# Patient Record
Sex: Male | Born: 1955 | Race: White | Hispanic: No | Smoking: Never smoker
Health system: Southern US, Community
[De-identification: ages and names within clinical notes are randomized; demographics above are authoritative.]

## PROBLEM LIST (undated history)

## (undated) DIAGNOSIS — Z87442 Personal history of urinary calculi: Secondary | ICD-10-CM

## (undated) DIAGNOSIS — E785 Hyperlipidemia, unspecified: Secondary | ICD-10-CM

## (undated) DIAGNOSIS — J449 Chronic obstructive pulmonary disease, unspecified: Secondary | ICD-10-CM

## (undated) DIAGNOSIS — I639 Cerebral infarction, unspecified: Secondary | ICD-10-CM

## (undated) DIAGNOSIS — R51 Headache: Secondary | ICD-10-CM

## (undated) DIAGNOSIS — R569 Unspecified convulsions: Secondary | ICD-10-CM

## (undated) DIAGNOSIS — I219 Acute myocardial infarction, unspecified: Secondary | ICD-10-CM

## (undated) DIAGNOSIS — I509 Heart failure, unspecified: Secondary | ICD-10-CM

## (undated) DIAGNOSIS — M1990 Unspecified osteoarthritis, unspecified site: Secondary | ICD-10-CM

## (undated) DIAGNOSIS — G4730 Sleep apnea, unspecified: Secondary | ICD-10-CM

## (undated) DIAGNOSIS — H9190 Unspecified hearing loss, unspecified ear: Secondary | ICD-10-CM

## (undated) DIAGNOSIS — K219 Gastro-esophageal reflux disease without esophagitis: Secondary | ICD-10-CM

## (undated) DIAGNOSIS — K279 Peptic ulcer, site unspecified, unspecified as acute or chronic, without hemorrhage or perforation: Secondary | ICD-10-CM

## (undated) DIAGNOSIS — D509 Iron deficiency anemia, unspecified: Secondary | ICD-10-CM

## (undated) DIAGNOSIS — I10 Essential (primary) hypertension: Secondary | ICD-10-CM

## (undated) DIAGNOSIS — R519 Headache, unspecified: Secondary | ICD-10-CM

## (undated) HISTORY — DX: Hyperlipidemia, unspecified: E78.5

## (undated) HISTORY — PX: KNEE SURGERY: SHX244

## (undated) HISTORY — PX: HERNIA REPAIR: SHX51

## (undated) HISTORY — PX: APPENDECTOMY: SHX54

## (undated) HISTORY — PX: FRACTURE SURGERY: SHX138

## (undated) HISTORY — DX: Chronic obstructive pulmonary disease, unspecified: J44.9

## (undated) HISTORY — DX: Peptic ulcer, site unspecified, unspecified as acute or chronic, without hemorrhage or perforation: K27.9

## (undated) HISTORY — PX: BRAIN SURGERY: SHX531

## (undated) HISTORY — DX: Iron deficiency anemia, unspecified: D50.9

## (undated) HISTORY — PX: ROTATOR CUFF REPAIR: SHX139

## (undated) HISTORY — PX: CEREBRAL ANEURYSM REPAIR: SHX164

---

## 2004-09-21 ENCOUNTER — Emergency Department (HOSPITAL_COMMUNITY): Admission: EM | Admit: 2004-09-21 | Discharge: 2004-09-21 | Payer: Self-pay | Admitting: Emergency Medicine

## 2004-09-26 ENCOUNTER — Ambulatory Visit (HOSPITAL_COMMUNITY): Admission: RE | Admit: 2004-09-26 | Discharge: 2004-09-26 | Payer: Self-pay | Admitting: *Deleted

## 2004-10-07 ENCOUNTER — Ambulatory Visit (HOSPITAL_COMMUNITY): Admission: RE | Admit: 2004-10-07 | Discharge: 2004-10-07 | Payer: Self-pay | Admitting: Orthopaedic Surgery

## 2004-10-14 ENCOUNTER — Ambulatory Visit (HOSPITAL_COMMUNITY): Admission: RE | Admit: 2004-10-14 | Discharge: 2004-10-14 | Payer: Self-pay | Admitting: Orthopaedic Surgery

## 2004-10-29 ENCOUNTER — Encounter (HOSPITAL_COMMUNITY): Admission: RE | Admit: 2004-10-29 | Discharge: 2004-11-28 | Payer: Self-pay | Admitting: *Deleted

## 2004-11-17 ENCOUNTER — Encounter (HOSPITAL_COMMUNITY): Admission: RE | Admit: 2004-11-17 | Discharge: 2004-12-17 | Payer: Self-pay | Admitting: *Deleted

## 2004-12-01 ENCOUNTER — Encounter (HOSPITAL_COMMUNITY): Admission: RE | Admit: 2004-12-01 | Discharge: 2005-01-01 | Payer: Self-pay | Admitting: Orthopaedic Surgery

## 2005-01-07 ENCOUNTER — Encounter (HOSPITAL_COMMUNITY): Admission: RE | Admit: 2005-01-07 | Discharge: 2005-02-06 | Payer: Self-pay | Admitting: Orthopaedic Surgery

## 2005-02-06 ENCOUNTER — Encounter (HOSPITAL_COMMUNITY): Admission: RE | Admit: 2005-02-06 | Discharge: 2005-03-08 | Payer: Self-pay | Admitting: Orthopaedic Surgery

## 2005-03-09 ENCOUNTER — Encounter (HOSPITAL_COMMUNITY): Admission: RE | Admit: 2005-03-09 | Discharge: 2005-04-08 | Payer: Self-pay | Admitting: Orthopaedic Surgery

## 2005-04-09 ENCOUNTER — Encounter (HOSPITAL_COMMUNITY): Admission: RE | Admit: 2005-04-09 | Discharge: 2005-05-09 | Payer: Self-pay | Admitting: Orthopaedic Surgery

## 2005-04-21 ENCOUNTER — Emergency Department (HOSPITAL_COMMUNITY): Admission: EM | Admit: 2005-04-21 | Discharge: 2005-04-21 | Payer: Self-pay | Admitting: Emergency Medicine

## 2005-05-29 ENCOUNTER — Ambulatory Visit (HOSPITAL_COMMUNITY): Admission: RE | Admit: 2005-05-29 | Discharge: 2005-05-29 | Payer: Self-pay | Admitting: Interventional Radiology

## 2005-06-10 ENCOUNTER — Ambulatory Visit (HOSPITAL_COMMUNITY): Admission: RE | Admit: 2005-06-10 | Discharge: 2005-06-10 | Payer: Self-pay | Admitting: Neurological Surgery

## 2005-07-26 ENCOUNTER — Emergency Department (HOSPITAL_COMMUNITY): Admission: EM | Admit: 2005-07-26 | Discharge: 2005-07-27 | Payer: Self-pay | Admitting: Emergency Medicine

## 2005-08-05 ENCOUNTER — Encounter (HOSPITAL_COMMUNITY): Admission: RE | Admit: 2005-08-05 | Discharge: 2005-09-04 | Payer: Self-pay | Admitting: Orthopaedic Surgery

## 2005-09-09 ENCOUNTER — Encounter (HOSPITAL_COMMUNITY): Admission: RE | Admit: 2005-09-09 | Discharge: 2005-10-03 | Payer: Self-pay | Admitting: Orthopaedic Surgery

## 2005-10-05 ENCOUNTER — Encounter (HOSPITAL_COMMUNITY): Admission: RE | Admit: 2005-10-05 | Discharge: 2005-11-04 | Payer: Self-pay | Admitting: Orthopaedic Surgery

## 2006-06-15 ENCOUNTER — Emergency Department (HOSPITAL_COMMUNITY): Admission: EM | Admit: 2006-06-15 | Discharge: 2006-06-15 | Payer: Self-pay | Admitting: Emergency Medicine

## 2006-06-21 ENCOUNTER — Encounter: Admission: RE | Admit: 2006-06-21 | Discharge: 2006-06-21 | Payer: Self-pay | Admitting: Neurological Surgery

## 2007-05-25 ENCOUNTER — Emergency Department (HOSPITAL_COMMUNITY): Admission: EM | Admit: 2007-05-25 | Discharge: 2007-05-26 | Payer: Self-pay | Admitting: Emergency Medicine

## 2009-07-07 ENCOUNTER — Emergency Department (HOSPITAL_COMMUNITY): Admission: EM | Admit: 2009-07-07 | Discharge: 2009-07-07 | Payer: Self-pay | Admitting: Emergency Medicine

## 2010-01-26 ENCOUNTER — Encounter: Payer: Self-pay | Admitting: Neurological Surgery

## 2010-05-23 NOTE — Op Note (Signed)
NAME:  Dustin Roberts, Dustin Roberts NO.:  [id number]   MEDICAL RECORD NO.:  [id number]          PATIENT TYPE:  AMB   LOCATION:  SDS                          FACILITY:  [facility]   PHYSICIAN:  Tennis Must Meyerdierks, M.D.DATE OF BIRTH:  1955/11/27   DATE OF PROCEDURE:  09/26/2004  DATE OF DISCHARGE:  09/26/2004                                 OPERATIVE REPORT   PREOP DIAGNOSIS:  Comminuted intra-articular fracture, left distal radius.   POSTOP DIAGNOSIS:  Comminuted intra-articular fracture, left distal radius.   PROCEDURE:  Open reduction internal fixation, left distal radius.   SURGEON:  Lowell Bouton, M.D.   ANESTHESIA:  General with axillary block.   OPERATIVE FINDINGS:  The patient had a comminuted fracture of the distal  radius that extended into the joint in multiple places.   PROCEDURE:  Under general anesthesia with a tourniquet on the left arm, the  left hand was prepped and draped in usual fashion.  After exsanguinating the  limb, the tourniquet was inflated to 250 mmHg. A longitudinal incision was  made over the volar radial aspect of the left wrist in line with the FCR  tendon. Sharp dissection was carried through the subcutaneous tissues and  bleeding points were coagulated. Sharp dissection was carried through the  FCR sheath and the tendon was retracted ulnarly. The floor of the sheath was  then incised longitudinally and blunt dissection was carried down to the FPL  tendon. This was retracted ulnarly with Weitlaner retractors. The pronator  quadratus was then incised longitudinally from its attachment on the radius.  It was elevated with a Glorious Peach off of the distal radius. The patient had been  placed in 10 pounds of traction at the end of the table and the fracture was  then reduced with the Therapist, nutritional. X-ray showed good position of the  fracture.  A DVR plate was then applied of the standard length but a wide  distal portion and the screws the  3.5 mm screw was placed in the slotted  hole. X-ray showed good length of the plate. The distal pegs were then  inserted using one threaded peg and the remainder none threaded. The  remainder of the proximal screws were then inserted in all the holes. The  traction had been released prior to the plate being applied and x-rays  showed excellent alignment of the fracture. The wound was then irrigated  copiously with saline and the pronator quadratus was reattached with 4-0  Vicryl.  A vessel loop drain was left in for drainage.  The  subcutaneous tissue was closed with 4-0 Vicryl and the skin with a 3-0  subcuticular Prolene. Steri-Strips were applied followed by sterile  dressings and a volar wrist splint. The patient tolerated the procedure well  and went to the recovery room awake and stable condition.      Lowell Bouton, M.D.  Electronically Signed     EMM/MEDQ  D:  09/26/2004  T:  09/27/2004  Job:  [id number]   cc:   Teola Bradley, M.D.  Fax: [phone]

## 2010-05-23 NOTE — Op Note (Signed)
NAMEMarland Kitchen  VIRLAN, KEMPKER NO.:  [id number]   MEDICAL RECORD NO.:  [id number]          PATIENT TYPE:  AMB   LOCATION:  SDS                          FACILITY:  [facility]   PHYSICIAN:  Dustin Roberts, M.D.DATE OF BIRTH:  04-13-1955   DATE OF PROCEDURE:  10/14/2004  DATE OF DISCHARGE:  10/14/2004                                 OPERATIVE REPORT   PREOPERATIVE DIAGNOSIS:  Left shoulder rotator cuff tear (supraspinatus and  subscapularis tendons).   POSTOPERATIVE DIAGNOSIS:  Left shoulder rotator cuff tear (supraspinatus and  subscapularis tendons).   PROCEDURE:  1.  Left shoulder open rotator cuff repair of supraspinatus and      subscapularis tendon tears.  2.  Left shoulder biceps tenotomy.   SURGEON:  Dustin Roberts, M.D.   ANESTHESIA:  1.  Intra scalene block, left shoulder,  2.  General anesthesia.   BLOOD LOSS:  Minimal.   COMPLICATIONS:  None.   ANTIBIOTICS:  1 gram IV Ancef.   INDICATIONS:  Dustin Roberts is a 55 year old gentleman who less than one month  ago fell off of a truck on the outstretched left wrist. He sustained a  comminuted distal radius fracture, that was fixed by one of my partners.  After the wrist was fixed, he noted that his shoulder was weak and was  exquisitely painful.  An MRI was obtained of the shoulder; it showed a full-  thickness supraspinatus tendon tear of left shoulder as well as a full-  thickness tear of the superior portion of the subscapularis tendon. There  was also noted medial subluxation of the long head of the biceps.  The MRI  then did not show any intraarticular pathology of the glenoid humeral joint  or the labrum.  Due to continued pain and weakness of the shoulder, I  recommended open rotator cuff repair, given the supraspinatus tendon then  the subscapularis tendon, as well.  We talked about the possibility of a  biceps tenodesis versus a tenotomy; and he, 54 years old, said that tenotomy  will  be fine with him. He was not interested he said in additional hardware  in his shoulder, and again I explained the risks, benefits of this to him.  These were well understood and agreed upon.   PROCEDURE DESCRIPTION:  After informed consent was obtained and appropriate  left arm and shoulder were marked, an interscalene block was obtained by  anesthesia and then Dustin Roberts brought to the operating room, placed supine  on the operating table. General anesthesia was obtained and then he was  maneuvered into a beach chair position; with appropriate straps and padding  to stabilize the head, as well as the nonoperative arm and the operative  arm.  I elected to not perform arthroscopy the shoulder, due to I do not  feel comfortable with pulling traction through his broken wrist; it was less  than a month old in terms of this fixation.  Given the fact there was no  glenohumeral pathology, I was going to proceed to the open portion of the  case.  The shoulder  was then prepped with DuraPrep and sterile drapes,  including sterile stockinette on the arm.  I then made an anterolateral  approach to the shoulder, starting at the level of the [facility] joint and carried  this just lateral.  The biceps muscle was exposed, and at the anterior  biceps between the anterior and lateral biceps, I did find a raphe and I was  able to take an interval down between these levels.  I made sure to not  extend the dissection past 5 cm on the lateral or the acromion to avoid the  axillary nerve.   Next, I assessed the undersurface of the acromion and the [facility] joint, and did  not find significant impingement of this area.  I did pass a small rasp up  underneath the acromion and the AC joint.  I flattened these areas out.  A  large bursal sac was noted as well, and  performed a bursectomy and then the  rotator cuff was assessed. There was a retracted large tear of the  supraspinatus, as well as the upper aspect of the  subscapularis tendon. The  could all be visualized through my dissection.  The biceps tendon was found  to be subluxated medially.  Due to gentle traction on the humerus, I was  able to open up the glenohumeral joint and could see the anchor of the long  head of biceps using a long blade.  I then took the biceps tendon off of the  sleeve, off of the superior aspect of the glenoid.  I then allowed this to  retract back down and then cut it additionally so it could retract without  any binding in the soft tissues.   Next, I cleaned an area on the lateral aspect of the shoulder, where the  supraspinatus tendon attachment was noted to be.  I cleaned this off with a  rasp; felt to get some good bleeding area.  I then prepared for a 5.5 mm  bioabsorbable screw placement with FiberWire suture.  This was first punched  with a hand drill, then tapped and the screw was placed without difficulty.  Using two separate strands of FiberWire, I went from back to front and then  did 2 separate horizontal mattress sutures in the supraspinatus tendon and  brought this over and to its footprint.  I then over sewed this with an  interrupted FiberWire suture, as well as a margin convergence suture between  the anterior and posterior portions of the cuff tear. There was then noted a  subscapularis tear; but it was not full length I was able to bring that back  over and repair that directly with a single drill hole and a FiberWire  suture as well. This was oversewn with FiberWire.  I put the shoulder  through a range of motion, and this was full the five then irrigated the  wound copiously and brought the biceps back over with a running loose 0  suture, followed by suture and subcutaneous tissue and staples on the skin.  Sterile dressing was applied; followed by a shoulder abduction sling. The  patient was awakened, extubated and taken to the recovery room in stable condition.  There were no complication's   I  will have him to continue to wear this sling, and we are going to process  of getting him in for physical therapy in the next a few days.           ______________________________  Dustin Panda.  Magnus Roberts, M.D.     CYB/MEDQ  D:  10/14/2004  T:  10/14/2004  Job:  [id number]

## 2010-10-23 LAB — CBC
HCT: 41.9
Hemoglobin: 14.7
MCHC: 35.1
MCV: 81.2
Platelets: 175
RBC: 5.16
RDW: 14.2 — ABNORMAL HIGH
WBC: 7.6

## 2010-10-23 LAB — BASIC METABOLIC PANEL
BUN: 20
CO2: 25
Calcium: 8.9
Chloride: 106
Creatinine, Ser: 0.85
GFR calc Af Amer: >60
GFR calc non Af Amer: >60
Glucose, Bld: 109 — ABNORMAL HIGH
Potassium: 3.7
Sodium: 136

## 2010-10-23 LAB — DIFFERENTIAL
Basophils Absolute: 0
Basophils Relative: 0
Eosinophils Absolute: 0.2
Eosinophils Relative: 3
Lymphocytes Relative: 10 — ABNORMAL LOW
Lymphs Abs: 0.7
Monocytes Absolute: 0.3
Monocytes Relative: 4
Neutro Abs: 6.3
Neutrophils Relative %: 83 — ABNORMAL HIGH

## 2010-10-23 LAB — URINALYSIS, ROUTINE W REFLEX MICROSCOPIC
Bilirubin Urine: NEGATIVE
Glucose, UA: NEGATIVE
Hgb urine dipstick: NEGATIVE
Ketones, ur: NEGATIVE
Nitrite: NEGATIVE
Protein, ur: NEGATIVE
Specific Gravity, Urine: >1.03 — ABNORMAL HIGH
Urobilinogen, UA: 0.2
pH: 5.5

## 2011-04-25 ENCOUNTER — Encounter (HOSPITAL_COMMUNITY): Payer: Self-pay | Admitting: *Deleted

## 2011-04-25 ENCOUNTER — Emergency Department (HOSPITAL_COMMUNITY): Payer: Medicaid Other

## 2011-04-25 ENCOUNTER — Emergency Department (HOSPITAL_COMMUNITY)
Admission: EM | Admit: 2011-04-25 | Discharge: 2011-04-25 | Disposition: A | Discharge status: Home / Self Care | Payer: Medicaid Other | Attending: Emergency Medicine | Admitting: Emergency Medicine

## 2011-04-25 DIAGNOSIS — E119 Type 2 diabetes mellitus without complications: Secondary | ICD-10-CM | POA: Insufficient documentation

## 2011-04-25 DIAGNOSIS — I509 Heart failure, unspecified: Secondary | ICD-10-CM | POA: Insufficient documentation

## 2011-04-25 DIAGNOSIS — R05 Cough: Secondary | ICD-10-CM | POA: Insufficient documentation

## 2011-04-25 DIAGNOSIS — T148XXA Other injury of unspecified body region, initial encounter: Secondary | ICD-10-CM | POA: Insufficient documentation

## 2011-04-25 DIAGNOSIS — M549 Dorsalgia, unspecified: Secondary | ICD-10-CM | POA: Insufficient documentation

## 2011-04-25 DIAGNOSIS — R059 Cough, unspecified: Secondary | ICD-10-CM | POA: Insufficient documentation

## 2011-04-25 DIAGNOSIS — R079 Chest pain, unspecified: Secondary | ICD-10-CM | POA: Insufficient documentation

## 2011-04-25 DIAGNOSIS — R509 Fever, unspecified: Secondary | ICD-10-CM | POA: Insufficient documentation

## 2011-04-25 DIAGNOSIS — J3489 Other specified disorders of nose and nasal sinuses: Secondary | ICD-10-CM | POA: Insufficient documentation

## 2011-04-25 DIAGNOSIS — Z8679 Personal history of other diseases of the circulatory system: Secondary | ICD-10-CM | POA: Insufficient documentation

## 2011-04-25 DIAGNOSIS — I10 Essential (primary) hypertension: Secondary | ICD-10-CM | POA: Insufficient documentation

## 2011-04-25 DIAGNOSIS — X58XXXA Exposure to other specified factors, initial encounter: Secondary | ICD-10-CM | POA: Insufficient documentation

## 2011-04-25 HISTORY — DX: Heart failure, unspecified: I50.9

## 2011-04-25 HISTORY — DX: Unspecified osteoarthritis, unspecified site: M19.90

## 2011-04-25 HISTORY — DX: Essential (primary) hypertension: I10

## 2011-04-25 HISTORY — DX: Cerebral infarction, unspecified: I63.9

## 2011-04-25 MED ORDER — HYDROCOD POLST-CHLORPHEN POLST 10-8 MG/5ML PO LQCR
5.0000 mL | Freq: Once | ORAL | Status: AC
  Administered 2011-04-25: 5 mL via ORAL
  Filled 2011-04-25: qty 5

## 2011-04-25 MED ORDER — IPRATROPIUM BROMIDE 0.02 % IN SOLN: 500.0000 ug | Freq: Four times a day (QID) | RESPIRATORY_TRACT | Status: DC

## 2011-04-25 MED ORDER — GUAIFENESIN-CODEINE 100-10 MG/5ML PO SYRP: ORAL_SOLUTION | ORAL | Status: DC

## 2011-04-25 MED ORDER — ALBUTEROL SULFATE (5 MG/ML) 0.5% IN NEBU
2.5000 mg | INHALATION_SOLUTION | Freq: Once | RESPIRATORY_TRACT | Status: AC
  Administered 2011-04-25: 2.5 mg via RESPIRATORY_TRACT
  Filled 2011-04-25: qty 0.5

## 2011-04-25 MED ORDER — IBUPROFEN 800 MG PO TABS
800.0000 mg | ORAL_TABLET | Freq: Once | ORAL | Status: AC
  Administered 2011-04-25: 800 mg via ORAL
  Filled 2011-04-25: qty 1

## 2011-04-25 MED ORDER — ALBUTEROL SULFATE (2.5 MG/3ML) 0.083% IN NEBU: 2.5000 mg | INHALATION_SOLUTION | Freq: Four times a day (QID) | RESPIRATORY_TRACT | Status: DC | PRN

## 2011-04-25 MED ORDER — IPRATROPIUM BROMIDE 0.02 % IN SOLN
0.5000 mg | Freq: Once | RESPIRATORY_TRACT | Status: AC
  Administered 2011-04-25: 0.5 mg via RESPIRATORY_TRACT
  Filled 2011-04-25: qty 2.5

## 2011-04-25 NOTE — ED Provider Notes (Signed)
History     CSN: [id number]  Arrival date & time 04/25/11  1141   First MD Initiated Contact with Patient 04/25/11 1207      Chief Complaint  Patient presents with  . Cough  . Nasal Congestion  . Back Pain    (Consider location/radiation/quality/duration/timing/severity/associated sxs/prior treatment) HPI Comments: States he's been coughing so much his chest and back hurt.  No leg swelling  Patient is a 56 y.o. male presenting with cough and back pain. The history is provided by the patient. No language interpreter was used.  Cough This is a new problem. The problem occurs every few minutes. The cough is non-productive. Maximum temperature: subjective fever and chills. Associated symptoms include chest pain and chills. Pertinent negatives include no sore throat, no shortness of breath and no wheezing. He is not a smoker.  Back Pain  Associated symptoms include chest pain and a fever.    Past Medical History  Diagnosis Date  . Arthritis   . CHF (congestive heart failure)   . Diabetes mellitus   . Hypertension   . Stroke     Past Surgical History  Procedure Date  . Appendectomy   . Fracture surgery     No family history on file.  History  Substance Use Topics  . Smoking status: Never Smoker   . Smokeless tobacco: Not on file  . Alcohol Use: No      Review of Systems  Constitutional: Positive for fever and chills.  HENT: Positive for congestion. Negative for sore throat.   Respiratory: Positive for cough. Negative for shortness of breath and wheezing.   Cardiovascular: Positive for chest pain. Negative for leg swelling.  Musculoskeletal: Positive for back pain.  All other systems reviewed and are negative.    Allergies  Review of patient's allergies indicates no known allergies.  Home Medications   Current Outpatient Rx  Name Route Sig Dispense Refill  . GUAIFENESIN-CODEINE 100-10 MG/5ML PO SYRP  10 mls q 4-6 hrs prn cough 240 mL 0    BP 131/68   Pulse 60  Temp(Src) 98 F (36.7 C) (Oral)  Resp 22  Ht 5\' 11"  (1.803 m)  Wt 220 lb (99.791 kg)  BMI 30.68 kg/m2  SpO2 97%  Physical Exam  Nursing note and vitals reviewed. Constitutional: He is oriented to person, place, and time. He appears well-developed and well-nourished.  HENT:  Head: Normocephalic and atraumatic.  Right Ear: External ear normal.  Left Ear: External ear normal.  Nose: Nose normal.  Eyes: EOM are normal.  Neck: Normal range of motion.  Cardiovascular: Normal rate, regular rhythm, S1 normal, S2 normal, normal heart sounds and intact distal pulses.  PMI is not displaced.  Exam reveals no distant heart sounds and no friction rub.   No murmur heard. Pulmonary/Chest: Effort normal and breath sounds normal. No accessory muscle usage. Not tachypneic. No respiratory distress. He has no decreased breath sounds. He has no wheezes. He has no rhonchi. He has no rales. He exhibits tenderness.    Abdominal: Soft. He exhibits no distension. There is no tenderness.  Musculoskeletal: Normal range of motion.       Arms: Neurological: He is alert and oriented to person, place, and time.  Skin: Skin is warm and dry.  Psychiatric: He has a normal mood and affect. Judgment normal.    ED Course  Procedures (including critical care time)  Labs Reviewed - No data to display Dg Chest 2 View  04/25/2011  *RADIOLOGY REPORT*  Clinical Data: 56 year old male with cough, congestion, fever, chest pain.  CHEST - 2 VIEW  Comparison: 05/25/2007.  Findings: Lung volumes are within normal limits.  Cardiac size and mediastinal contours are within normal limits.  Visualized tracheal air column is within normal limits.  No pneumothorax, pulmonary edema, pleural effusion or confluent pulmonary opacity. No acute osseous abnormality identified.  IMPRESSION: No acute cardiopulmonary abnormality.  Original Report Authenticated By: Harley Hallmark, M.D.     1. Cough   2. Muscle strain       MDM    X-rays are normal.  Doubt chf or other cardiac problems.  Doubt PE rx-robitussin AC OTC ibuprofen F/u with PCP in 2-3 days.        Worthy Rancher, PA 04/25/11 1359

## 2011-04-25 NOTE — ED Notes (Signed)
Pt DC to home with steady gait 

## 2011-04-25 NOTE — Discharge Instructions (Signed)
Cough, Adult  A cough is a reflex. It helps you clear your throat and airways. A cough can help heal your body. A cough can last 2 or 3 weeks (acute) or may last more than 8 weeks (chronic). Some common causes of a cough can include an infection, allergy, or a cold. HOME CARE  Only take medicine as told by your doctor.   If given, take your medicines (antibiotics) as told. Finish them even if you start to feel better.   Use a cold steam vaporizer or humidier in your home. This can help loosen thick spit (secretions).   Sleep so you are almost sitting up (semi-upright). Use pillows to do this. This helps reduce coughing.   Rest as needed.   Stop smoking if you smoke.  GET HELP RIGHT AWAY IF:  You have yellowish-white fluid (pus) in your thick spit.   Your cough gets worse.   Your medicine does not reduce coughing, and you are losing sleep.   You cough up blood.   You have trouble breathing.   Your pain gets worse and medicine does not help.   You have a fever.  MAKE SURE YOU:   Understand these instructions.   Will watch your condition.   Will get help right away if you are not doing well or get worse.  Document Released: 09/04/2010 Document Revised: 12/11/2010 Document Reviewed: 09/04/2010 [facility] Patient Information 2012 [place], [place].   Take the cough medicine as directed.  Take ibuprofen 800 mg every 8 hrs with food.  Follow up with your MD in 2-3 days.

## 2011-04-25 NOTE — ED Notes (Signed)
Pt taken to Xray.

## 2011-04-25 NOTE — ED Notes (Signed)
Pt presents with cough, chest wall pain with said cough, and low grade fever x 2 days. Pt denies SOB,

## 2011-04-27 NOTE — ED Provider Notes (Signed)
Medical screening examination/treatment/procedure(s) were performed by non-physician practitioner and as supervising physician I was immediately available for consultation/collaboration.  Deontae Robson, MD 04/27/11 0530 

## 2011-11-26 ENCOUNTER — Encounter (HOSPITAL_COMMUNITY): Payer: Self-pay | Admitting: Emergency Medicine

## 2011-11-26 ENCOUNTER — Emergency Department (HOSPITAL_COMMUNITY)
Admission: EM | Admit: 2011-11-26 | Discharge: 2011-11-26 | Disposition: A | Discharge status: Home / Self Care | Payer: Medicaid Other | Attending: Emergency Medicine | Admitting: Emergency Medicine

## 2011-11-26 DIAGNOSIS — M129 Arthropathy, unspecified: Secondary | ICD-10-CM | POA: Insufficient documentation

## 2011-11-26 DIAGNOSIS — E119 Type 2 diabetes mellitus without complications: Secondary | ICD-10-CM | POA: Insufficient documentation

## 2011-11-26 DIAGNOSIS — Z8673 Personal history of transient ischemic attack (TIA), and cerebral infarction without residual deficits: Secondary | ICD-10-CM | POA: Insufficient documentation

## 2011-11-26 DIAGNOSIS — Y929 Unspecified place or not applicable: Secondary | ICD-10-CM | POA: Insufficient documentation

## 2011-11-26 DIAGNOSIS — I10 Essential (primary) hypertension: Secondary | ICD-10-CM | POA: Insufficient documentation

## 2011-11-26 DIAGNOSIS — Y939 Activity, unspecified: Secondary | ICD-10-CM | POA: Insufficient documentation

## 2011-11-26 DIAGNOSIS — T1500XA Foreign body in cornea, unspecified eye, initial encounter: Secondary | ICD-10-CM | POA: Insufficient documentation

## 2011-11-26 DIAGNOSIS — H5789 Other specified disorders of eye and adnexa: Secondary | ICD-10-CM | POA: Insufficient documentation

## 2011-11-26 DIAGNOSIS — Z79899 Other long term (current) drug therapy: Secondary | ICD-10-CM | POA: Insufficient documentation

## 2011-11-26 DIAGNOSIS — I509 Heart failure, unspecified: Secondary | ICD-10-CM | POA: Insufficient documentation

## 2011-11-26 DIAGNOSIS — T1590XA Foreign body on external eye, part unspecified, unspecified eye, initial encounter: Secondary | ICD-10-CM | POA: Insufficient documentation

## 2011-11-26 DIAGNOSIS — T1501XA Foreign body in cornea, right eye, initial encounter: Secondary | ICD-10-CM

## 2011-11-26 DIAGNOSIS — H53149 Visual discomfort, unspecified: Secondary | ICD-10-CM | POA: Insufficient documentation

## 2011-11-26 MED ORDER — CIPROFLOXACIN HCL 0.3 % OP SOLN
1.0000 [drp] | OPHTHALMIC | Status: DC
  Administered 2011-11-26: 1 [drp] via OPHTHALMIC
  Filled 2011-11-26: qty 2.5

## 2011-11-26 MED ORDER — CIPROFLOXACIN HCL 0.3 % OP SOLN
OPHTHALMIC | Status: AC
  Filled 2011-11-26: qty 2.5

## 2011-11-26 MED ORDER — OXYCODONE-ACETAMINOPHEN 5-325 MG PO TABS: 1.0000 | ORAL_TABLET | ORAL | Status: DC | PRN

## 2011-11-26 MED ORDER — FLUORESCEIN SODIUM 1 MG OP STRP
ORAL_STRIP | OPHTHALMIC | Status: AC
  Administered 2011-11-26: 04:00:00
  Filled 2011-11-26: qty 1

## 2011-11-26 MED ORDER — TETRACAINE HCL 0.5 % OP SOLN
OPHTHALMIC | Status: AC
  Administered 2011-11-26: 04:00:00
  Filled 2011-11-26: qty 2

## 2011-11-26 NOTE — ED Provider Notes (Signed)
History     CSN: [id number]  Arrival date & time 11/26/11  0234   First MD Initiated Contact with Patient 11/26/11 [phone]      Chief Complaint  Patient presents with  . Eye Problem    (Consider location/radiation/quality/duration/timing/severity/associated sxs/prior treatment) HPI Comments: 56 year old male who states that he started having right eye pain last night. Over the course of the day he has had increased burning and sensitivity to light. This is persistent, gradually worsening, not associated with headache, blurred vision, fevers. States that he has been rubbing his eye but denies any foreign bodies, denies contact lenses  Patient is a 56 y.o. male presenting with eye problem. The history is provided by the patient.  Eye Problem  Associated symptoms include photophobia and eye redness.    Past Medical History  Diagnosis Date  . Arthritis   . CHF (congestive heart failure)   . Diabetes mellitus   . Hypertension   . Stroke     Past Surgical History  Procedure Date  . Appendectomy   . Fracture surgery     No family history on file.  History  Substance Use Topics  . Smoking status: Never Smoker   . Smokeless tobacco: Not on file  . Alcohol Use: No      Review of Systems  Constitutional: Negative for fever.  Eyes: Positive for photophobia, pain and redness.    Allergies  Review of patient's allergies indicates no known allergies.  Home Medications   Current Outpatient Rx  Name  Route  Sig  Dispense  Refill  . ALBUTEROL SULFATE (2.5 MG/3ML) 0.083% IN NEBU   Nebulization   Take 3 mLs (2.5 mg total) by nebulization every 6 (six) hours as needed for wheezing.   75 mL   1   . GUAIFENESIN-CODEINE 100-10 MG/5ML PO SYRP      10 mls q 4-6 hrs prn cough   240 mL   0   . IPRATROPIUM BROMIDE 0.02 % IN SOLN   Nebulization   Take 2.5 mLs (500 mcg total) by nebulization 4 (four) times daily.   75 mL   1   . OXYCODONE-ACETAMINOPHEN 5-325 MG PO TABS  Oral   Take 1 tablet by mouth every 4 (four) hours as needed for pain.   20 tablet   0     BP 139/97  Pulse 63  Temp 97.4 F (36.3 C) (Oral)  Resp 18  Ht 5' 11.5" (1.816 m)  Wt 217 lb (98.431 kg)  BMI 29.84 kg/m2  SpO2 97%  Physical Exam  Nursing note and vitals reviewed. Constitutional: He appears well-developed and well-nourished. No distress.  HENT:  Head: Normocephalic and atraumatic.  Eyes: EOM are normal. Pupils are equal, round, and reactive to light. Right eye exhibits no discharge. Left eye exhibits no discharge. No scleral icterus.       On fluorescein and tetracaine exam the patient has a foreign body in the right eye on the cornea nasal to the pupil. There is normal extraocular movements, no discharge, mild erythema of the conjunctiva. The intraocular pressure of the right eye is 14  Cardiovascular: Normal rate, regular rhythm and normal heart sounds.   Pulmonary/Chest: Effort normal and breath sounds normal.  Neurological: He is alert.  Skin: Skin is warm and dry.    ED Course  Procedures (including critical care time)  Labs Reviewed - No data to display No results found.   1. Foreign body of right cornea  MDM  Overall the patient is well-appearing, he has normal vital signs, he does have a foreign body in the right eye. After tetracaine this foreign body was unable to be removed with Q-tip or 25-gauge needle. He has been referred to ophthalmology as an outpatient to be seen tomorrow. Ciloxan drops given prior to discharge. She has expressed his understanding for the indications for return and the need for close followup.        Vida Roller, MD 11/26/11 [phone]

## 2011-11-26 NOTE — ED Notes (Signed)
States he started having right eye pain last night.  States he started having burning and sensitivity to light today.  States he is unable to keep his right eye open.

## 2013-08-09 ENCOUNTER — Other Ambulatory Visit (HOSPITAL_COMMUNITY): Payer: Self-pay

## 2013-08-09 DIAGNOSIS — R0683 Snoring: Secondary | ICD-10-CM

## 2013-08-09 DIAGNOSIS — G4730 Sleep apnea, unspecified: Secondary | ICD-10-CM

## 2013-08-20 ENCOUNTER — Ambulatory Visit: Payer: Medicaid Other | Attending: Family | Admitting: Sleep Medicine

## 2013-08-20 VITALS — Ht 71.0 in | Wt 216.0 lb

## 2013-08-20 DIAGNOSIS — G4730 Sleep apnea, unspecified: Secondary | ICD-10-CM

## 2013-08-20 DIAGNOSIS — G4733 Obstructive sleep apnea (adult) (pediatric): Secondary | ICD-10-CM | POA: Diagnosis not present

## 2013-08-20 DIAGNOSIS — R0683 Snoring: Secondary | ICD-10-CM

## 2013-08-20 DIAGNOSIS — G4710 Hypersomnia, unspecified: Secondary | ICD-10-CM | POA: Diagnosis present

## 2013-08-21 DIAGNOSIS — Q2730 Arteriovenous malformation, site unspecified: Secondary | ICD-10-CM | POA: Insufficient documentation

## 2013-08-28 NOTE — Sleep Study (Signed)
  [facility] Gilmore List A. Gerilyn Pilgrim, MD     [email or web address]        NOCTURNAL POLYSOMNOGRAM    LOCATION: SLEEP LAB FACILITY: [place]   PHYSICIAN: Sharonlee Nine A. Gerilyn Pilgrim, M.D.   DATE OF STUDY: 08/20/2013.   REFERRING PHYSICIAN: Herbert Deaner, FNP.  INDICATIONS: This is 58 year old who presents with marked hypersomnia, fatigue and snoring. There is also restless sleep.  MEDICATIONS:  Prior to Admission medications   Medication Sig Start Date End Date Taking? Authorizing Provider  albuterol (PROVENTIL) (2.5 MG/3ML) 0.083% nebulizer solution Take 3 mLs (2.5 mg total) by nebulization every 6 (six) hours as needed for wheezing. 04/25/11 04/24/12  Evalina Field, PA-C  guaiFENesin-codeine [facility]) 100-10 MG/5ML syrup 10 mls q 4-6 hrs prn cough 04/25/11   Richard Hyacinth Meeker, PA-C  ipratropium (ATROVENT) 0.02 % nebulizer solution Take 2.5 mLs (500 mcg total) by nebulization 4 (four) times daily. 04/25/11 04/24/12  Evalina Field, PA-C  oxyCODONE-acetaminophen (PERCOCET) 5-325 MG per tablet Take 1 tablet by mouth every 4 (four) hours as needed for pain. 11/26/11   Vida Roller, MD      EPWORTH SLEEPINESS SCALE: 16.   BMI: 30.   ARCHITECTURAL SUMMARY: The study had to be discontinued about 80% of the typical completion time because the patient developed significant nausea and wanted the study discontinued. Total recording time was 251 minutes. Sleep efficiency 85 %. Sleep latency 13 minutes. REM latency 57 minutes. Stage NI 9 %, N2 47 % and N3 32 % and REM sleep 12 %.    RESPIRATORY DATA:  Baseline oxygen saturation is 94 %. The lowest saturation is 86 %. The diagnostic AHI is 8. The RDI is 12. The REM AHI is 9.  LIMB MOVEMENT SUMMARY: PLM index 11.   ELECTROCARDIOGRAM SUMMARY: Average heart rate is 66 with no significant dysrhythmias observed.   IMPRESSION:  1. This study is somewhat limited as only about 80% of the typical recording time was obtained. However, what is  obtained demonstrates mild obstructive sleep apnea syndrome that required positive pressure treatment. 2. 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  Thanks for this referral.  Marylon Verno A. Gerilyn Pilgrim, M.D. Diplomat, Biomedical engineer of Sleep Medicine.

## 2014-06-27 ENCOUNTER — Encounter: Payer: Self-pay | Admitting: Internal Medicine

## 2014-07-10 ENCOUNTER — Encounter: Payer: Self-pay | Admitting: Gastroenterology

## 2014-07-20 ENCOUNTER — Ambulatory Visit: Payer: Medicaid Other | Admitting: Gastroenterology

## 2014-08-01 ENCOUNTER — Encounter: Payer: Self-pay | Admitting: Gastroenterology

## 2014-08-01 ENCOUNTER — Telehealth: Payer: Self-pay | Admitting: Gastroenterology

## 2014-08-01 ENCOUNTER — Ambulatory Visit: Payer: Medicaid Other | Admitting: Gastroenterology

## 2014-08-01 NOTE — Telephone Encounter (Signed)
PATIENT WAS A NO SHOW AND LETTER SENT  °

## 2014-12-19 ENCOUNTER — Encounter (INDEPENDENT_AMBULATORY_CARE_PROVIDER_SITE_OTHER): Payer: Self-pay | Admitting: *Deleted

## 2015-01-11 ENCOUNTER — Emergency Department (HOSPITAL_COMMUNITY)
Admission: EM | Admit: 2015-01-11 | Discharge: 2015-01-11 | Disposition: A | Discharge status: Home / Self Care | Payer: Medicaid Other | Attending: Emergency Medicine | Admitting: Emergency Medicine

## 2015-01-11 ENCOUNTER — Encounter (HOSPITAL_COMMUNITY): Payer: Self-pay | Admitting: *Deleted

## 2015-01-11 DIAGNOSIS — Z8673 Personal history of transient ischemic attack (TIA), and cerebral infarction without residual deficits: Secondary | ICD-10-CM | POA: Insufficient documentation

## 2015-01-11 DIAGNOSIS — Z79899 Other long term (current) drug therapy: Secondary | ICD-10-CM | POA: Insufficient documentation

## 2015-01-11 DIAGNOSIS — Z8739 Personal history of other diseases of the musculoskeletal system and connective tissue: Secondary | ICD-10-CM | POA: Diagnosis not present

## 2015-01-11 DIAGNOSIS — H9211 Otorrhea, right ear: Secondary | ICD-10-CM | POA: Diagnosis present

## 2015-01-11 DIAGNOSIS — H7291 Unspecified perforation of tympanic membrane, right ear: Secondary | ICD-10-CM

## 2015-01-11 DIAGNOSIS — I509 Heart failure, unspecified: Secondary | ICD-10-CM | POA: Diagnosis not present

## 2015-01-11 DIAGNOSIS — Z7984 Long term (current) use of oral hypoglycemic drugs: Secondary | ICD-10-CM | POA: Diagnosis not present

## 2015-01-11 DIAGNOSIS — Z794 Long term (current) use of insulin: Secondary | ICD-10-CM | POA: Insufficient documentation

## 2015-01-11 DIAGNOSIS — I10 Essential (primary) hypertension: Secondary | ICD-10-CM | POA: Insufficient documentation

## 2015-01-11 DIAGNOSIS — E119 Type 2 diabetes mellitus without complications: Secondary | ICD-10-CM | POA: Insufficient documentation

## 2015-01-11 MED ORDER — AMOXICILLIN 500 MG PO CAPS: 500.0000 mg | ORAL_CAPSULE | Freq: Two times a day (BID) | ORAL | Status: DC

## 2015-01-11 MED ORDER — OXYCODONE-ACETAMINOPHEN 5-325 MG PO TABS
1.0000 | ORAL_TABLET | Freq: Once | ORAL | Status: AC
  Administered 2015-01-11: 1 via ORAL

## 2015-01-11 MED ORDER — OXYCODONE-ACETAMINOPHEN 5-325 MG PO TABS
ORAL_TABLET | ORAL | Status: AC
  Filled 2015-01-11: qty 1

## 2015-01-11 MED ORDER — AMOXICILLIN 250 MG PO CAPS
500.0000 mg | ORAL_CAPSULE | Freq: Once | ORAL | Status: AC
  Administered 2015-01-11: 500 mg via ORAL
  Filled 2015-01-11: qty 2

## 2015-01-11 MED ORDER — IBUPROFEN 400 MG PO TABS
600.0000 mg | ORAL_TABLET | Freq: Once | ORAL | Status: AC
  Administered 2015-01-11: 600 mg via ORAL
  Filled 2015-01-11: qty 2

## 2015-01-11 NOTE — ED Notes (Addendum)
Pt c/o cough, congestion, and sneezing x 2 weeks. Pt also c/o right ear draining since yesterday that has been accompanied with chills and low grade fever. Pt states he can't hear anything out of his right ear.

## 2015-01-11 NOTE — ED Provider Notes (Signed)
CSN: [id number]     Arrival date & time 01/11/15  2000 History   First MD Initiated Contact with Patient 01/11/15 2022     Chief Complaint  Patient presents with  . Ear Drainage      HPI Patient has had upper rest or symptoms of the past 2 weeks and he noted that yesterday began having some drainage from his right ear.  He's had some chills without fever.  He reports decreased hearing in his right ear.  He denies injury or trauma to his right ear.  He denies having ear pain prior to the drainage beginning.  He denies pain -0.  No headaches.   Past Medical History  Diagnosis Date  . Arthritis   . CHF (congestive heart failure) (HCC)   . Diabetes mellitus   . Hypertension   . Stroke [facility])    Past Surgical History  Procedure Laterality Date  . Appendectomy    . Fracture surgery      left wrist  . Brain surgery     No family history on file. Social History  Substance Use Topics  . Smoking status: Never Smoker   . Smokeless tobacco: None  . Alcohol Use: No    Review of Systems  All other systems reviewed and are negative.     Allergies  Review of patient's allergies indicates no known allergies.  Home Medications   Prior to Admission medications   Medication Sig Start Date End Date Taking? Authorizing Provider  ipratropium (ATROVENT) 0.02 % nebulizer solution Take 2.5 mLs (500 mcg total) by nebulization 4 (four) times daily. 04/25/11 01/11/15 Yes Richard Paul Half, PA-C  ADVAIR DISKUS 250-50 MCG/DOSE AEPB Inhale 1 puff into the lungs 2 (two) times daily. 12/17/14   Historical Provider, MD  albuterol (PROVENTIL) (2.5 MG/3ML) 0.083% nebulizer solution Take 3 mLs (2.5 mg total) by nebulization every 6 (six) hours as needed for wheezing. 04/25/11 04/24/12  Worthy Rancher, PA-C  amoxicillin (AMOXIL) 500 MG capsule Take 1 capsule (500 mg total) by mouth 2 (two) times daily. 01/11/15   Azalia Bilis, MD  LANTUS 100 UNIT/ML injection Inject 50 Units into the skin daily. 12/17/14    Historical Provider, MD  lisinopril (PRINIVIL,ZESTRIL) 40 MG tablet Take 40 mg by mouth daily. 12/17/14   Historical Provider, MD  metFORMIN (GLUCOPHAGE) 1000 MG tablet Take 1,000 mg by mouth 2 (two) times daily. 01/01/15   Historical Provider, MD  NOVOLOG 100 UNIT/ML injection INJECT 9 UNITS WITH MEALS SQ TID 12/17/14   Historical Provider, MD  pantoprazole (PROTONIX) 40 MG tablet Take 40 mg by mouth daily. 01/01/15   Historical Provider, MD  simvastatin (ZOCOR) 40 MG tablet Take 40 mg by mouth daily. 01/01/15   Historical Provider, MD   BP 165/82 mmHg  Pulse 94  Temp(Src) 99.4 F (37.4 C) (Oral)  Resp 20  Ht 5\' 11"  (1.803 m)  Wt 227 lb (102.967 kg)  BMI 31.67 kg/m2  SpO2 97% Physical Exam  Constitutional: He is oriented to person, place, and time. He appears well-developed and well-nourished.  HENT:  Head: Normocephalic.  Left TM is normal.  Rupture of his right TM.  Right external auditory canal is without swelling or redness.  No mastoid tenderness.  There is crusted drainage on the outside of his right ear which may have represented purulent drainage.  Eyes: EOM are normal.  Neck: Normal range of motion.  Pulmonary/Chest: Effort normal.  Abdominal: He exhibits no distension.  Musculoskeletal: Normal range of  motion.  Neurological: He is alert and oriented to person, place, and time.  Psychiatric: He has a normal mood and affect.  Nursing note and vitals reviewed.   ED Course  Procedures (including critical care time) Labs Review Labs Reviewed - No data to display  Imaging Review No results found. I have personally reviewed and evaluated these images and lab results as part of my medical decision-making.   EKG Interpretation None      MDM   Final diagnoses:  Ruptured tympanic membrane, right    Patient be started on amoxicillin.  No signs of mastoiditis at this time.  Nontoxic-appearing.  I suspect he had a developing right ear infection which now has ruptured.   He's been given ruptured TM precautions.  ENT follow-up.  He understands to not submerge his right ear and to wear an earplug when showering.    Azalia BilisKevin Riane Rung, MD 01/11/15 2042

## 2015-01-11 NOTE — Discharge Instructions (Signed)
Eardrum Perforation  An eardrum perforation is a puncture or tear in the eardrum. This is also called a ruptured eardrum. The eardrum is a thin, round tissue inside of your ear that separates your ear canal from your middle ear. This is the tissue that detects sound and enables you to hear. An eardrum perforation can cause discomfort and hearing loss. In most cases, the eardrum will heal without treatment and with little or no permanent hearing loss.  CAUSES  An eardrum perforation can result from different causes, including:  · Sudden pressure changes that happen in situations such as scuba diving or flying in an airplane.  · Foreign objects in the ear.  · Inserting a cotton-tipped swab or any blunt object into the ear.  · Loud noise.  · Trauma to the ear.  · Attempting to remove an object from the ear.  SIGNS AND SYMPTOMS  · Hearing loss.  · Ear pain.  · Ringing in the ear.  · Discharge or bleeding from the ear.  · Dizziness.  · Vomiting.  · Facial paralysis.  DIAGNOSIS   Your health care provider will examine your ear using a tool called an otoscope. This tool allows the health care provider to see into your ear to examine your eardrum. Various types of hearing tests may also be done.  TREATMENT   Typically, the eardrum will heal on its own within a few weeks. If your eardrum does not heal, your health care provider may recommend one of the following treatments:  · A procedure to place a patch over your eardrum.  · Surgery to repair your eardrum.  HOME CARE INSTRUCTIONS   · Keep your ear dry. This will improve healing. Do not submerge your head under water until healing is complete. Do not swim or dive until your health care provider approves. While bathing or showering, protect your ear using one of these methods:    Using a waterproof earplug.    Covering a piece of cotton with petroleum jelly and placing it in your outer ear canal.  · Take medicines only as directed by your health care provider.  · Avoid  blowing your nose if possible. If you blow your nose, do it gently. Forceful blowing increases the pressure in your middle ear. This may cause further injury or may delay your healing.  · Resume your normal activities after the perforation has healed. Your health care provider can tell you when this has occurred.  · Talk to your health care provider before you fly on an airplane. Air travel is generally allowed with a perforated eardrum.  · Keep all follow-up visits as directed by your health care provider. This is important.  SEEK MEDICAL CARE IF:  · You have a fever.  SEEK IMMEDIATE MEDICAL CARE IF:  · You have blood or pus coming from your ear.  · You have dizziness or problems with balance.  · You have nausea or vomiting.  · You have increased pain.     This information is not intended to replace advice given to you by your health care provider. Make sure you discuss any questions you have with your health care provider.     Document Released: 12/20/1999 Document Revised: 01/12/2014 Document Reviewed: 07/31/2013  Elsevier Interactive Patient Education ©2016 [employer]

## 2015-02-11 ENCOUNTER — Emergency Department (HOSPITAL_COMMUNITY)
Admission: EM | Admit: 2015-02-11 | Discharge: 2015-02-12 | Disposition: A | Discharge status: Home / Self Care | Payer: Medicaid Other | Attending: Emergency Medicine | Admitting: Emergency Medicine

## 2015-02-11 ENCOUNTER — Emergency Department (HOSPITAL_COMMUNITY): Payer: Medicaid Other

## 2015-02-11 ENCOUNTER — Encounter (HOSPITAL_COMMUNITY): Payer: Self-pay | Admitting: Emergency Medicine

## 2015-02-11 DIAGNOSIS — Z7984 Long term (current) use of oral hypoglycemic drugs: Secondary | ICD-10-CM | POA: Insufficient documentation

## 2015-02-11 DIAGNOSIS — Z8673 Personal history of transient ischemic attack (TIA), and cerebral infarction without residual deficits: Secondary | ICD-10-CM | POA: Diagnosis not present

## 2015-02-11 DIAGNOSIS — E785 Hyperlipidemia, unspecified: Secondary | ICD-10-CM | POA: Diagnosis not present

## 2015-02-11 DIAGNOSIS — J159 Unspecified bacterial pneumonia: Secondary | ICD-10-CM | POA: Diagnosis not present

## 2015-02-11 DIAGNOSIS — M1990 Unspecified osteoarthritis, unspecified site: Secondary | ICD-10-CM | POA: Insufficient documentation

## 2015-02-11 DIAGNOSIS — Z79899 Other long term (current) drug therapy: Secondary | ICD-10-CM | POA: Diagnosis not present

## 2015-02-11 DIAGNOSIS — H9190 Unspecified hearing loss, unspecified ear: Secondary | ICD-10-CM | POA: Diagnosis not present

## 2015-02-11 DIAGNOSIS — I509 Heart failure, unspecified: Secondary | ICD-10-CM | POA: Insufficient documentation

## 2015-02-11 DIAGNOSIS — H9201 Otalgia, right ear: Secondary | ICD-10-CM | POA: Insufficient documentation

## 2015-02-11 DIAGNOSIS — R509 Fever, unspecified: Secondary | ICD-10-CM | POA: Diagnosis present

## 2015-02-11 DIAGNOSIS — R197 Diarrhea, unspecified: Secondary | ICD-10-CM | POA: Diagnosis not present

## 2015-02-11 DIAGNOSIS — E119 Type 2 diabetes mellitus without complications: Secondary | ICD-10-CM | POA: Insufficient documentation

## 2015-02-11 DIAGNOSIS — I10 Essential (primary) hypertension: Secondary | ICD-10-CM | POA: Diagnosis not present

## 2015-02-11 DIAGNOSIS — Z794 Long term (current) use of insulin: Secondary | ICD-10-CM | POA: Diagnosis not present

## 2015-02-11 DIAGNOSIS — J189 Pneumonia, unspecified organism: Secondary | ICD-10-CM

## 2015-02-11 LAB — COMPREHENSIVE METABOLIC PANEL
ALBUMIN: 3.9 g/dL (ref 3.5–5.0)
ALT: 41 U/L (ref 17–63)
AST: 36 U/L (ref 15–41)
Alkaline Phosphatase: 77 U/L (ref 38–126)
Anion gap: 11 (ref 5–15)
BUN: 20 mg/dL (ref 6–20)
CHLORIDE: 103 mmol/L (ref 101–111)
CO2: 22 mmol/L (ref 22–32)
CREATININE: 0.99 mg/dL (ref 0.61–1.24)
Calcium: 9.4 mg/dL (ref 8.9–10.3)
GFR calc Af Amer: >60 mL/min (ref >60)
GLUCOSE: 171 mg/dL — AB (ref 65–99)
POTASSIUM: 4.1 mmol/L (ref 3.5–5.1)
Sodium: 136 mmol/L (ref 135–145)
Total Bilirubin: 0.7 mg/dL (ref 0.3–1.2)
Total Protein: 7.7 g/dL (ref 6.5–8.1)

## 2015-02-11 LAB — CBC WITH DIFFERENTIAL/PLATELET
Basophils Absolute: 0 10*3/uL (ref 0.0–0.1)
Basophils Relative: 1 %
EOS ABS: 0 10*3/uL (ref 0.0–0.7)
EOS PCT: 0 %
HCT: 38.2 % — ABNORMAL LOW (ref 39.0–52.0)
Hemoglobin: 12.5 g/dL — ABNORMAL LOW (ref 13.0–17.0)
LYMPHS ABS: 1.6 10*3/uL (ref 0.7–4.0)
LYMPHS PCT: 20 %
MCH: 25.7 pg — AB (ref 26.0–34.0)
MCHC: 32.7 g/dL (ref 30.0–36.0)
MCV: 78.6 fL (ref 78.0–100.0)
MONO ABS: 0.8 10*3/uL (ref 0.1–1.0)
MONOS PCT: 11 %
Neutro Abs: 5.4 10*3/uL (ref 1.7–7.7)
Neutrophils Relative %: 68 %
PLATELETS: 170 10*3/uL (ref 150–400)
RBC: 4.86 MIL/uL (ref 4.22–5.81)
RDW: 13.4 % (ref 11.5–15.5)
WBC: 7.8 10*3/uL (ref 4.0–10.5)

## 2015-02-11 LAB — URINALYSIS, ROUTINE W REFLEX MICROSCOPIC
GLUCOSE, UA: NEGATIVE mg/dL
HGB URINE DIPSTICK: NEGATIVE
Ketones, ur: NEGATIVE mg/dL
LEUKOCYTES UA: NEGATIVE
Nitrite: NEGATIVE
PH: 5.5 (ref 5.0–8.0)
Protein, ur: 30 mg/dL — AB

## 2015-02-11 LAB — LACTIC ACID, PLASMA: LACTIC ACID, VENOUS: 1.3 mmol/L (ref 0.5–2.0)

## 2015-02-11 LAB — URINE MICROSCOPIC-ADD ON

## 2015-02-11 MED ORDER — ACETAMINOPHEN 500 MG PO TABS
1000.0000 mg | ORAL_TABLET | Freq: Once | ORAL | Status: AC
  Administered 2015-02-11: 1000 mg via ORAL
  Filled 2015-02-11: qty 2

## 2015-02-11 NOTE — ED Provider Notes (Signed)
CSN: [id number]     Arrival date & time 02/11/15  2029 History  By signing my name below, I, [facility], attest that this documentation has been prepared under the direction and in the presence of Devoria Albe, MD at 2307. Electronically Signed: Randell Patient, ED Scribe. 02/11/2015. 2:34 AM.   Chief Complaint  Patient presents with  . Fever   The history is provided by the patient. No language interpreter was used.   HPI Comments: Dustin Roberts is a 60 y.o. male with an PMHx of stroke, DM, HTN, HLN, and CHF and a PSHx of brain surgery who presents to the Emergency Department complaining of a constant, moderate, unchanging fever with a TMAX 102.3 onset 2 yesterday. Patient endorses associated fevers, chills, mild dry cough for 1 day, diaphoresis, diarrhea (once PTA), body aches, right ear pain, and HA to left occipitus. He reports similar HAs in the past but states current episode is greater in severity. He notes recent sick contact with granddaughter who was diagnosed with URI and had a similar cough. Per pt, he was seen 6 weeks ago where he was diagnosed with a ruptured eardrum and had umbilical and left inguinal hernia repair 1 month ago. Denies smoking and EtOH use. Denies rhinorrhea, sore throat, CP, abdominal pain, nausea, vomiting, frequency, and dysuria patient states he did have a flu shot this season.  PCP: [facility]  Past Medical History  Diagnosis Date  . Arthritis   . CHF (congestive heart failure) (HCC)   . Diabetes mellitus   . Hypertension   . Stroke [facility])    Past Surgical History  Procedure Laterality Date  . Appendectomy    . Fracture surgery      left wrist  . Brain surgery     History reviewed. No pertinent family history. Social History  Substance Use Topics  . Smoking status: Never Smoker   . Smokeless tobacco: None  . 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  HENT: Positive for ear pain. Negative for rhinorrhea and sore throat.   Cardiovascular: Negative for chest pain.  Gastrointestinal: Positive for diarrhea. Negative for nausea, vomiting and abdominal pain.  Genitourinary: Negative for dysuria and frequency.  Neurological: Positive for headaches.  All other systems reviewed and are negative.     Allergies  Review of patient's allergies indicates no known allergies.  Home Medications   Prior to Admission medications   Medication Sig Start Date End Date Taking? Authorizing Provider  acetaminophen (TYLENOL) 500 MG tablet Take 500 mg by mouth every 6 (six) hours as needed for mild pain or moderate pain.   Yes Historical Provider, MD  ADVAIR DISKUS 250-50 MCG/DOSE AEPB Inhale 1 puff into the lungs 2 (two) times daily. 12/17/14  Yes Historical Provider, MD  insulin aspart (NOVOLOG FLEXPEN) 100 UNIT/ML FlexPen Inject 10 Units into the skin 3 (three) times daily before meals.   Yes Historical Provider, MD  Insulin Glargine (LANTUS SOLOSTAR) 100 UNIT/ML Solostar Pen Inject 50 Units into the skin every morning.   Yes Historical Provider, MD  lisinopril (PRINIVIL,ZESTRIL) 40 MG tablet Take 40 mg by mouth every evening.  12/17/14  Yes Historical Provider, MD  loratadine (ALLERGY RELIEF) 10 MG tablet Take 10 mg by mouth daily.  Yes Historical Provider, MD  metFORMIN (GLUCOPHAGE) 1000 MG tablet Take 1,000 mg by mouth 2 (two) times daily. 01/01/15  Yes Historical Provider, MD  pantoprazole (PROTONIX) 40 MG tablet Take 40 mg by mouth every evening.  01/01/15  Yes Historical Provider, MD  simvastatin (ZOCOR) 40 MG tablet Take 40 mg by mouth every evening.  01/01/15  Yes Historical Provider, MD  albuterol (PROVENTIL) (2.5 MG/3ML) 0.083% nebulizer solution Take 3 mLs (2.5 mg total) by nebulization every 6  (six) hours as needed for wheezing. Patient not taking: Reported on 02/11/2015 04/25/11 04/24/12  Worthy Rancher, PA-C  amoxicillin (AMOXIL) 500 MG capsule Take 1 capsule (500 mg total) by mouth 2 (two) times daily. Patient not taking: Reported on 02/11/2015 01/11/15   Azalia Bilis, MD  azithromycin (ZITHROMAX) 250 MG tablet Take 1 tablet (250 mg total) by mouth daily. 02/12/15   Devoria Albe, MD  ipratropium (ATROVENT) 0.02 % nebulizer solution Take 2.5 mLs (500 mcg total) by nebulization 4 (four) times daily. 04/25/11 01/11/15  Richard Paul Half, PA-C   BP 124/75 mmHg  Pulse 88  Temp(Src) 99.5 F (37.5 C) (Oral)  Resp 18  Ht 5' 11.5" (1.816 m)  Wt 170 lb (77.111 kg)  BMI 23.38 kg/m2  SpO2 95%  Vital signs normal   Physical Exam  Constitutional: He is oriented to person, place, and time. He appears well-developed and well-nourished.  Non-toxic appearance. He does not appear ill. No distress.  Hard of hearing  HENT:  Head: Normocephalic and atraumatic.  Right Ear: External ear normal.  Left Ear: External ear normal.  Nose: Nose normal. No mucosal edema or rhinorrhea.  Mouth/Throat: Oropharynx is clear and moist and mucous membranes are normal. No dental abscesses or uvula swelling.  Right ear TM normal. Do not see evidence of right TM rupture that pt states he had last month. Ear canal is normal.  Eyes: Conjunctivae and EOM are normal. Pupils are equal, round, and reactive to light.  Neck: Normal range of motion and full passive range of motion without pain. Neck supple.  Cardiovascular: Normal rate, regular rhythm and normal heart sounds.  Exam reveals no gallop and no friction rub.   No murmur heard. Pulmonary/Chest: Effort normal and breath sounds normal. No respiratory distress. He has no wheezes. He has no rhonchi. He has no rales. He exhibits no tenderness and no crepitus.  Abdominal: Soft. Normal appearance and bowel sounds are normal. He exhibits no distension. There is no tenderness.  There is no rebound and no guarding.  Musculoskeletal: Normal range of motion. He exhibits no edema or tenderness.  Moves all extremities well.   Neurological: He is alert and oriented to person, place, and time. He has normal strength. No cranial nerve deficit.  Skin: Skin is warm, dry and intact. No rash noted. No erythema. No pallor.  Feels hot.  Psychiatric: He has a normal mood and affect. His speech is normal and behavior is normal. His mood appears not anxious.  Nursing note and vitals reviewed.   ED Course  Procedures   Medications  azithromycin (ZITHROMAX) 500 mg in dextrose 5 % 250 mL IVPB (0 mg Intravenous Stopped 02/12/15 0135)  acetaminophen (TYLENOL) tablet 1,000 mg (1,000 mg Oral Given 02/11/15 2321)  cefTRIAXone (ROCEPHIN) 1 g in dextrose 5 % 50 mL IVPB (0 g Intravenous Stopped 02/12/15 0224)     DIAGNOSTIC STUDIES: Oxygen Saturation is 94% on RA, adequate by my interpretation.    COORDINATION OF CARE: 11:01 PM Will  order chest x-ray, labs, and medications. Discussed treatment plan with pt at bedside and pt agreed to plan.  Patient developed a low-grade fever which was checked after my exam at 100.3. He was treated with Tylenol.  After reviewing patient's chest x-ray he was given IV Rocephin and Zithromax for his community-acquired pneumonia.  Patient was ambulated by nursing staff and his pulse ox remained 95-99%.  Recheck at time of discharge after patient received his IV antibiotics, patient states he's feeling better, he feels able to go home and take oral antibiotics.  Labs Review Results for orders placed or performed during the hospital encounter of 02/11/15  Comprehensive metabolic panel  Result Value Ref Range   Sodium 136 135 - 145 mmol/L   Potassium 4.1 3.5 - 5.1 mmol/L   Chloride 103 101 - 111 mmol/L   CO2 22 22 - 32 mmol/L   Glucose, Bld 171 (H) 65 - 99 mg/dL   BUN 20 6 - 20 mg/dL   Creatinine, Ser 1.61 0.61 - 1.24 mg/dL   Calcium 9.4 8.9 - 09.6  mg/dL   Total Protein 7.7 6.5 - 8.1 g/dL   Albumin 3.9 3.5 - 5.0 g/dL   AST 36 15 - 41 U/L   ALT 41 17 - 63 U/L   Alkaline Phosphatase 77 38 - 126 U/L   Total Bilirubin 0.7 0.3 - 1.2 mg/dL   GFR calc non Af Amer >60 >60 mL/min   GFR calc Af Amer >60 >60 mL/min   Anion gap 11 5 - 15  CBC with Differential  Result Value Ref Range   WBC 7.8 4.0 - 10.5 K/uL   RBC 4.86 4.22 - 5.81 MIL/uL   Hemoglobin 12.5 (L) 13.0 - 17.0 g/dL   HCT 04.5 (L) 40.9 - 81.1 %   MCV 78.6 78.0 - 100.0 fL   MCH 25.7 (L) 26.0 - 34.0 pg   MCHC 32.7 30.0 - 36.0 g/dL   RDW 91.4 78.2 - 95.6 %   Platelets 170 150 - 400 K/uL   Neutrophils Relative % 68 %   Neutro Abs 5.4 1.7 - 7.7 K/uL   Lymphocytes Relative 20 %   Lymphs Abs 1.6 0.7 - 4.0 K/uL   Monocytes Relative 11 %   Monocytes Absolute 0.8 0.1 - 1.0 K/uL   Eosinophils Relative 0 %   Eosinophils Absolute 0.0 0.0 - 0.7 K/uL   Basophils Relative 1 %   Basophils Absolute 0.0 0.0 - 0.1 K/uL  Lactic acid, plasma  Result Value Ref Range   Lactic Acid, Venous 1.3 0.5 - 2.0 mmol/L  Urinalysis, Routine w reflex microscopic  Result Value Ref Range   Color, Urine AMBER (A) YELLOW   APPearance CLEAR CLEAR   Specific Gravity, Urine >1.030 (H) 1.005 - 1.030   pH 5.5 5.0 - 8.0   Glucose, UA NEGATIVE NEGATIVE mg/dL   Hgb urine dipstick NEGATIVE NEGATIVE   Bilirubin Urine SMALL (A) NEGATIVE   Ketones, ur NEGATIVE NEGATIVE mg/dL   Protein, ur 30 (A) NEGATIVE mg/dL   Nitrite NEGATIVE NEGATIVE   Leukocytes, UA NEGATIVE NEGATIVE  Urine microscopic-add on  Result Value Ref Range   Squamous Epithelial / LPF 0-5 (A) NONE SEEN   WBC, UA 0-5 0 - 5 WBC/hpf   RBC / HPF 0-5 0 - 5 RBC/hpf   Bacteria, UA FEW (A) NONE SEEN   Casts HYALINE CASTS (A) NEGATIVE   Urine-Other MUCOUS PRESENT    Laboratory interpretation all normal except concentrated urine  Imaging Review Dg Chest 2 View  02/11/2015  CLINICAL DATA:  Subacute onset of fever. Chills and mild cough. Diaphoresis  and diarrhea. Initial encounter. EXAM: CHEST  2 VIEW COMPARISON:  Chest radiograph performed 04/25/2011 FINDINGS: The lungs are well-aerated. Mild left basilar opacity is concerning for pneumonia. There is no evidence of pleural effusion or pneumothorax. The heart is normal in size; the mediastinal contour is within normal limits. No acute osseous abnormalities are seen. IMPRESSION: Mild left basilar pneumonia noted. Followup PA and lateral chest X-ray is recommended in 3-4 weeks following trial of antibiotic therapy to ensure resolution and exclude underlying malignancy. Electronically Signed   By: Roanna Raider M.D.   On: 02/11/2015 23:54   I have personally reviewed and evaluated these images and lab results as part of my medical decision-making.   EKG Interpretation   Date/Time:  Monday February 11 2015 20:38:26 EST Ventricular Rate:  107 PR Interval:  134 QRS Duration: 86 QT Interval:  314 QTC Calculation: 419 R Axis:   -27 Text Interpretation:  Sinus tachycardia Possible Left atrial enlargement  Since last tracing rate faster (26 Sep 2004) Confirmed by [facility]  MD-I, Noga Fogg  ([id number]) on 02/11/2015 11:03:47 PM      EKG Interpretation  Date/Time:  Monday February 11 2015 21:49:24 EST Ventricular Rate:  100 PR Interval:  141 QRS Duration: 92 QT Interval:  317 QTC Calculation: 409 R Axis:   -27 Text Interpretation:  Sinus tachycardia Left atrial enlargement Borderline left axis deviation No significant change since last tracing EARLIER SAME DATE Confirmed by Clydene Burack  MD-I, Salinda Snedeker ([id number]) on 02/11/2015 11:51:37 PM        MDM   Final diagnoses:  CAP (community acquired pneumonia)   New Prescriptions   AZITHROMYCIN (ZITHROMAX) 250 MG TABLET    Take 1 tablet (250 mg total) by mouth daily.    Plan discharge  Devoria Albe, MD, FACEP   I personally performed the services described in this documentation, which was scribed in my presence. The recorded information has been reviewed and  considered.  Devoria Albe, MD, Concha Pyo, MD 02/12/15 [phone]

## 2015-02-11 NOTE — ED Notes (Signed)
Pt c/o fever, weakness, rt ear pain and excessive sweating.

## 2015-02-12 MED ORDER — CEFTRIAXONE SODIUM 1 G IJ SOLR
1.0000 g | Freq: Once | INTRAMUSCULAR | Status: AC
  Administered 2015-02-12: 1 g via INTRAVENOUS
  Filled 2015-02-12: qty 10

## 2015-02-12 MED ORDER — DEXTROSE 5 % IV SOLN
500.0000 mg | INTRAVENOUS | Status: DC
  Administered 2015-02-12: 500 mg via INTRAVENOUS
  Filled 2015-02-12: qty 500

## 2015-02-12 MED ORDER — AZITHROMYCIN 250 MG PO TABS: 250.0000 mg | ORAL_TABLET | Freq: Every day | ORAL | Status: DC

## 2015-02-12 NOTE — ED Notes (Signed)
Pt states understanding of care given and follow up instructions.  Ambulated from ED  

## 2015-02-12 NOTE — Discharge Instructions (Signed)
Drink plenty of fluids. Take the zithromax until gone. You can take acetaminophen 1000 mg and/or motrin 600 mg every 6 hrs for fever and body aches. Recheck if you have uncontrolled vomiting, you struggle to breathe or seem worse.    Community-Acquired Pneumonia, Adult Pneumonia is an infection of the lungs. There are different types of pneumonia. One type can develop while a person is in a hospital. A different type, called community-acquired pneumonia, develops in people who are not, or have not recently been, in the hospital or other health care facility.  CAUSES Pneumonia may be caused by bacteria, viruses, or funguses. Community-acquired pneumonia is often caused by Streptococcus pneumonia bacteria. These bacteria are often passed from one person to another by breathing in droplets from the cough or sneeze of an infected person. RISK FACTORS The condition is more likely to develop in:  People who havechronic diseases, such as chronic obstructive pulmonary disease (COPD), asthma, congestive heart failure, cystic fibrosis, diabetes, or kidney disease.  People who haveearly-stage or late-stage HIV.  People who havesickle cell disease.  People who havehad their spleen removed (splenectomy).  People who havepoor Administrator.  People who havemedical conditions that increase the risk of breathing in (aspirating) secretions their own mouth and nose.   People who havea weakened immune system (immunocompromised).  People who smoke.  People whotravel to areas where pneumonia-causing germs commonly exist.  People whoare around animal habitats or animals that have pneumonia-causing germs, including birds, bats, rabbits, cats, and farm animals. SYMPTOMS Symptoms of this condition include:  Adry cough.  A wet (productive) cough.  Fever.  Sweating.  Chest pain, especially when breathing deeply or coughing.  Rapid breathing or difficulty breathing.  Shortness of  breath.  Shaking chills.  Fatigue.  Muscle aches. DIAGNOSIS Your health care provider will take a medical history and perform a physical exam. You may also have other tests, including:  Imaging studies of your chest, including X-rays.  Tests to check your blood oxygen level and other blood gases.  Other tests on blood, mucus (sputum), fluid around your lungs (pleural fluid), and urine. If your pneumonia is severe, other tests may be done to identify the specific cause of your illness. TREATMENT The type of treatment that you receive depends on many factors, such as the cause of your pneumonia, the medicines you take, and other medical conditions that you have. For most adults, treatment and recovery from pneumonia may occur at home. In some cases, treatment must happen in a hospital. Treatment may include:  Antibiotic medicines, if the pneumonia was caused by bacteria.  Antiviral medicines, if the pneumonia was caused by a virus.  Medicines that are given by mouth or through an IV tube.  Oxygen.  Respiratory therapy. Although rare, treating severe pneumonia may include:  Mechanical ventilation. This is done if you are not breathing well on your own and you cannot maintain a safe blood oxygen level.  Thoracentesis. This procedureremoves fluid around one lung or both lungs to help you breathe better. HOME CARE INSTRUCTIONS  Take over-the-counter and prescription medicines only as told by your health care provider.  Only takecough medicine if you are losing sleep. Understand that cough medicine can prevent your body's natural ability to remove mucus from your lungs.  If you were prescribed an antibiotic medicine, take it as told by your health care provider. Do not stop taking the antibiotic even if you start to feel better.  Sleep in a semi-upright position at night. Try  sleeping in a reclining chair, or place a few pillows under your head.  Do not use tobacco products,  including cigarettes, chewing tobacco, and e-cigarettes. If you need help quitting, ask your health care provider.  Drink enough water to keep your urine clear or pale yellow. This will help to thin out mucus secretions in your lungs. PREVENTION There are ways that you can decrease your risk of developing community-acquired pneumonia. Consider getting a pneumococcal vaccine if:  You are older than 62 years of age.  You are older than 62 years of age and are undergoing cancer treatment, have chronic lung disease, or have other medical conditions that affect your immune system. Ask your health care provider if this applies to you. There are different types and schedules of pneumococcal vaccines. Ask your health care provider which vaccination option is best for you. You may also prevent community-acquired pneumonia if you take these actions:  Get an influenza vaccine every year. Ask your health care provider which type of influenza vaccine is best for you.  Go to the dentist on a regular basis.  Wash your hands often. Use hand sanitizer if soap and water are not available. SEEK MEDICAL CARE IF:  You have a fever.  You are losing sleep because you cannot control your cough with cough medicine. SEEK IMMEDIATE MEDICAL CARE IF:  You have worsening shortness of breath.  You have increased chest pain.  Your sickness becomes worse, especially if you are an older adult or have a weakened immune system.  You cough up blood.   This information is not intended to replace advice given to you by your health care provider. Make sure you discuss any questions you have with your health care provider.   Document Released: 12/22/2004 Document Revised: 09/12/2014 Document Reviewed: 04/18/2014 Elsevier Interactive Patient Education [employer].

## 2016-01-07 ENCOUNTER — Encounter (INDEPENDENT_AMBULATORY_CARE_PROVIDER_SITE_OTHER): Payer: Self-pay

## 2016-01-07 ENCOUNTER — Encounter (INDEPENDENT_AMBULATORY_CARE_PROVIDER_SITE_OTHER): Payer: Self-pay | Admitting: Internal Medicine

## 2016-01-29 ENCOUNTER — Encounter (INDEPENDENT_AMBULATORY_CARE_PROVIDER_SITE_OTHER): Payer: Self-pay | Admitting: Internal Medicine

## 2016-01-29 ENCOUNTER — Ambulatory Visit (INDEPENDENT_AMBULATORY_CARE_PROVIDER_SITE_OTHER): Payer: Medicaid Other | Admitting: Internal Medicine

## 2016-04-30 ENCOUNTER — Encounter: Payer: Self-pay | Admitting: Internal Medicine

## 2016-04-30 ENCOUNTER — Encounter (INDEPENDENT_AMBULATORY_CARE_PROVIDER_SITE_OTHER): Payer: Self-pay | Admitting: Internal Medicine

## 2016-04-30 ENCOUNTER — Encounter (INDEPENDENT_AMBULATORY_CARE_PROVIDER_SITE_OTHER): Payer: Self-pay

## 2016-05-18 ENCOUNTER — Encounter (INDEPENDENT_AMBULATORY_CARE_PROVIDER_SITE_OTHER): Payer: Self-pay | Admitting: Internal Medicine

## 2016-05-18 ENCOUNTER — Ambulatory Visit (INDEPENDENT_AMBULATORY_CARE_PROVIDER_SITE_OTHER): Payer: Medicaid Other | Admitting: Internal Medicine

## 2016-05-25 ENCOUNTER — Other Ambulatory Visit: Payer: Self-pay

## 2016-05-25 ENCOUNTER — Ambulatory Visit (INDEPENDENT_AMBULATORY_CARE_PROVIDER_SITE_OTHER): Payer: Medicaid Other | Admitting: Gastroenterology

## 2016-05-25 ENCOUNTER — Encounter: Payer: Self-pay | Admitting: Gastroenterology

## 2016-05-25 VITALS — BP 128/73 | HR 65 | Temp 97.8°F | Ht 71.5 in | Wt 223.2 lb

## 2016-05-25 DIAGNOSIS — D509 Iron deficiency anemia, unspecified: Secondary | ICD-10-CM

## 2016-05-25 DIAGNOSIS — R197 Diarrhea, unspecified: Secondary | ICD-10-CM | POA: Insufficient documentation

## 2016-05-25 DIAGNOSIS — K219 Gastro-esophageal reflux disease without esophagitis: Secondary | ICD-10-CM | POA: Diagnosis not present

## 2016-05-25 DIAGNOSIS — R109 Unspecified abdominal pain: Secondary | ICD-10-CM | POA: Diagnosis not present

## 2016-05-25 MED ORDER — PEG 3350-KCL-NA BICARB-NACL 420 G PO SOLR: 4000.0000 mL | ORAL | 0 refills | Status: DC

## 2016-05-25 NOTE — Assessment & Plan Note (Addendum)
[age] year old gentleman with recent profound iron deficiency anemia. Has received 2 iron infusions thus far. Patient denies overt GI bleeding. He recalls have not been done. Hematology presumes that he has chronic occult GI blood loss based on their workup. Patient had a colonoscopy over 10 years ago. He reports he had colon polyps. Discussed with patient and wife at length. Would offer colonoscopy with  upper endoscopy for iron deficiency anemia, left-sided abdominal pain. Patient complains a several month history of loose stools. Possibly side effect of metformin or other medication, consider celiac disease given iron deficiency anemia. Planning on labs CBC and ferritin at time of preop. Consider small biopsy to exclude celiac disease.  I have discussed the risks, alternatives, benefits with regards to but not limited to the risk of reaction to medication, bleeding, infection, perforation and the patient is agreeable to proceed. Written consent to be obtained.  Deep sedation in the or as patient is requesting to make sure he is completely sedated.

## 2016-05-25 NOTE — Progress Notes (Signed)
Primary Care Physician:  Pat PatrickAkinleye, Akintunde Rasaq, MD  Primary Gastroenterologist:  Roetta SessionsMichael Rourk, MD   Chief Complaint  Patient presents with  . Anemia    referred for tcs/egd. Had tcs approx 10 yrs ago (Dr.Patel)  . Emesis  . Abdominal Pain  . Diarrhea    HPI:  Dustin Roberts is a 61 y.o. male here At the request of Dr.Akinleye for further evaluation of iron deficiency anemia. Recently established care with hematology for chronic anemia. [place] Patient states it was a routine exam and he reports that couple polyps. We do not have those records.. Recent labs indicated significant deficiency anemia with ferritin of 5. Hemoglobin 8.1, hematocrit 30, MCV 70.9. B12 243, folate 12.3. Patient was offered blood transfusions but he declined. He has received 2 IV iron infusions however. Clinically he states he feels less fatigued. His hemoglobin was 9.9 on April 17.  Patient states he was told a long time. Ulcers. He denies prior upper endoscopy. He denies melena or rectal bleeding. Typically has anywhere from 0 bowel movements to 2-3 runny stools daily. Denies nocturnal stools. States he's been on metformin for several years. Denies recent medication changes. Stools have been running for several months. States he has a lot of heartburn Protonix. He has had several month history of left-sided abdominal discomfort, and not made better or worse with any particular thing. Doesn't take any medication for it. Describes as "it just hurts". No weight loss. He states he was told not to take iron now that he has received iron infusions.  Current Outpatient Prescriptions  Medication Sig Dispense Refill  . acetaminophen (TYLENOL) 500 MG tablet Take 500 mg by mouth every 6 (six) hours as needed for mild pain or moderate pain.    Marland Kitchen. insulin aspart (NOVOLOG FLEXPEN) 100 UNIT/ML FlexPen Inject 10 Units into the skin 3 (three) times daily before meals.    . Insulin Glargine  (LANTUS SOLOSTAR) 100 UNIT/ML Solostar Pen Inject 50 Units into the skin every morning.    Marland Kitchen. lisinopril (PRINIVIL,ZESTRIL) 40 MG tablet Take 40 mg by mouth every evening.   3  . loratadine (ALLERGY RELIEF) 10 MG tablet Take 10 mg by mouth daily.    . metFORMIN (GLUCOPHAGE) 1000 MG tablet Take 1,000 mg by mouth 2 (two) times daily.  3  . pantoprazole (PROTONIX) 40 MG tablet Take 40 mg by mouth every evening.   3  . simvastatin (ZOCOR) 40 MG tablet Take 40 mg by mouth every evening.   3  . albuterol (PROVENTIL) (2.5 MG/3ML) 0.083% nebulizer solution Take 3 mLs (2.5 mg total) by nebulization every 6 (six) hours as needed for wheezing. (Patient not taking: Reported on 02/11/2015) 75 mL 1  . ipratropium (ATROVENT) 0.02 % nebulizer solution Take 2.5 mLs (500 mcg total) by nebulization 4 (four) times daily. 75 mL 1   No current facility-administered medications for this visit.     Allergies as of 05/25/2016  . (No Known Allergies)    Past Medical History:  Diagnosis Date  . Arthritis   . CHF (congestive heart failure) (HCC)   . Diabetes mellitus   . Hypertension   . IDA (iron deficiency anemia)   . Stroke [facility](HCC)    Due to brain aneurysm    Past Surgical History:  Procedure Laterality Date  . APPENDECTOMY    . BRAIN SURGERY     aneurysm, age 61  . CEREBRAL ANEURYSM REPAIR     Second  procedure was gamma knife repair  . FRACTURE SURGERY     left wrist  . HERNIA REPAIR     left inguinal and umbilical  . KNEE SURGERY Left    twice  . ROTATOR CUFF REPAIR Left     Family History  Problem Relation Age of Onset  . Colon cancer Neg Hx     Social History   Social History  . Marital status: Married    Spouse name: N/A  . Number of children: N/A  . Years of education: N/A   Occupational History  . Not on file.   Social History Main Topics  . Smoking status: Never Smoker  . Smokeless tobacco: Never Used  . Alcohol use No  . Drug use: No  . Sexual activity: Not on file    Other Topics Concern  . Not on file   Social History Narrative  . No narrative on file      ROS: General: Negative for anorexia, weight loss, fever, chills, fatigue, weakness. Eyes: Negative for vision changes.  ENT: Negative for hoarseness, difficulty swallowing , nasal congestion. CV: Negative for chest pain, angina, palpitations, dyspnea on exertion, peripheral edema.  Respiratory: Negative for dyspnea at rest, dyspnea on exertion, cough, sputum, wheezing.  GI: See history of present illness. GU:  Negative for dysuria, hematuria, urinary incontinence, urinary frequency, nocturnal urination.  MS: Negative for joint pain, low back pain.  Derm: Negative for rash or itching.  Neuro: Negative for weakness, abnormal sensation, seizure, frequent headaches, memory loss, confusion. Occasional dizziness Psych: Negative for anxiety, depression, suicidal ideation, hallucinations.  Endo: Negative for unusual weight change.  Heme: Negative for bruising or bleeding. Allergy: Negative for rash or hives.    Physical Examination:  BP 128/73   Pulse 65   Temp 97.8 F (36.6 C) (Oral)   Ht 5' 11.5" (1.816 m)   Wt 223 lb 3.2 oz (101.2 kg)   BMI 30.70 kg/m    General: Well-nourished, well-developed in no acute distress. Accompanied by wife and 2 granddaughters. Head: Normocephalic, atraumatic.   Eyes: Conjunctiva pink, no icterus. Mouth: Oropharyngeal mucosa moist and pink , no lesions erythema or exudate. Neck: Supple without thyromegaly, masses, or lymphadenopathy.  Lungs: Clear to auscultation bilaterally.  Heart: Regular rate and rhythm, no murmurs rubs or gallops.  Abdomen: Bowel sounds are normal, nontender, nondistended, no hepatosplenomegaly or masses, no abdominal bruits or    hernia , no rebound or guarding.   Rectal: Not performed Extremities: No lower extremity edema. No clubbing or deformities.  Neuro: Alert and oriented x 4 , grossly normal neurologically.  Skin: Warm and  dry, no rash or jaundice.   Psych: Alert and cooperative, normal mood and affect.  Labs: Labs from April 2018, ferritin 6.4, white blood cell count 5540, hemoglobin 9.9, hematocrit 74, platelets 204,000. According to records from [facility] medical oncology and hematology, patient also had previous April labs with folate 12.3, vitamin B12 243, total bilirubin 0.2, alkaline phosphatase 108, AST 24, ALT 37, albumin 3.9, creatinine 1.0, iron saturations 52%  Imaging Studies: No results found.

## 2016-05-25 NOTE — Progress Notes (Signed)
CC'D TO PCP °

## 2016-05-25 NOTE — Patient Instructions (Signed)
1. Colonoscopy and endoscopy as scheduled. Please see separate instructions.

## 2016-05-26 ENCOUNTER — Other Ambulatory Visit: Payer: Self-pay

## 2016-05-26 ENCOUNTER — Encounter (HOSPITAL_COMMUNITY)
Admission: RE | Admit: 2016-05-26 | Discharge: 2016-05-26 | Disposition: A | Discharge status: Home / Self Care | Payer: Medicaid Other | Source: Ambulatory Visit | Attending: Internal Medicine | Admitting: Internal Medicine

## 2016-05-26 ENCOUNTER — Encounter (HOSPITAL_COMMUNITY): Payer: Self-pay

## 2016-05-26 DIAGNOSIS — K5790 Diverticulosis of intestine, part unspecified, without perforation or abscess without bleeding: Secondary | ICD-10-CM | POA: Insufficient documentation

## 2016-05-26 DIAGNOSIS — R000 Tachycardia, unspecified: Secondary | ICD-10-CM | POA: Insufficient documentation

## 2016-05-26 DIAGNOSIS — K229 Disease of esophagus, unspecified: Secondary | ICD-10-CM | POA: Insufficient documentation

## 2016-05-26 DIAGNOSIS — K621 Rectal polyp: Secondary | ICD-10-CM | POA: Diagnosis not present

## 2016-05-26 DIAGNOSIS — Z01818 Encounter for other preprocedural examination: Secondary | ICD-10-CM | POA: Diagnosis present

## 2016-05-26 DIAGNOSIS — Z01810 Encounter for preprocedural cardiovascular examination: Secondary | ICD-10-CM | POA: Diagnosis not present

## 2016-05-26 DIAGNOSIS — K449 Diaphragmatic hernia without obstruction or gangrene: Secondary | ICD-10-CM | POA: Insufficient documentation

## 2016-05-26 DIAGNOSIS — K649 Unspecified hemorrhoids: Secondary | ICD-10-CM | POA: Diagnosis not present

## 2016-05-26 HISTORY — DX: Unspecified convulsions: R56.9

## 2016-05-26 HISTORY — DX: Sleep apnea, unspecified: G47.30

## 2016-05-26 HISTORY — DX: Gastro-esophageal reflux disease without esophagitis: K21.9

## 2016-05-26 HISTORY — DX: Unspecified hearing loss, unspecified ear: H91.90

## 2016-05-26 HISTORY — DX: Headache: R51

## 2016-05-26 HISTORY — DX: Personal history of urinary calculi: Z87.442

## 2016-05-26 HISTORY — DX: Headache, unspecified: R51.9

## 2016-05-26 HISTORY — DX: Acute myocardial infarction, unspecified: I21.9

## 2016-05-26 LAB — BASIC METABOLIC PANEL
Anion gap: 10 (ref 5–15)
BUN: 9 mg/dL (ref 6–20)
CHLORIDE: 100 mmol/L — AB (ref 101–111)
CO2: 24 mmol/L (ref 22–32)
CREATININE: 1 mg/dL (ref 0.61–1.24)
Calcium: 9.5 mg/dL (ref 8.9–10.3)
GFR calc non Af Amer: >60 mL/min (ref >60)
Glucose, Bld: 239 mg/dL — ABNORMAL HIGH (ref 65–99)
POTASSIUM: 3.7 mmol/L (ref 3.5–5.1)
Sodium: 134 mmol/L — ABNORMAL LOW (ref 135–145)

## 2016-05-26 LAB — CBC WITH DIFFERENTIAL/PLATELET
Basophils Absolute: 0 10*3/uL (ref 0.0–0.1)
Basophils Relative: 1 %
Eosinophils Absolute: 0 10*3/uL (ref 0.0–0.7)
Eosinophils Relative: 1 %
HEMATOCRIT: 40.7 % (ref 39.0–52.0)
Hemoglobin: 13 g/dL (ref 13.0–17.0)
LYMPHS ABS: 0.6 10*3/uL — AB (ref 0.7–4.0)
LYMPHS PCT: 12 %
MCH: 24.7 pg — ABNORMAL LOW (ref 26.0–34.0)
MCHC: 31.9 g/dL (ref 30.0–36.0)
MCV: 77.2 fL — ABNORMAL LOW (ref 78.0–100.0)
MONO ABS: 0.5 10*3/uL (ref 0.1–1.0)
MONOS PCT: 11 %
NEUTROS ABS: 3.7 10*3/uL (ref 1.7–7.7)
Neutrophils Relative %: 76 %
Platelets: 132 10*3/uL — ABNORMAL LOW (ref 150–400)
RBC: 5.27 MIL/uL (ref 4.22–5.81)
RDW: 21.2 % — AB (ref 11.5–15.5)
WBC: 4.9 10*3/uL (ref 4.0–10.5)

## 2016-05-26 LAB — FERRITIN: FERRITIN: 25 ng/mL (ref 24–336)

## 2016-05-26 NOTE — Patient Instructions (Signed)
Dustin CoriaJohn H Roberts  05/26/2016     @[email or web address] @   Your procedure is scheduled on  05/27/2016   Report [facility] at  1100  A.M.  Call this number if you have problems the morning of surgery:  [phone]   Remember:  Do not eat food or drink liquids after midnight.  Take these medicines the morning of surgery with A SIP OF WATER  Lisinopril, loratadine, protonix. Take 1/2 of your ususal insulin dosage the night before your procedure. DO NOT take any medication for diabetes the morning of your procedure.   Do not wear jewelry, make-up or nail polish.  Do not wear lotions, powders, or perfumes, or deoderant.  Do not shave 48 hours prior to surgery.  Men may shave face and neck.  Do not bring valuables to the hospital.  [facility] belongings or valuables.  Contacts, dentures or bridgework may not be worn into surgery.  Leave your suitcase in the car.  After surgery it may be brought to your room.  For patients admitted to the hospital, discharge time will be determined by your treatment team.  Patients discharged the day of surgery will not be allowed to drive home.   Name and phone number of your driver:   family Special instructions:  Follow the diet and prep instructions given to you by Dr Luvenia Starchourk's office.  Please read over the following fact sheets that you were given. Anesthesia Post-op Instructions and Care and Recovery After Surgery       Esophagogastroduodenoscopy Esophagogastroduodenoscopy (EGD) is a procedure to examine the lining of the esophagus, stomach, and first part of the small intestine (duodenum). This procedure is done to check for problems such as inflammation, bleeding, ulcers, or growths. During this procedure, a long, flexible, lighted tube with a camera attached (endoscope) is inserted down the throat. Tell a health care provider about:  Any allergies you have.  All medicines you are taking,  including vitamins, herbs, eye drops, creams, and over-the-counter medicines.  Any problems you or family members have had with anesthetic medicines.  Any blood disorders you have.  Any surgeries you have had.  Any medical conditions you have.  Whether you are pregnant or may be pregnant. What are the risks? Generally, this is a safe procedure. However, problems may occur, including:  Infection.  Bleeding.  A tear (perforation) in the esophagus, stomach, or duodenum.  Trouble breathing.  Excessive sweating.  Spasms of the larynx.  A slowed heartbeat.  Low blood pressure. What happens before the procedure?  Follow instructions from your health care provider about eating or drinking restrictions.  Ask your health care provider about:  Changing or stopping your regular medicines. This is especially important if you are taking diabetes medicines or blood thinners.  Taking medicines such as aspirin and ibuprofen. These medicines can thin your blood. Do not take these medicines before your procedure if your health care provider instructs you not to.  Plan to have someone take you home after the procedure.  If you wear dentures, be ready to remove them before the procedure. What happens during the procedure?  To reduce your risk of infection, your health care team will wash or sanitize their hands.  An IV tube will be put in a vein in your hand or arm. You will get medicines and fluids through this tube.  You will be given  one or more of the following:  A medicine to help you relax (sedative).  A medicine to numb the area (local anesthetic). This medicine may be sprayed into your throat. It will make you feel more comfortable and keep you from gagging or coughing during the procedure.  A medicine for pain.  A mouth guard may be placed in your mouth to protect your teeth and to keep you from biting on the endoscope.  You will be asked to lie on your left side.  The  endoscope will be lowered down your throat into your esophagus, stomach, and duodenum.  Air will be put into the endoscope. This will help your health care provider see better.  The lining of your esophagus, stomach, and duodenum will be examined.  Your health care provider may:  Take a tissue sample so it can be looked at in a lab (biopsy).  Remove growths.  Remove objects (foreign bodies) that are stuck.  Treat any bleeding with medicines or other devices that stop tissue from bleeding.  Widen (dilate) or stretch narrowed areas of your esophagus and stomach.  The endoscope will be taken out. The procedure may vary among health care providers and hospitals. What happens after the procedure?  Your blood pressure, heart rate, breathing rate, and blood oxygen level will be monitored often until the medicines you were given have worn off.  Do not eat or drink anything until the numbing medicine has worn off and your gag reflex has returned. This information is not intended to replace advice given to you by your health care provider. Make sure you discuss any questions you have with your health care provider. Document Released: 04/24/2004 Document Revised: 05/30/2015 Document Reviewed: 11/15/2014 Elsevier Interactive Patient Education  2017 [employer] Esophagogastroduodenoscopy, Care After Refer to this sheet in the next few weeks. These instructions provide you with information about caring for yourself after your procedure. Your health care provider may also give you more specific instructions. Your treatment has been planned according to current medical practices, but problems sometimes occur. Call your health care provider if you have any problems or questions after your procedure. What can I expect after the procedure? After the procedure, it is common to have:  A sore throat.  Nausea.  Bloating.  Dizziness.  Fatigue. Follow these instructions at home:  Do not eat or  drink anything until the numbing medicine (local anesthetic) has worn off and your gag reflex has returned. You will know that the local anesthetic has worn off when you can swallow comfortably.  Do not drive for 24 hours if you received a medicine to help you relax (sedative).  If your health care provider took a tissue sample for testing during the procedure, make sure to get your test results. This is your responsibility. Ask your health care provider or the department performing the test when your results will be ready.  Keep all follow-up visits as told by your health care provider. This is important. Contact a health care provider if:  You cannot stop coughing.  You are not urinating.  You are urinating less than usual. Get help right away if:  You have trouble swallowing.  You cannot eat or drink.  You have throat or chest pain that gets worse.  You are dizzy or light-headed.  You faint.  You have nausea or vomiting.  You have chills.  You have a fever.  You have severe abdominal pain.  You have black, tarry, or bloody stools.  This information is not intended to replace advice given to you by your health care provider. Make sure you discuss any questions you have with your health care provider. Document Released: 12/09/2011 Document Revised: 05/30/2015 Document Reviewed: 11/15/2014 Elsevier Interactive Patient Education  2017 [employer]  Colonoscopy, Adult A colonoscopy is an exam to look at the entire large intestine. During the exam, a lubricated, bendable tube is inserted into the anus and then passed into the rectum, colon, and other parts of the large intestine. A colonoscopy is often done as a part of normal colorectal screening or in response to certain symptoms, such as anemia, persistent diarrhea, abdominal pain, and blood in the stool. The exam can help screen for and diagnose medical problems, including:  Tumors.  Polyps.  Inflammation.  Areas of  bleeding. Tell a health care provider about:  Any allergies you have.  All medicines you are taking, including vitamins, herbs, eye drops, creams, and over-the-counter medicines.  Any problems you or family members have had with anesthetic medicines.  Any blood disorders you have.  Any surgeries you have had.  Any medical conditions you have.  Any problems you have had passing stool. What are the risks? Generally, this is a safe procedure. However, problems may occur, including:  Bleeding.  A tear in the intestine.  A reaction to medicines given during the exam.  Infection (rare). What happens before the procedure? Eating and drinking restrictions  Follow instructions from your health care provider about eating and drinking, which may include:  A few days before the procedure - follow a low-fiber diet. Avoid nuts, seeds, dried fruit, raw fruits, and vegetables.  1-3 days before the procedure - follow a clear liquid diet. Drink only clear liquids, such as clear broth or bouillon, black coffee or tea, clear juice, clear soft drinks or sports drinks, gelatin dessert, and popsicles. Avoid any liquids that contain red or purple dye.  On the day of the procedure - do not eat or drink anything during the 2 hours before the procedure, or within the time period that your health care provider recommends. Bowel prep  If you were prescribed an oral bowel prep to clean out your colon:  Take it as told by your health care provider. Starting the day before your procedure, you will need to drink a large amount of medicated liquid. The liquid will cause you to have multiple loose stools until your stool is almost clear or light green.  If your skin or anus gets irritated from diarrhea, you may use these to relieve the irritation:  Medicated wipes, such as adult wet wipes with aloe and vitamin E.  A skin soothing-product like petroleum jelly.  If you vomit while drinking the bowel prep,  take a break for up to 60 minutes and then begin the bowel prep again. If vomiting continues and you cannot take the bowel prep without vomiting, call your health care provider. General instructions   Ask your health care provider about changing or stopping your regular medicines. This is especially important if you are taking diabetes medicines or blood thinners.  Plan to have someone take you home from the hospital or clinic. What happens during the procedure?  An IV tube may be inserted into one of your veins.  You will be given medicine to help you relax (sedative).  To reduce your risk of infection:  Your health care team will wash or sanitize their hands.  Your anal area will be washed with  soap.  You will be asked to lie on your side with your knees bent.  Your health care provider will lubricate a long, thin, flexible tube. The tube will have a camera and a light on the end.  The tube will be inserted into your anus.  The tube will be gently eased through your rectum and colon.  Air will be delivered into your colon to keep it open. You may feel some pressure or cramping.  The camera will be used to take images during the procedure.  A small tissue sample may be removed from your body to be examined under a microscope (biopsy). If any potential problems are found, the tissue will be sent to a lab for testing.  If small polyps are found, your health care provider may remove them and have them checked for cancer cells.  The tube that was inserted into your anus will be slowly removed. The procedure may vary among health care providers and hospitals. What happens after the procedure?  Your blood pressure, heart rate, breathing rate, and blood oxygen level will be monitored until the medicines you were given have worn off.  Do not drive for 24 hours after the exam.  You may have a small amount of blood in your stool.  You may pass gas and have mild abdominal cramping  or bloating due to the air that was used to inflate your colon during the exam.  It is up to you to get the results of your procedure. Ask your health care provider, or the department performing the procedure, when your results will be ready. This information is not intended to replace advice given to you by your health care provider. Make sure you discuss any questions you have with your health care provider. Document Released: 12/20/1999 Document Revised: 10/23/2015 Document Reviewed: 03/05/2015 Elsevier Interactive Patient Education  2017 [employer]  Colonoscopy, Adult, Care After This sheet gives you information about how to care for yourself after your procedure. Your health care provider may also give you more specific instructions. If you have problems or questions, contact your health care provider. What can I expect after the procedure? After the procedure, it is common to have:  A small amount of blood in your stool for 24 hours after the procedure.  Some gas.  Mild abdominal cramping or bloating. Follow these instructions at home: General instructions    For the first 24 hours after the procedure:  Do not drive or use machinery.  Do not sign important documents.  Do not drink alcohol.  Do your regular daily activities at a slower pace than normal.  Eat soft, easy-to-digest foods.  Rest often.  Take over-the-counter or prescription medicines only as told by your health care provider.  It is up to you to get the results of your procedure. Ask your health care provider, or the department performing the procedure, when your results will be ready. Relieving cramping and bloating   Try walking around when you have cramps or feel bloated.  Apply heat to your abdomen as told by your health care provider. Use a heat source that your health care provider recommends, such as a moist heat pack or a heating pad.  Place a towel between your skin and the heat  source.  Leave the heat on for 20-30 minutes.  Remove the heat if your skin turns bright red. This is especially important if you are unable to feel pain, heat, or cold. You may have a greater  risk of getting burned. Eating and drinking   Drink enough fluid to keep your urine clear or pale yellow.  Resume your normal diet as instructed by your health care provider. Avoid heavy or fried foods that are hard to digest.  Avoid drinking alcohol for as long as instructed by your health care provider. Contact a health care provider if:  You have blood in your stool 2-3 days after the procedure. Get help right away if:  You have more than a small spotting of blood in your stool.  You pass large blood clots in your stool.  Your abdomen is swollen.  You have nausea or vomiting.  You have a fever.  You have increasing abdominal pain that is not relieved with medicine. This information is not intended to replace advice given to you by your health care provider. Make sure you discuss any questions you have with your health care provider. Document Released: 08/06/2003 Document Revised: 09/16/2015 Document Reviewed: 03/05/2015 Elsevier Interactive Patient Education  2017 [employer]  Monitored Anesthesia Care Anesthesia is a term that refers to techniques, procedures, and medicines that help a person stay safe and comfortable during a medical procedure. Monitored anesthesia care, or sedation, is one type of anesthesia. Your anesthesia specialist may recommend sedation if you will be having a procedure that does not require you to be unconscious, such as:  Cataract surgery.  A dental procedure.  A biopsy.  A colonoscopy. During the procedure, you may receive a medicine to help you relax (sedative). There are three levels of sedation:  Mild sedation. At this level, you may feel awake and relaxed. You will be able to follow directions.  Moderate sedation. At this level, you will be  sleepy. You may not remember the procedure.  Deep sedation. At this level, you will be asleep. You will not remember the procedure. The more medicine you are given, the deeper your level of sedation will be. Depending on how you respond to the procedure, the anesthesia specialist may change your level of sedation or the type of anesthesia to fit your needs. An anesthesia specialist will monitor you closely during the procedure. Let your health care provider know about:  Any allergies you have.  All medicines you are taking, including vitamins, herbs, eye drops, creams, and over-the-counter medicines.  Any use of steroids (by mouth or as a cream).  Any problems you or family members have had with sedatives and anesthetic medicines.  Any blood disorders you have.  Any surgeries you have had.  Any medical conditions you have, such as sleep apnea.  Whether you are pregnant or may be pregnant.  Any use of cigarettes, alcohol, or street drugs. What are the risks? Generally, this is a safe procedure. However, problems may occur, including:  Getting too much medicine (oversedation).  Nausea.  Allergic reaction to medicines.  Trouble breathing. If this happens, a breathing tube may be used to help with breathing. It will be removed when you are awake and breathing on your own.  Heart trouble.  Lung trouble. Before the procedure Staying hydrated  Follow instructions from your health care provider about hydration, which may include:  Up to 2 hours before the procedure - you may continue to drink clear liquids, such as water, clear fruit juice, black coffee, and plain tea. Eating and drinking restrictions  Follow instructions from your health care provider about eating and drinking, which may include:  8 hours before the procedure - stop eating heavy meals or  foods such as meat, fried foods, or fatty foods.  6 hours before the procedure - stop eating light meals or foods, such as  toast or cereal.  6 hours before the procedure - stop drinking milk or drinks that contain milk.  2 hours before the procedure - stop drinking clear liquids. Medicines  Ask your health care provider about:  Changing or stopping your regular medicines. This is especially important if you are taking diabetes medicines or blood thinners.  Taking medicines such as aspirin and ibuprofen. These medicines can thin your blood. Do not take these medicines before your procedure if your health care provider instructs you not to. Tests and exams  You will have a physical exam.  You may have blood tests done to show:  How well your kidneys and liver are working.  How well your blood can clot.  General instructions  Plan to have someone take you home from the hospital or clinic.  If you will be going home right after the procedure, plan to have someone with you for 24 hours. What happens during the procedure?  Your blood pressure, heart rate, breathing, level of pain and overall condition will be monitored.  An IV tube will be inserted into one of your veins.  Your anesthesia specialist will give you medicines as needed to keep you comfortable during the procedure. This may mean changing the level of sedation.  The procedure will be performed. After the procedure  Your blood pressure, heart rate, breathing rate, and blood oxygen level will be monitored until the medicines you were given have worn off.  Do not drive for 24 hours if you received a sedative.  You may:  Feel sleepy, clumsy, or nauseous.  Feel forgetful about what happened after the procedure.  Have a sore throat if you had a breathing tube during the procedure.  Vomit. This information is not intended to replace advice given to you by your health care provider. Make sure you discuss any questions you have with your health care provider. Document Released: 09/17/2004 Document Revised: 05/31/2015 Document Reviewed:  04/14/2015 Elsevier Interactive Patient Education  2017 [employer] Monitored Anesthesia Care, Care After These instructions provide you with information about caring for yourself after your procedure. Your health care provider may also give you more specific instructions. Your treatment has been planned according to current medical practices, but problems sometimes occur. Call your health care provider if you have any problems or questions after your procedure. What can I expect after the procedure? After your procedure, it is common to:  Feel sleepy for several hours.  Feel clumsy and have poor balance for several hours.  Feel forgetful about what happened after the procedure.  Have poor judgment for several hours.  Feel nauseous or vomit.  Have a sore throat if you had a breathing tube during the procedure. Follow these instructions at home: For at least 24 hours after the procedure:    Do not:  Participate in activities in which you could fall or become injured.  Drive.  Use heavy machinery.  Drink alcohol.  Take sleeping pills or medicines that cause drowsiness.  Make important decisions or sign legal documents.  Take care of children on your own.  Rest. Eating and drinking   Follow the diet that is recommended by your health care provider.  If you vomit, drink water, juice, or soup when you can drink without vomiting.  Make sure you have little or no nausea before eating solid  foods. General instructions   Have a responsible adult stay with you until you are awake and alert.  Take over-the-counter and prescription medicines only as told by your health care provider.  If you smoke, do not smoke without supervision.  Keep all follow-up visits as told by your health care provider. This is important. Contact a health care provider if:  You keep feeling nauseous or you keep vomiting.  You feel light-headed.  You develop a rash.  You have a  fever. Get help right away if:  You have trouble breathing. This information is not intended to replace advice given to you by your health care provider. Make sure you discuss any questions you have with your health care provider. Document Released: 04/14/2015 Document Revised: 08/14/2015 Document Reviewed: 04/14/2015 Elsevier Interactive Patient Education  2017 [employer].

## 2016-05-27 ENCOUNTER — Ambulatory Visit (HOSPITAL_COMMUNITY)
Admission: RE | Admit: 2016-05-27 | Discharge: 2016-05-27 | Disposition: A | Discharge status: Home / Self Care | Payer: Medicaid Other | Source: Ambulatory Visit | Attending: Internal Medicine | Admitting: Internal Medicine

## 2016-05-27 ENCOUNTER — Telehealth: Payer: Self-pay | Admitting: Internal Medicine

## 2016-05-27 ENCOUNTER — Ambulatory Visit (HOSPITAL_COMMUNITY): Payer: Medicaid Other | Admitting: Anesthesiology

## 2016-05-27 ENCOUNTER — Encounter (HOSPITAL_COMMUNITY): Payer: Self-pay | Admitting: *Deleted

## 2016-05-27 ENCOUNTER — Encounter (HOSPITAL_COMMUNITY)
Admission: RE | Disposition: A | Discharge status: Home / Self Care | Payer: Self-pay | Source: Ambulatory Visit | Attending: Internal Medicine

## 2016-05-27 DIAGNOSIS — K640 First degree hemorrhoids: Secondary | ICD-10-CM | POA: Insufficient documentation

## 2016-05-27 DIAGNOSIS — Z794 Long term (current) use of insulin: Secondary | ICD-10-CM | POA: Insufficient documentation

## 2016-05-27 DIAGNOSIS — K635 Polyp of colon: Secondary | ICD-10-CM | POA: Insufficient documentation

## 2016-05-27 DIAGNOSIS — I110 Hypertensive heart disease with heart failure: Secondary | ICD-10-CM | POA: Diagnosis not present

## 2016-05-27 DIAGNOSIS — K228 Other specified diseases of esophagus: Secondary | ICD-10-CM | POA: Insufficient documentation

## 2016-05-27 DIAGNOSIS — G4730 Sleep apnea, unspecified: Secondary | ICD-10-CM | POA: Diagnosis not present

## 2016-05-27 DIAGNOSIS — K219 Gastro-esophageal reflux disease without esophagitis: Secondary | ICD-10-CM | POA: Insufficient documentation

## 2016-05-27 DIAGNOSIS — I252 Old myocardial infarction: Secondary | ICD-10-CM | POA: Insufficient documentation

## 2016-05-27 DIAGNOSIS — I2510 Atherosclerotic heart disease of native coronary artery without angina pectoris: Secondary | ICD-10-CM | POA: Insufficient documentation

## 2016-05-27 DIAGNOSIS — Z8673 Personal history of transient ischemic attack (TIA), and cerebral infarction without residual deficits: Secondary | ICD-10-CM | POA: Diagnosis not present

## 2016-05-27 DIAGNOSIS — R109 Unspecified abdominal pain: Secondary | ICD-10-CM

## 2016-05-27 DIAGNOSIS — E119 Type 2 diabetes mellitus without complications: Secondary | ICD-10-CM | POA: Insufficient documentation

## 2016-05-27 DIAGNOSIS — K449 Diaphragmatic hernia without obstruction or gangrene: Secondary | ICD-10-CM | POA: Insufficient documentation

## 2016-05-27 DIAGNOSIS — K5730 Diverticulosis of large intestine without perforation or abscess without bleeding: Secondary | ICD-10-CM | POA: Insufficient documentation

## 2016-05-27 DIAGNOSIS — Z9889 Other specified postprocedural states: Secondary | ICD-10-CM | POA: Insufficient documentation

## 2016-05-27 DIAGNOSIS — Z9049 Acquired absence of other specified parts of digestive tract: Secondary | ICD-10-CM | POA: Diagnosis not present

## 2016-05-27 DIAGNOSIS — D509 Iron deficiency anemia, unspecified: Secondary | ICD-10-CM | POA: Diagnosis not present

## 2016-05-27 DIAGNOSIS — I509 Heart failure, unspecified: Secondary | ICD-10-CM | POA: Insufficient documentation

## 2016-05-27 DIAGNOSIS — Z79899 Other long term (current) drug therapy: Secondary | ICD-10-CM | POA: Insufficient documentation

## 2016-05-27 HISTORY — PX: BIOPSY: SHX5522

## 2016-05-27 HISTORY — PX: COLONOSCOPY WITH PROPOFOL: SHX5780

## 2016-05-27 HISTORY — PX: ESOPHAGOGASTRODUODENOSCOPY (EGD) WITH PROPOFOL: SHX5813

## 2016-05-27 HISTORY — PX: POLYPECTOMY: SHX5525

## 2016-05-27 LAB — GLUCOSE, CAPILLARY
GLUCOSE-CAPILLARY: 143 mg/dL — AB (ref 65–99)
Glucose-Capillary: 159 mg/dL — ABNORMAL HIGH (ref 65–99)

## 2016-05-27 SURGERY — COLONOSCOPY WITH PROPOFOL
Anesthesia: Monitor Anesthesia Care

## 2016-05-27 MED ORDER — LIDOCAINE VISCOUS 2 % MT SOLN
OROMUCOSAL | Status: AC
  Filled 2016-05-27: qty 15

## 2016-05-27 MED ORDER — PROPOFOL 500 MG/50ML IV EMUL
INTRAVENOUS | Status: DC | PRN
  Administered 2016-05-27 (×2): via INTRAVENOUS
  Administered 2016-05-27: 150 ug/kg/min via INTRAVENOUS

## 2016-05-27 MED ORDER — PROPOFOL 10 MG/ML IV BOLUS
INTRAVENOUS | Status: AC
  Filled 2016-05-27: qty 20

## 2016-05-27 MED ORDER — FENTANYL CITRATE (PF) 100 MCG/2ML IJ SOLN
INTRAMUSCULAR | Status: AC
  Filled 2016-05-27: qty 2

## 2016-05-27 MED ORDER — FENTANYL CITRATE (PF) 100 MCG/2ML IJ SOLN
INTRAMUSCULAR | Status: DC | PRN
  Administered 2016-05-27: 100 ug via INTRAVENOUS

## 2016-05-27 MED ORDER — CHLORHEXIDINE GLUCONATE CLOTH 2 % EX PADS: 6.0000 | MEDICATED_PAD | Freq: Once | CUTANEOUS | Status: DC

## 2016-05-27 MED ORDER — MIDAZOLAM HCL 5 MG/5ML IJ SOLN
INTRAMUSCULAR | Status: DC | PRN
  Administered 2016-05-27 (×2): 1 mg via INTRAVENOUS

## 2016-05-27 MED ORDER — FENTANYL CITRATE (PF) 100 MCG/2ML IJ SOLN
25.0000 ug | INTRAMUSCULAR | Status: AC | PRN
  Administered 2016-05-27 (×2): 25 ug via INTRAVENOUS

## 2016-05-27 MED ORDER — LACTATED RINGERS IV SOLN
INTRAVENOUS | Status: DC
  Administered 2016-05-27: 1000 mL via INTRAVENOUS

## 2016-05-27 MED ORDER — MIDAZOLAM HCL 2 MG/2ML IJ SOLN
INTRAMUSCULAR | Status: AC
  Filled 2016-05-27: qty 2

## 2016-05-27 MED ORDER — MIDAZOLAM HCL 2 MG/2ML IJ SOLN
1.0000 mg | INTRAMUSCULAR | Status: DC | PRN
Start: 2016-05-27 — End: 2016-05-27
  Administered 2016-05-27: 2 mg via INTRAVENOUS

## 2016-05-27 MED ORDER — LIDOCAINE VISCOUS 2 % MT SOLN
5.0000 mL | Freq: Two times a day (BID) | OROMUCOSAL | Status: DC
  Administered 2016-05-27 (×2): 5 mL via OROMUCOSAL

## 2016-05-27 NOTE — Op Note (Signed)
[facility] Patient Name: Dustin Roberts Procedure Date: 05/27/2016 12:39 PM MRN: [id number] Date of Birth: 1955/08/15 Attending MD: [name] , MD CSN: [id number] Age: 61 Admit Type: Outpatient Procedure:                Upper GI endoscopy Indications:              Iron deficiency anemia Providers:                [name], MD, Nena Polio, RN, Burke Keels, Technician Referring MD:             Altamease Oiler Medicines:                Propofol per Anesthesia Complications:            No immediate complications. Estimated Blood Loss:     Estimated blood loss was minimal. Procedure:                Pre-Anesthesia Assessment:                           - Prior to the procedure, a History and Physical                            was performed, and patient medications and                            allergies were reviewed. The patient's tolerance of                            previous anesthesia was also reviewed. The risks                            and benefits of the procedure and the sedation                            options and risks were discussed with the patient.                            All questions were answered, and informed consent                            was obtained. Prior Anticoagulants: The patient has                            taken no previous anticoagulant or antiplatelet                            agents. ASA Grade Assessment: II - A patient with                            mild systemic disease. After reviewing the risks  and benefits, the patient was deemed in                            satisfactory condition to undergo the procedure.                           After obtaining informed consent, the endoscope was                            passed under direct vision. Throughout the                            procedure, the patient's blood pressure, pulse, and     oxygen saturations were monitored continuously. The                            [id number] ([id number]) scope was introduced through the                            and advanced to the fourth part of duodenum. The                            upper GI endoscopy was accomplished without                            difficulty. The patient tolerated the procedure                            well. Scope In: 12:47:27 PM Scope Out: 12:54:49 PM Total Procedure Duration: 0 hours 7 minutes 22 seconds  Findings:      3 "tongues" of salmon-colored epithelium coming up 3 cm from the GE       junction. No nodularity. No esophagitis. Small hiatal hernia. Stomach       appeared normal otherwise. Patent pylorus. Normal appearing for second       third portion of the duodenum, This was biopsied with a cold forceps for       histology. Estimated blood loss was minimal. This was biopsied with a       cold forceps for histology. Estimated blood loss was minimal. duodenum       biopsied as well. Impression:               - Mucosal changes in the esophagus suspicious for                            Barrett's esophagus?"biopsied. Small hiatal hernia.                            Status post duodenal biopsy                           status post duodenal biopsy. Moderate Sedation:      Moderate (conscious) sedation was personally administered by an       anesthesia professional. The following parameters were monitored: oxygen       saturation, heart rate, blood pressure, respiratory rate, EKG,  adequacy       of pulmonary ventilation, and response to care. Total physician       intraservice time was 13 minutes. Recommendation:           - Patient has a contact number available for                            emergencies. The signs and symptoms of potential                            delayed complications were discussed with the                            patient. Return to normal activities tomorrow.                             Written discharge instructions were provided to the                            patient.                           - Resume previous diet.                           - Continue present medications.                           - Await pathology results.                           - No repeat upper endoscopy.                           - Return to GI office (date not yet determined).                            See colonoscopy report. Procedure Code(s):        --- Professional ---                           [phone], Esophagogastroduodenoscopy, flexible,                            transoral; with biopsy, single or multiple Diagnosis Code(s):        --- Professional ---                           K22.8, Other specified diseases of esophagus                           D50.9, Iron deficiency anemia, unspecified CPT copyright 2016 American Medical Association. All rights reserved. The codes documented in this report are preliminary and upon coder review may  be revised to meet current compliance requirements. [name] Faraaz Wolin, MD [name], MD 05/27/2016 1:33:17 PM This report has been signed electronically. Number of Addenda: 0

## 2016-05-27 NOTE — Transfer of Care (Signed)
Immediate Anesthesia Transfer of Care Note  Patient: Dustin Roberts  Procedure(s) Performed: Procedure(s) with comments: COLONOSCOPY WITH PROPOFOL (N/A) - 1230  ESOPHAGOGASTRODUODENOSCOPY (EGD) WITH PROPOFOL (N/A) BIOPSY - Duodenal, esophagus POLYPECTOMY - recto-sigmoid polyp  Patient Location: PACU  Anesthesia Type:MAC  Level of Consciousness: awake, alert , oriented and patient cooperative  Airway & Oxygen Therapy: Patient Spontanous Breathing and Patient connected to nasal cannula oxygen  Post-op Assessment: Report given to RN and Post -op Vital signs reviewed and stable  Post vital signs: Reviewed and stable  Last Vitals:  Vitals:   05/27/16 1230 05/27/16 1235  BP: (!) 145/89 (!) 145/78  Pulse:    Resp: 20 20  Temp:      Last Pain:  Vitals:   05/27/16 1138  TempSrc: Oral      Patients Stated Pain Goal: 9 (23/95/32 0233)  Complications: No apparent anesthesia complications

## 2016-05-27 NOTE — Anesthesia Preprocedure Evaluation (Signed)
Anesthesia Evaluation  Patient identified by MRN, date of birth, ID band Patient awake    Reviewed: Allergy & Precautions, NPO status , Patient's Chart, lab work & pertinent test results  Airway Mallampati: I  TM Distance: >3 FB Neck ROM: Full    Dental  (+) Edentulous Upper, Edentulous Lower   Pulmonary sleep apnea ,    breath sounds clear to auscultation       Cardiovascular hypertension, Pt. on medications + CAD, + Past MI and +CHF   Rhythm:Regular Rate:Normal     Neuro/Psych  Headaches, Seizures -,  CVA    GI/Hepatic GERD  ,  Endo/Other  diabetes, Type 2, Oral Hypoglycemic Agents  Renal/GU      Musculoskeletal   Abdominal   Peds  Hematology  (+) anemia ,   Anesthesia Other Findings   Reproductive/Obstetrics                             Anesthesia Physical Anesthesia Plan  ASA: III  Anesthesia Plan: MAC   Post-op Pain Management:    Induction: Intravenous  Airway Management Planned: Simple Face Mask  Additional Equipment:   Intra-op Plan:   Post-operative Plan:   Informed Consent: I have reviewed the patients History and Physical, chart, labs and discussed the procedure including the risks, benefits and alternatives for the proposed anesthesia with the patient or authorized representative who has indicated his/her understanding and acceptance.     Plan Discussed with:   Anesthesia Plan Comments:         Anesthesia Quick Evaluation

## 2016-05-27 NOTE — Anesthesia Postprocedure Evaluation (Signed)
Anesthesia Post Note  Patient: SLATE DEBROUX  Procedure(s) Performed: Procedure(s) (LRB): COLONOSCOPY WITH PROPOFOL (N/A) ESOPHAGOGASTRODUODENOSCOPY (EGD) WITH PROPOFOL (N/A) BIOPSY POLYPECTOMY  Patient location during evaluation: PACU Anesthesia Type: MAC Level of consciousness: awake and alert and oriented Pain management: pain level controlled Vital Signs Assessment: post-procedure vital signs reviewed and stable Respiratory status: spontaneous breathing and patient connected to nasal cannula oxygen Cardiovascular status: stable Postop Assessment: no signs of nausea or vomiting Anesthetic complications: no     Last Vitals:  Vitals:   05/27/16 1230 05/27/16 1235  BP: (!) 145/89 (!) 145/78  Pulse:    Resp: 20 20  Temp:      Last Pain:  Vitals:   05/27/16 1138  TempSrc: Oral                 Idonna Heeren A

## 2016-05-27 NOTE — Interval H&P Note (Signed)
History and Physical Interval Note:  05/27/2016 12:28 PM  Dustin Roberts  has presented today for surgery, with the diagnosis of IDA/GERD/L SIDED PAIN  The various methods of treatment have been discussed with the patient and family. After consideration of risks, benefits and other options for treatment, the patient has consented to  Procedure(s) with comments: COLONOSCOPY WITH PROPOFOL (N/A) - 1230  ESOPHAGOGASTRODUODENOSCOPY (EGD) WITH PROPOFOL (N/A) as a surgical intervention .  The patient's history has been reviewed, patient examined, no change in status, stable for surgery.  I have reviewed the patient's chart and labs.  Questions were answered to the patient's satisfaction.     Dustin Roberts  Patient seen and examined. No change. Denies dysphagia. Diagnostic EGD with small bowel biopsy and colonoscopy per plan.  The risks, benefits, limitations, imponderables and alternatives regarding both EGD and colonoscopy have been reviewed with the patient. Questions have been answered. All parties agreeable.

## 2016-05-27 NOTE — Telephone Encounter (Signed)
Wife called.Pt nauseated and diaphoretic.  No abd or chest pain;no bleeding or fever.  Does have a headache. Not interested in eating.Tolerating liquids.  No vomiting .  Procedure notes reviewed.  Doubt procedure related complication  Suspect effects of anesthesia + -  Vasovagal episode.  I advised cl iquids only for this evening. Take 2 tylenol.  Get some rest. Call in am if no better.  If he  Develops any of the above sx he denies, they need to let me.

## 2016-05-27 NOTE — Op Note (Signed)
[facility] Patient Name: Dustin Roberts Procedure Date: 05/27/2016 12:57 PM MRN: [id number] Date of Birth: 08/16/1955 Attending MD: [name] , MD CSN: [id number] Age: 61 Admit Type: Outpatient Procedure:                Ileo-colonoscopy with snare polypectomy Indications:              Unexplained iron deficiency anemia Providers:                [name], MD, Nena Polio, RN, Burke Keels, Technician Referring MD:             Altamease Oiler Medicines:                Propofol per Anesthesia Complications:            No immediate complications. Estimated Blood Loss:     Estimated blood loss was minimal. Procedure:                Pre-Anesthesia Assessment:                           - Prior to the procedure, a History and Physical                            was performed, and patient medications and                            allergies were reviewed. The patient's tolerance of                            previous anesthesia was also reviewed. The risks                            and benefits of the procedure and the sedation                            options and risks were discussed with the patient.                            All questions were answered, and informed consent                            was obtained. Prior Anticoagulants: The patient has                            taken no previous anticoagulant or antiplatelet                            agents. ASA Grade Assessment: II - A patient with                            mild systemic disease. After reviewing the risks  and benefits, the patient was deemed in                            satisfactory condition to undergo the procedure.                           After obtaining informed consent, the colonoscope                            was passed under direct vision. Throughout the                            procedure, the patient's blood pressure,  pulse, and                            oxygen saturations were monitored continuously. The                            [id number]) was introduced through the and                            advanced to the 5 cm into the ileum. The terminal                            ileum, ileocecal valve, appendiceal orifice, and                            rectum were photographed. The entire colon was well                            visualized. The colonoscopy was performed without                            difficulty. The patient tolerated the procedure                            well. The quality of the bowel preparation was                            adequate. Scope In: 1:01:43 PM Scope Out: 1:24:11 PM Scope Withdrawal Time: 0 hours 11 minutes 28 seconds  Total Procedure Duration: 0 hours 22 minutes 28 seconds  Findings:      The perianal and digital rectal examinations were normal.      Scattered small and large-mouthed diverticula were found in the sigmoid       colon and descending colon.      A 4 mm polyp was found in the recto-sigmoid colon. The polyp was       semi-pedunculated.      Internal hemorrhoids were found during retroflexion. The hemorrhoids       were mild, small and Grade I (internal hemorrhoids that do not       prolapse). The polyp was removed with a cold snare. Resection and       retrieval were complete. Estimated blood loss was minimal. Normal distal  5 cm of terminal ileal mucosa.      The exam was otherwise without abnormality on direct and retroflexion       views. Impression:               - Diverticulosis in the sigmoid colon and in the                            descending colon.                           - One 4 mm polyp at the recto-sigmoid colon,                            removed with a cold snare. Resected and retrieved.                           - Internal hemorrhoids. Moderate Sedation:      Moderate (conscious) sedation was personally administered  by an       anesthesia professional. The following parameters were monitored: oxygen       saturation, heart rate, blood pressure, respiratory rate, EKG, adequacy       of pulmonary ventilation, and response to care. Total physician       intraservice time was 43 minutes. Recommendation:           - Repeat colonoscopy date to be determined after                            pending pathology results are reviewed for                            surveillance.                           - Return to GI office (date not yet determined).                            See EGD report                           - Advance diet as tolerated. Procedure Code(s):        --- Professional ---                           [id number], Colonoscopy, flexible; with removal of                            tumor(s), polyp(s), or other lesion(s) by snare                            technique Diagnosis Code(s):        --- Professional ---                           K64.0, First degree hemorrhoids  D12.7, Benign neoplasm of rectosigmoid junction                           D50.9, Iron deficiency anemia, unspecified                           K57.30, Diverticulosis of large intestine without                            perforation or abscess without bleeding CPT copyright 2016 American Medical Association. All rights reserved. The codes documented in this report are preliminary and upon coder review may  be revised to meet current compliance requirements. [name] [name], MD [name], MD 05/27/2016 1:38:38 PM This report has been signed electronically. Number of Addenda: 0

## 2016-05-27 NOTE — H&P (View-Only) (Signed)
Primary Care Physician:  Akinleye, Akintunde Rasaq, MD  Primary Gastroenterologist:  Michael Rourk, MD   Chief Complaint  Patient presents with  . Anemia    referred for tcs/egd. Had tcs approx 10 yrs ago (Dr.Patel)  . Emesis  . Abdominal Pain  . Diarrhea    HPI:  Dustin Roberts is a 60 y.o. male here At the request of Dr.Akinleye for further evaluation of iron deficiency anemia. Recently established care with hematology for chronic anemia. Remote colonoscopy over 10 years ago in [place]. Patient states it was a routine exam and he reports that couple polyps. We do not have those records.. Recent labs indicated significant deficiency anemia with ferritin of 5. Hemoglobin 8.1, hematocrit 30, MCV 70.9. B12 243, folate 12.3. Patient was offered blood transfusions but he declined. He has received 2 IV iron infusions however. Clinically he states he feels less fatigued. His hemoglobin was 9.9 on April 17.  Patient states he was told a long time. Ulcers. He denies prior upper endoscopy. He denies melena or rectal bleeding. Typically has anywhere from 0 bowel movements to 2-3 runny stools daily. Denies nocturnal stools. States he's been on metformin for several years. Denies recent medication changes. Stools have been running for several months. States he has a lot of heartburn Protonix. He has had several month history of left-sided abdominal discomfort, and not made better or worse with any particular thing. Doesn't take any medication for it. Describes as "it just hurts". No weight loss. He states he was told not to take iron now that he has received iron infusions.  Current Outpatient Prescriptions  Medication Sig Dispense Refill  . acetaminophen (TYLENOL) 500 MG tablet Take 500 mg by mouth every 6 (six) hours as needed for mild pain or moderate pain.    . insulin aspart (NOVOLOG FLEXPEN) 100 UNIT/ML FlexPen Inject 10 Units into the skin 3 (three) times daily before meals.    . Insulin Glargine  (LANTUS SOLOSTAR) 100 UNIT/ML Solostar Pen Inject 50 Units into the skin every morning.    . lisinopril (PRINIVIL,ZESTRIL) 40 MG tablet Take 40 mg by mouth every evening.   3  . loratadine (ALLERGY RELIEF) 10 MG tablet Take 10 mg by mouth daily.    . metFORMIN (GLUCOPHAGE) 1000 MG tablet Take 1,000 mg by mouth 2 (two) times daily.  3  . pantoprazole (PROTONIX) 40 MG tablet Take 40 mg by mouth every evening.   3  . simvastatin (ZOCOR) 40 MG tablet Take 40 mg by mouth every evening.   3  . albuterol (PROVENTIL) (2.5 MG/3ML) 0.083% nebulizer solution Take 3 mLs (2.5 mg total) by nebulization every 6 (six) hours as needed for wheezing. (Patient not taking: Reported on 02/11/2015) 75 mL 1  . ipratropium (ATROVENT) 0.02 % nebulizer solution Take 2.5 mLs (500 mcg total) by nebulization 4 (four) times daily. 75 mL 1   No current facility-administered medications for this visit.     Allergies as of 05/25/2016  . (No Known Allergies)    Past Medical History:  Diagnosis Date  . Arthritis   . CHF (congestive heart failure) (HCC)   . Diabetes mellitus   . Hypertension   . IDA (iron deficiency anemia)   . Stroke (HCC)    Due to brain aneurysm    Past Surgical History:  Procedure Laterality Date  . APPENDECTOMY    . BRAIN SURGERY     aneurysm, age 29  . CEREBRAL ANEURYSM REPAIR     Second   procedure was gamma knife repair  . FRACTURE SURGERY     left wrist  . HERNIA REPAIR     left inguinal and umbilical  . KNEE SURGERY Left    twice  . ROTATOR CUFF REPAIR Left     Family History  Problem Relation Age of Onset  . Colon cancer Neg Hx     Social History   Social History  . Marital status: Married    Spouse name: N/A  . Number of children: N/A  . Years of education: N/A   Occupational History  . Not on file.   Social History Main Topics  . Smoking status: Never Smoker  . Smokeless tobacco: Never Used  . Alcohol use No  . Drug use: No  . Sexual activity: Not on file    Other Topics Concern  . Not on file   Social History Narrative  . No narrative on file      ROS: General: Negative for anorexia, weight loss, fever, chills, fatigue, weakness. Eyes: Negative for vision changes.  ENT: Negative for hoarseness, difficulty swallowing , nasal congestion. CV: Negative for chest pain, angina, palpitations, dyspnea on exertion, peripheral edema.  Respiratory: Negative for dyspnea at rest, dyspnea on exertion, cough, sputum, wheezing.  GI: See history of present illness. GU:  Negative for dysuria, hematuria, urinary incontinence, urinary frequency, nocturnal urination.  MS: Negative for joint pain, low back pain.  Derm: Negative for rash or itching.  Neuro: Negative for weakness, abnormal sensation, seizure, frequent headaches, memory loss, confusion. Occasional dizziness Psych: Negative for anxiety, depression, suicidal ideation, hallucinations.  Endo: Negative for unusual weight change.  Heme: Negative for bruising or bleeding. Allergy: Negative for rash or hives.    Physical Examination:  BP 128/73   Pulse 65   Temp 97.8 F (36.6 C) (Oral)   Ht 5' 11.5" (1.816 m)   Wt 223 lb 3.2 oz (101.2 kg)   BMI 30.70 kg/m    General: Well-nourished, well-developed in no acute distress. Accompanied by wife and 2 granddaughters. Head: Normocephalic, atraumatic.   Eyes: Conjunctiva pink, no icterus. Mouth: Oropharyngeal mucosa moist and pink , no lesions erythema or exudate. Neck: Supple without thyromegaly, masses, or lymphadenopathy.  Lungs: Clear to auscultation bilaterally.  Heart: Regular rate and rhythm, no murmurs rubs or gallops.  Abdomen: Bowel sounds are normal, nontender, nondistended, no hepatosplenomegaly or masses, no abdominal bruits or    hernia , no rebound or guarding.   Rectal: Not performed Extremities: No lower extremity edema. No clubbing or deformities.  Neuro: Alert and oriented x 4 , grossly normal neurologically.  Skin: Warm and  dry, no rash or jaundice.   Psych: Alert and cooperative, normal mood and affect.  Labs: Labs from April 2018, ferritin 6.4, white blood cell count 5540, hemoglobin 9.9, hematocrit 74, platelets 204,000. According to records from [facility] medical oncology and hematology, patient also had previous April labs with folate 12.3, vitamin B12 243, total bilirubin 0.2, alkaline phosphatase 108, AST 24, ALT 37, albumin 3.9, creatinine 1.0, iron saturations 52%  Imaging Studies: No results found.   

## 2016-05-27 NOTE — Anesthesia Procedure Notes (Signed)
Procedure Name: MAC Date/Time: 05/27/2016 12:38 PM Performed by: Andree Elk, AMY A Pre-anesthesia Checklist: Patient identified, Emergency Drugs available, Suction available, Patient being monitored and Timeout performed Oxygen Delivery Method: Simple face mask

## 2016-05-27 NOTE — Discharge Instructions (Signed)
°Colonoscopy °Discharge Instructions ° °Read the instructions outlined below and refer to this sheet in the next few weeks. These discharge instructions provide you with general information on caring for yourself after you leave the hospital. Your doctor may also give you specific instructions. While your treatment has been planned according to the most current medical practices available, unavoidable complications occasionally occur. If you have any problems or questions after discharge, call Dr. Skylee Baird at [phone]. °ACTIVITY °· You may resume your regular activity, but move at a slower pace for the next 24 hours.  °· Take frequent rest periods for the next 24 hours.  °· Walking will help get rid of the air and reduce the bloated feeling in your belly (abdomen).  °· No driving for 24 hours (because of the medicine (anesthesia) used during the test).   °· Do not sign any important legal documents or operate any machinery for 24 hours (because of the anesthesia used during the test).  °NUTRITION °· Drink plenty of fluids.  °· You may resume your normal diet as instructed by your doctor.  °· Begin with a light meal and progress to your normal diet. Heavy or fried foods are harder to digest and may make you feel sick to your stomach (nauseated).  °· Avoid alcoholic beverages for 24 hours or as instructed.  °MEDICATIONS °· You may resume your normal medications unless your doctor tells you otherwise.  °WHAT YOU CAN EXPECT TODAY °· Some feelings of bloating in the abdomen.  °· Passage of more gas than usual.  °· Spotting of blood in your stool or on the toilet paper.  °IF YOU HAD POLYPS REMOVED DURING THE COLONOSCOPY: °· No aspirin products for 7 days or as instructed.  °· No alcohol for 7 days or as instructed.  °· Eat a soft diet for the next 24 hours.  °FINDING OUT THE RESULTS OF YOUR TEST °Not all test results are available during your visit. If your test results are not back during the visit, make an appointment  with your caregiver to find out the results. Do not assume everything is normal if you have not heard from your caregiver or the medical facility. It is important for you to follow up on all of your test results.  °SEEK IMMEDIATE MEDICAL ATTENTION IF: °· You have more than a spotting of blood in your stool.  °· Your belly is swollen (abdominal distention).  °· You are nauseated or vomiting.  °· You have a temperature over 101.  °· You have abdominal pain or discomfort that is severe or gets worse throughout the day.  °EGD °Discharge instructions °Please read the instructions outlined below and refer to this sheet in the next few weeks. These discharge instructions provide you with general information on caring for yourself after you leave the hospital. Your doctor may also give you specific instructions. While your treatment has been planned according to the most current medical practices available, unavoidable complications occasionally occur. If you have any problems or questions after discharge, please call your doctor. °ACTIVITY °· You may resume your regular activity but move at a slower pace for the next 24 hours.  °· Take frequent rest periods for the next 24 hours.  °· Walking will help expel (get rid of) the air and reduce the bloated feeling in your abdomen.  °· No driving for 24 hours (because of the anesthesia (medicine) used during the test).  °· You may shower.  °· Do not sign any important   legal documents or operate any machinery for 24 hours (because of the anesthesia used during the test).  NUTRITION  Drink plenty of fluids.   You may resume your normal diet.   Begin with a light meal and progress to your normal diet.   Avoid alcoholic beverages for 24 hours or as instructed by your caregiver.  MEDICATIONS  You may resume your normal medications unless your caregiver tells you otherwise.  WHAT YOU CAN EXPECT TODAY  You may experience abdominal discomfort such as a feeling of fullness  or gas pains.  FOLLOW-UP  Your doctor will discuss the results of your test with you.  SEEK IMMEDIATE MEDICAL ATTENTION IF ANY OF THE FOLLOWING OCCUR:  Excessive nausea (feeling sick to your stomach) and/or vomiting.   Severe abdominal pain and distention (swelling).   Trouble swallowing.   Temperature over 101 F (37.8 C).   Rectal bleeding or vomiting of blood.    Colon polyp and diverticulosis information provided  Further recommendations to follow pending review of pathology report  You may need further evaluation of your small intestine as previously discussed.

## 2016-05-28 NOTE — Addendum Note (Signed)
Addendum  created 05/28/16 0845 by Earleen NewportAdams, Hiroko Tregre A, CRNA   Charge Capture section accepted

## 2016-06-01 ENCOUNTER — Encounter: Payer: Self-pay | Admitting: Internal Medicine

## 2016-06-02 ENCOUNTER — Telehealth: Payer: Self-pay

## 2016-06-02 ENCOUNTER — Other Ambulatory Visit: Payer: Self-pay

## 2016-06-02 DIAGNOSIS — D509 Iron deficiency anemia, unspecified: Secondary | ICD-10-CM

## 2016-06-02 NOTE — Telephone Encounter (Signed)
Pt's wife called office. Pt has been having black stools for past 3 days, this is new since he had his procedure last week. He went to ER yesterday, but left d/t wait time. He's feeling tired all the time. Informed his wife of recommendation to schedule Givens Capsule. She is ok with proceeding with the Givens Capsule. Givens Capsule scheduled for 06/08/16 at 7:30am, pt to arrive at 7:00am. Instructions for Givens given on phone to his wife and also mailed to [address]; [place], [place] [id number] (per wife's request). His wife stated he is not taking Iron supplement, Carafate, Aspirin or arthritis medication. Orders entered for Givens. Time slots blocked on AB schedule for 06/09/16 and 06/10/16 to read Givens.  Pt's wife was wondering if anything else needed to be done in the meantime prior to [place]. Routing message to RMR.

## 2016-06-02 NOTE — Telephone Encounter (Signed)
Letter mailed to the pt. 

## 2016-06-02 NOTE — Telephone Encounter (Signed)
Per RMR- Send letter to patient.  Send copy of letter with path to referring provider and PCP.   I think we need to proceed with a small bowel capsule study

## 2016-06-02 NOTE — Telephone Encounter (Signed)
Schedule Givens capsule per RMR. Dx: IDA

## 2016-06-02 NOTE — Telephone Encounter (Signed)
Attempted to submit PA for Givens Capsule via [employer] website, no PA needed.

## 2016-06-03 ENCOUNTER — Encounter (HOSPITAL_COMMUNITY): Payer: Self-pay | Admitting: Internal Medicine

## 2016-06-08 ENCOUNTER — Ambulatory Visit (HOSPITAL_COMMUNITY)
Admission: RE | Admit: 2016-06-08 | Discharge: 2016-06-08 | Disposition: A | Discharge status: Home / Self Care | Payer: Medicaid Other | Source: Ambulatory Visit | Attending: Internal Medicine | Admitting: Internal Medicine

## 2016-06-08 ENCOUNTER — Encounter (HOSPITAL_COMMUNITY)
Admission: RE | Disposition: A | Discharge status: Home / Self Care | Payer: Self-pay | Source: Ambulatory Visit | Attending: Internal Medicine

## 2016-06-08 DIAGNOSIS — D509 Iron deficiency anemia, unspecified: Secondary | ICD-10-CM | POA: Insufficient documentation

## 2016-06-08 DIAGNOSIS — D508 Other iron deficiency anemias: Secondary | ICD-10-CM | POA: Diagnosis not present

## 2016-06-08 HISTORY — PX: GIVENS CAPSULE STUDY: SHX5432

## 2016-06-08 SURGERY — IMAGING PROCEDURE, GI TRACT, INTRALUMINAL, VIA CAPSULE

## 2016-06-08 MED ORDER — SIMETHICONE 40 MG/0.6ML PO SUSP
ORAL | Status: AC
  Filled 2016-06-08: qty 0.6

## 2016-06-11 ENCOUNTER — Telehealth: Payer: Self-pay

## 2016-06-11 ENCOUNTER — Other Ambulatory Visit: Payer: Self-pay

## 2016-06-11 ENCOUNTER — Encounter (HOSPITAL_COMMUNITY): Payer: Self-pay | Admitting: Internal Medicine

## 2016-06-11 ENCOUNTER — Emergency Department (HOSPITAL_COMMUNITY)
Admission: EM | Admit: 2016-06-11 | Discharge: 2016-06-11 | Disposition: A | Discharge status: Home / Self Care | Payer: Medicaid Other

## 2016-06-11 ENCOUNTER — Ambulatory Visit (HOSPITAL_COMMUNITY)
Admission: RE | Admit: 2016-06-11 | Discharge: 2016-06-11 | Disposition: A | Discharge status: Home / Self Care | Payer: Medicaid Other | Source: Ambulatory Visit | Attending: Gastroenterology | Admitting: Gastroenterology

## 2016-06-11 DIAGNOSIS — K8020 Calculus of gallbladder without cholecystitis without obstruction: Secondary | ICD-10-CM | POA: Diagnosis not present

## 2016-06-11 DIAGNOSIS — N281 Cyst of kidney, acquired: Secondary | ICD-10-CM | POA: Insufficient documentation

## 2016-06-11 DIAGNOSIS — I700 Atherosclerosis of aorta: Secondary | ICD-10-CM | POA: Diagnosis not present

## 2016-06-11 DIAGNOSIS — R109 Unspecified abdominal pain: Secondary | ICD-10-CM

## 2016-06-11 MED ORDER — IOPAMIDOL (ISOVUE-300) INJECTION 61%
125.0000 mL | Freq: Once | INTRAVENOUS | Status: AC | PRN
  Administered 2016-06-11: 125 mL via INTRAVENOUS

## 2016-06-11 MED ORDER — IOPAMIDOL (ISOVUE-300) INJECTION 61%: 100.0000 mL | Freq: Once | INTRAVENOUS | Status: DC | PRN

## 2016-06-11 MED ORDER — IOPAMIDOL (ISOVUE-300) INJECTION 61%: 125.0000 mL | Freq: Once | INTRAVENOUS | Status: DC | PRN

## 2016-06-11 MED ORDER — ONDANSETRON HCL 4 MG PO TABS: 4.0000 mg | ORAL_TABLET | Freq: Three times a day (TID) | ORAL | 1 refills | Status: DC | PRN

## 2016-06-11 NOTE — Telephone Encounter (Signed)
Pt's wife is aware and they are on the way to radiology now. The PA# [id number]

## 2016-06-11 NOTE — Telephone Encounter (Signed)
Wife called office x 2 wanting to speak to AB about pt's CT results. Informed her that AB was gone for the day. Told her that CT didn't show anything to explain his symptoms. Advised her if pt couldn't wait until in the morning for AB to give final results or if he gets worse he should go to the ER.

## 2016-06-11 NOTE — Telephone Encounter (Signed)
Forward this communication to extender reading capsule study place.

## 2016-06-11 NOTE — Telephone Encounter (Signed)
Noted will do.  

## 2016-06-11 NOTE — Procedures (Signed)
Small Bowel Givens Capsule Study Procedure date:  06/08/16  Referring Provider:  Dr. Jena Gaussourk  PCP:  Dr. Regan LemmingAkinleye, Merilynn FinlandAkintunde Rasaq, MD  Indication for procedure:   61 year old male with history of significant IDA with ferritin of 5, Hgb 8.1, receiving 2 iron infusions elsewhere and undergoing colonoscopy/EGD recently without significant findings to explain IDA. Most recent Hgb 13 in may 2018, ferritin 25. No evidence of celiac sprue on duodenal biopsies. Recently stating he has now seen dark stool. Capsule study now indicated to assess for small bowel etiology.   Findings:   Capsule study is complete to the cecum. There are no obvious large AVMs or obvious areas of active bleeding. Scattered superficial erosions and "specks" of blood in more proximal small bowel but not appearing clinically significant. Starting at 02:00:58, areas of abnormal small bowel, edematous appearing and non-specific.    Summary & Recommendations: Abnormal small bowel of uncertain significance. No obvious overt GI bleeding, AVMs. No abdominal imaging on file. Check CBC today, proceed with CT enterography. Further recommendations to follow.  Gelene MinkAnna W. Marinus Eicher, PhD, ANP-BC [facility]   ADDENDUM: CTE reviewed. No small bowel abnormality. Patient is avoiding NSAIDs, aspirin powders. Wife states he is on a PPI, but I do not see this listed on med list. She told me he is on Protonix. We will have him return to office, check hemoccult at that time. Will need updated CBC.   Gelene MinkAnna W. Makinzee Durley, PhD, ANP-BC [facility]

## 2016-06-11 NOTE — Telephone Encounter (Signed)
Pt's wife called to see if the GIVENS results are back. SHe said he cam home yesterday  Sick and throwing up. He is hurting in his R side and R arm. When he tried to eat supper he throw it back up. She also said that his bowel movements are dark. She is just worried about him. Please advise

## 2016-06-11 NOTE — ED Triage Notes (Signed)
Patient states he had CT scan today at 1500 and was called to come to ER. States he was not given a reason.

## 2016-06-11 NOTE — Telephone Encounter (Signed)
Capsule study read yesterday afternoon.   There are no obvious AVMs or obvious areas of bleeding. I did see some superficial erosions and "specks" of blood in proximal bowel but did not appear clinically significant.  However, I did note in more distal small bowel several images of abnormal bowel mucosa, edematous appearing and  difficult to tell what is going on from capsule study alone. He has not had any abdominal imaging that I am aware. He had profound IDA prior to seeing us, with colonoscopy/EGD unrevealing. I reviewed notes, as I have never seen him personally in clinic. He has had abdominal pain which is unexplained by procedures thus far.   He needs abdominal imaging with dedicated view of small bowel. Would recommend CT enterography ASAP as next step so we can further evaluate small bowel and see if findings are clinically significant from capsule.   In the interim: stick with clear liquids for next 24 hours, advancing to soft foods. Needs to stay hydrated. If he has severe symptoms and unable to tolerate po, would need ED evaluation. Watch for black, tarry stool. Check CBC now. Avoid all NSAIDs, aspirin powders. Zofran sent to pharmacy for supportive measures.

## 2016-06-12 NOTE — Telephone Encounter (Signed)
Spoke with wife. CTE reviewed. No obvious abnormalities. He needs to come back in and be seen next week. May use urgent if needed for whatever is available. He has seen Verlon AuLeslie in clinic previously.

## 2016-06-15 NOTE — Telephone Encounter (Signed)
PATIENT COMING 06/16/16 9:30AM

## 2016-06-16 ENCOUNTER — Ambulatory Visit (INDEPENDENT_AMBULATORY_CARE_PROVIDER_SITE_OTHER): Payer: Medicaid Other | Admitting: Gastroenterology

## 2016-06-16 ENCOUNTER — Encounter: Payer: Self-pay | Admitting: Gastroenterology

## 2016-06-16 VITALS — BP 149/73 | HR 46 | Temp 97.0°F | Ht 68.0 in | Wt 216.8 lb

## 2016-06-16 DIAGNOSIS — R112 Nausea with vomiting, unspecified: Secondary | ICD-10-CM | POA: Insufficient documentation

## 2016-06-16 DIAGNOSIS — D500 Iron deficiency anemia secondary to blood loss (chronic): Secondary | ICD-10-CM | POA: Diagnosis not present

## 2016-06-16 DIAGNOSIS — R109 Unspecified abdominal pain: Secondary | ICD-10-CM | POA: Diagnosis not present

## 2016-06-16 LAB — HEPATIC FUNCTION PANEL
ALBUMIN: 3.9 g/dL (ref 3.6–5.1)
ALK PHOS: 71 U/L (ref 40–115)
ALT: 17 U/L (ref 9–46)
AST: 15 U/L (ref 10–35)
BILIRUBIN INDIRECT: 0.3 mg/dL (ref 0.2–1.2)
Bilirubin, Direct: 0.1 mg/dL (ref <=0.2)
TOTAL PROTEIN: 6.6 g/dL (ref 6.1–8.1)
Total Bilirubin: 0.4 mg/dL (ref 0.2–1.2)

## 2016-06-16 LAB — CBC WITH DIFFERENTIAL/PLATELET
BASOS PCT: 1 %
Basophils Absolute: 43 cells/uL (ref 0–200)
EOS PCT: 3 %
Eosinophils Absolute: 129 cells/uL (ref 15–500)
HCT: 35.8 % — ABNORMAL LOW (ref 38.5–50.0)
HEMOGLOBIN: 11.2 g/dL — AB (ref 13.2–17.1)
LYMPHS ABS: 1462 {cells}/uL (ref 850–3900)
Lymphocytes Relative: 34 %
MCH: 24.3 pg — AB (ref 27.0–33.0)
MCHC: 31.3 g/dL — AB (ref 32.0–36.0)
MCV: 77.7 fL — ABNORMAL LOW (ref 80.0–100.0)
MONOS PCT: 8 %
Monocytes Absolute: 344 cells/uL (ref 200–950)
NEUTROS ABS: 2322 {cells}/uL (ref 1500–7800)
NEUTROS PCT: 54 %
PLATELETS: 156 10*3/uL (ref 140–400)
RBC: 4.61 MIL/uL (ref 4.20–5.80)
RDW: 19.5 % — AB (ref 11.0–15.0)
WBC: 4.3 10*3/uL (ref 3.8–10.8)

## 2016-06-16 LAB — IRON AND TIBC
%SAT: 7 % — AB (ref 15–60)
IRON: 25 ug/dL — AB (ref 50–180)
TIBC: 378 ug/dL (ref 250–425)
UIBC: 353 ug/dL

## 2016-06-16 LAB — TSH: TSH: 1.82 mIU/L (ref 0.40–4.50)

## 2016-06-16 NOTE — Progress Notes (Signed)
Primary Care Physician: Altamease OilerHarris, Meredith L, FNP  Primary Gastroenterologist:  Roetta SessionsMichael Rourk, MD   Chief Complaint  Patient presents with  . Abdominal Pain    HPI: Dustin CoriaJohn H Roberts is a 61 y.o. male here for follow-up. He was seen May 21 for further evaluation of iron deficiency anemia. Ferritin 5, hemoglobin 8.1, MCV 70.9, B12 241, folate 12.3. He was offered blood transfusions by hematology but he declined. He received 2 IV iron infusions instead. Hemoglobin 9.9 in April. When I saw him in May he had a several month history of left-sided abdominal pain. No weight loss. Somewhat loose stools.  May 23 he had EGD and colonoscopy, mucosal changes in the esophagus suspicious for Barrett's, biopsy showed reflux changes only. He had a small hiatal hernia. Duodenal biopsy negative for celiac. Colonoscopy showed 4 mm rectosigmoid hyperplastic polyp, diverticulosis, internal hemorrhoids. Small bowel capsule study was done on June 4 showing scattered superficial erosions and "specks of blood" in the more proximal small bowel. The distal small bowel there was an edematous appearing but nonspecific small bowel. CTE performed June 7 showing tiny calcified gallstone in the gallbladder, small bowel looked fine.  Patient has been quite fatigued, sleeping a lot. Wife states he's been off balance and has fallen a couple of times. Patient states that he doesn't stay around the house, he is on the go all the time. He is a [employer] and does jobs as much as he can. He doesn't like sitting around. Several weeks ago he had black stools on several occasions. Denies Pepto-Bismol use. Rarely takes Aleve. He has had nausea with vomiting. Describes early satiety. No heartburn on PPI therapy. Denies dysphagia. Bowel movement every day to every other day, sometimes loose. Specifically very loose after contrast for CT recently.  Current Outpatient Prescriptions  Medication Sig Dispense Refill  . insulin aspart (NOVOLOG  FLEXPEN) 100 UNIT/ML FlexPen Inject 10 Units into the skin 3 (three) times daily before meals.    . insulin aspart (NOVOLOG) 100 UNIT/ML injection Inject 10 Units into the skin 4 (four) times daily -  before meals and at bedtime.    Marland Kitchen. LANTUS 100 UNIT/ML injection Inject 50 Units into the skin daily.  3  . lisinopril (PRINIVIL,ZESTRIL) 40 MG tablet Take 40 mg by mouth every evening.   3  . loratadine (ALLERGY RELIEF) 10 MG tablet Take 10 mg by mouth every evening.     . Melatonin 10 MG TABS Take 10 mg by mouth at bedtime.    . metFORMIN (GLUCOPHAGE) 1000 MG tablet Take 1,000 mg by mouth 2 (two) times daily.  3  . naproxen sodium (ANAPROX) 220 MG tablet Take 220-440 mg by mouth 2 (two) times daily as needed (for pain).    . ondansetron (ZOFRAN) 4 MG tablet Take 1 tablet (4 mg total) by mouth every 8 (eight) hours as needed for nausea or vomiting. 30 tablet 1  . pantoprazole (PROTONIX) 40 MG tablet Take 40 mg by mouth every evening.   3  . simvastatin (ZOCOR) 40 MG tablet Take 40 mg by mouth every evening.   3   No current facility-administered medications for this visit.     Allergies as of 06/16/2016 - Review Complete 06/16/2016  Allergen Reaction Noted  . Morphine and related  05/25/2016    ROS:  General: Negative for anorexia, weight loss, fever, chills, fatigue, weakness. ENT: Negative for hoarseness, difficulty swallowing , nasal congestion. CV: Negative for chest pain, angina, palpitations,  dyspnea on exertion, peripheral edema.  Respiratory: Negative for dyspnea at rest, dyspnea on exertion, cough, sputum, wheezing.  GI: See history of present illness. GU:  Negative for dysuria, hematuria, urinary incontinence, urinary frequency, nocturnal urination.  Endo: Negative for unusual weight change.    Physical Examination:   BP (!) 149/73   Pulse (!) 46   Temp 97 F (36.1 C) (Oral)   Ht 5\' 8"  (1.727 m)   Wt 216 lb 12.8 oz (98.3 kg)   BMI 32.96 kg/m   General: Well-nourished,  well-developed in no acute distress.  Eyes: No icterus. Mouth: Oropharyngeal mucosa moist and pink , no lesions erythema or exudate. Lungs: Clear to auscultation bilaterally.  Heart: Regular rate and rhythm, no murmurs rubs or gallops.  Abdomen: Bowel sounds are normal, nontender, nondistended, no hepatosplenomegaly or masses, no abdominal bruits or hernia , no rebound or guarding.   Extremities: No lower extremity edema. No clubbing or deformities. Neuro: Alert and oriented x 4   Skin: Warm and dry, no jaundice.   Psych: Alert and cooperative, normal mood and affect.  Labs:  Lab Results  Component Value Date   CREATININE 1.00 05/26/2016   BUN 9 05/26/2016   NA 134 (L) 05/26/2016   K 3.7 05/26/2016   CL 100 (L) 05/26/2016   CO2 24 05/26/2016      Imaging Studies: Ct Entero Abd/pelvis W Contast  Result Date: 06/11/2016 CLINICAL DATA:  61 year old male with generalized abdominal pain. EXAM: CT ABDOMEN AND PELVIS WITH CONTRAST (ENTEROGRAPHY) TECHNIQUE: Multidetector CT of the abdomen and pelvis during bolus administration of intravenous contrast. Negative oral contrast was given. CONTRAST:  ISOVUE-300 IOPAMIDOL (ISOVUE-300) INJECTION 61% COMPARISON:  No priors. FINDINGS: Lower chest:  Unremarkable. Hepatobiliary: No cystic or solid hepatic lesions. No intra or extrahepatic biliary ductal dilatation. Tiny calcified gallstone lying dependently in the gallbladder. No findings to suggest an acute cholecystitis at this time. Pancreas: No pancreatic mass. No pancreatic ductal dilatation. No pancreatic or peripancreatic fluid or inflammatory changes. Spleen: Unremarkable. Adrenals/Urinary Tract: 2.3 cm simple cyst in the interpolar region of the left kidney. Right kidney and bilateral adrenal glands are normal in appearance. No hydroureteronephrosis. Urinary bladder is nearly decompressed, but otherwise unremarkable in appearance. Stomach/Bowel: Normal appearance of the stomach. No pathologic  dilatation of small bowel or colon. Enterography images demonstrate no definite focal areas of mural thickening or surrounding inflammatory changes associated with either the small bowel or colon. Terminal ileum is grossly normal in appearance. The appendix is not confidently identified and may be surgically absent. Regardless, there are no inflammatory changes noted adjacent to the cecum to suggest the presence of an acute appendicitis at this time. Vascular/Lymphatic: Aortic atherosclerosis (mild), without evidence of aneurysm or dissection in the abdominal or pelvic vasculature. No lymphadenopathy noted in the abdomen or pelvis. Reproductive: Prostate gland and seminal vesicles are unremarkable in appearance. Other: No significant volume of ascites.  No pneumoperitoneum. Musculoskeletal: Well-defined 13 mm sclerotic lesion in the right side of the L5 vertebral body demonstrates a narrow zone of transition, likely to reflect a small bone island. No other aggressive appearing lytic or blastic lesions are noted elsewhere in the visualized portions of the skeleton. IMPRESSION: 1. No acute findings are noted in the abdomen or pelvis to account for the patient's symptoms. 2. Aortic atherosclerosis (mild). 3. Cholelithiasis without evidence of acute cholecystitis at this time. 4. Additional incidental findings, as above. Electronically Signed   By: Trudie Reed M.D.   On: 06/11/2016  15:27       

## 2016-06-16 NOTE — Patient Instructions (Addendum)
1. Please have your labs done.  2. Start taking pantoprazole once daily BEFORE breakfast. If you don't eat breakfast, at least eat a few crackers after you take your medication.  3. Further recommendations to follow once labs are done.

## 2016-06-17 LAB — FERRITIN: FERRITIN: 7 ng/mL — AB (ref 20–380)

## 2016-06-17 LAB — TISSUE TRANSGLUTAMINASE, IGA: Tissue Transglutaminase Ab, IgA: 1 U/mL (ref <4)

## 2016-06-17 LAB — LIPASE: Lipase: 18 U/L (ref 7–60)

## 2016-06-18 LAB — IGA: IgA: 257 mg/dL (ref 81–463)

## 2016-06-19 ENCOUNTER — Encounter: Payer: Self-pay | Admitting: Gastroenterology

## 2016-06-19 NOTE — Assessment & Plan Note (Signed)
61 year old gentleman with profound iron deficiency anemia, receiving iron infusions ordered by hematology, left-sided abdominal pain, intermittent vomiting, reported melena several weeks ago who presents for follow-up. Colonoscopy, EGD, capsule endoscopy without any significant findings to explain his iron deficiency anemia or symptoms outside of reflux esophagitis.  We will update labs. Advised him to take his pantoprazole 40 mg 30 minutes before breakfast each day to see if this would help with vomiting. For completeness we'll ask him to perform I FOBT as to date we have not documented GI bleeding.

## 2016-06-22 NOTE — Progress Notes (Signed)
CC'ED TO PCP 

## 2016-06-23 ENCOUNTER — Telehealth: Payer: Self-pay

## 2016-06-23 NOTE — Telephone Encounter (Signed)
Routing to LSL 

## 2016-06-23 NOTE — Telephone Encounter (Signed)
pts wife, Babette Relicammy called asking about labs the pt had done. Informed her of hemoglobin and iron. She said the pt has been vomiting since he had his procedure done in May. He is taking the zofran as prescribed but it is not helping. He has not seen any blood in his stools but stools are loose and dark. He is not taking pepto. She said she has tried to get him to go to the ED but he wont go. She said he is very stubborn.

## 2016-06-23 NOTE — Telephone Encounter (Signed)
Have LSL make contact with him tomorrow and get more information.

## 2016-06-23 NOTE — Telephone Encounter (Signed)
Routing to AB and RMR- can this wait until LSL comes in tomorrow?

## 2016-06-24 MED ORDER — PROMETHAZINE HCL 12.5 MG PO TABS: 12.5000 mg | ORAL_TABLET | Freq: Four times a day (QID) | ORAL | 0 refills | Status: DC | PRN

## 2016-06-24 NOTE — Telephone Encounter (Signed)
Patient was seen last week. Make sure that he is taking PPI daily, he was not taking it regularly when I saw him last. He needs to complete I FOBT sent that we can determine next step for his iron deficiency anemia.  Low-dose Phenergan sent in to use as needed for nausea/vomiting. Caution sedation.

## 2016-06-24 NOTE — Telephone Encounter (Signed)
Spoke with Dustin Roberts- she said pt IS taking PPI regularly. Informed her ifobt needs to be done, she asked that I mail it to him because they wont get back to [place] till next week. Informed of rx sent in and advised he should be cautious while taking it because it can make him sleepy. She stated she understood.

## 2016-06-24 NOTE — Telephone Encounter (Signed)
Darl PikesSusan, pt went to see his hematologist yesterday and they are requesting a copy of the pts last CT and capsule study faxed to them for their records. Pt goes to [facility] in [place].

## 2016-06-25 NOTE — Telephone Encounter (Signed)
done

## 2016-07-01 ENCOUNTER — Ambulatory Visit (INDEPENDENT_AMBULATORY_CARE_PROVIDER_SITE_OTHER): Payer: Medicaid Other | Admitting: Gastroenterology

## 2016-07-01 DIAGNOSIS — D509 Iron deficiency anemia, unspecified: Secondary | ICD-10-CM | POA: Diagnosis not present

## 2016-07-02 ENCOUNTER — Encounter: Payer: Self-pay | Admitting: Internal Medicine

## 2016-07-02 NOTE — Progress Notes (Signed)
Some decline in H/H, iron low. No celiac. LFTs normal.   Awaiting ifobt. Can we get hematology records, last OV and last labs. [facility] in [place], fax [phone]  Still no explanation from gi standpoint for anemia and abd pain/nausea. Needs ov with rmr only, will continue evaluation while awaiting appt. Let pt know I will discuss all findings with rmr when he returns next week.

## 2016-07-06 LAB — IFOBT (OCCULT BLOOD): IFOBT: POSITIVE

## 2016-07-06 NOTE — Progress Notes (Signed)
Patient scheduled with rmr 08/11/16

## 2016-07-07 NOTE — Progress Notes (Signed)
Please let patient know his ifobt was positive.  If hematology has not checked his cbc, ferritin since early 06/2016, we will need to check it.  I had received records after requesting earlier this week but labs were from 04/2016.   PER RMR, HE WANTS TO SEE HIM IN THE OFFICE BEFORE DECIDING THE NEXT STEP AND PT HAS OV ALREADY MADE.

## 2016-07-08 NOTE — Progress Notes (Signed)
Reviewed labs from hematology dated 06/24/16  Ferritin 7.6 WBC 7.14, H/H 11.9/37.4, MCV 78.7, Plat 191  Hgb stable to improved but remains with ida.   Keep ov with rmr 08/2016 as planned.

## 2016-07-09 NOTE — Progress Notes (Signed)
Patient is scheduled to see RMR on July 17 at 4:30 pm.

## 2016-07-09 NOTE — Progress Notes (Signed)
Patient's wife made aware of appt.  

## 2016-07-16 ENCOUNTER — Ambulatory Visit: Payer: Medicaid Other | Admitting: Gastroenterology

## 2016-07-17 NOTE — Progress Notes (Signed)
Received labs from hematologist dated 06/24/2016. Ferritin 7.6. Hemoglobin 11.9, hematocrit 37.4, MCV 78.7. Keep upcoming ov with rmr.

## 2016-07-20 NOTE — Progress Notes (Signed)
Tried to call pt, LMOVM for his wife and informed. He has OV with RMR tomorrow.

## 2016-07-21 ENCOUNTER — Ambulatory Visit (INDEPENDENT_AMBULATORY_CARE_PROVIDER_SITE_OTHER): Payer: Medicaid Other | Admitting: Internal Medicine

## 2016-07-21 ENCOUNTER — Encounter: Payer: Self-pay | Admitting: Internal Medicine

## 2016-07-21 VITALS — BP 123/71 | HR 75 | Temp 98.0°F | Ht 71.5 in | Wt 209.0 lb

## 2016-07-21 DIAGNOSIS — R197 Diarrhea, unspecified: Secondary | ICD-10-CM | POA: Diagnosis not present

## 2016-07-21 DIAGNOSIS — K5900 Constipation, unspecified: Secondary | ICD-10-CM

## 2016-07-21 DIAGNOSIS — R634 Abnormal weight loss: Secondary | ICD-10-CM | POA: Diagnosis not present

## 2016-07-21 DIAGNOSIS — F5000 Anorexia nervosa, unspecified: Secondary | ICD-10-CM

## 2016-07-21 DIAGNOSIS — D500 Iron deficiency anemia secondary to blood loss (chronic): Secondary | ICD-10-CM

## 2016-07-21 DIAGNOSIS — R195 Other fecal abnormalities: Secondary | ICD-10-CM | POA: Diagnosis not present

## 2016-07-21 NOTE — Progress Notes (Signed)
Primary Care Physician:  Altamease OilerHarris, Meredith L, FNP Primary Gastroenterologist:  Dr. Jena Gaussourk  Pre-Procedure History & Physical: HPI:  Dustin CoriaJohn H Roberts is a 61 y.o. male here for follow-up of iron deficiency anemia, anorexia and weight loss. He has lost another 7 pounds since he was last seen. He has known cholelithiasis but no evidence of cholecystitis. However, recent EGD, colonoscopy capsule study and CT failed to demonstrate any significant pathology to explain iron deficiency anemia. Previously took Advil/Aleve. He stopped that our request. However, he does continue take Excedrin for various aches and pains. His stools are intermittently dark from time to time no hematochezia. He does note anorexia. He does not have any postprandial abdominal pain (or other abdominal pain for that matter). 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  Has not had any similar symptoms in the past. Reflux symptoms well controlled on Protonix 40 mg daily.  Past Medical History:  Diagnosis Date  . Arthritis   . CHF (congestive heart failure) (HCC)   . Diabetes mellitus   . GERD (gastroesophageal reflux disease)   . 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 History of kidney stones   . HOH (hard of hearing)   . Hypertension   . IDA (iron deficiency anemia)   . Myocardial infarction (HCC)    2008  . Seizures (HCC)    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 knife surgery 2008  . Sleep apnea   . Stroke [facility](HCC)    Due to brain aneurysm    Past Surgical History:  Procedure Laterality Date  . APPENDECTOMY    . BIOPSY  05/27/2016   Procedure: BIOPSY;  Surgeon: Corbin Adeourk, Robert M, MD;  [facility];  Service: Endoscopy;;  Duodenal, esophagus  . BRAIN SURGERY     aneurysm, age 61  . CEREBRAL ANEURYSM REPAIR     Second procedure was gamma knife repair  . COLONOSCOPY WITH PROPOFOL N/A 05/27/2016   Procedure: COLONOSCOPY WITH PROPOFOL;  Surgeon: Corbin Adeourk, Robert  M, MD;  Location: [facility];  Service: Endoscopy;  Laterality: N/A;  1230   . ESOPHAGOGASTRODUODENOSCOPY (EGD) WITH PROPOFOL N/A 05/27/2016   Procedure: ESOPHAGOGASTRODUODENOSCOPY (EGD) WITH PROPOFOL;  Surgeon: Corbin Adeourk, Robert M, MD;  [facility];  Service: Endoscopy;  Laterality: N/A;  . FRACTURE SURGERY     left wrist  . GIVENS CAPSULE STUDY N/A 06/08/2016   Procedure: GIVENS CAPSULE STUDY;  Surgeon: Corbin Adeourk, Robert M, MD;  [facility];  Service: Endoscopy;  Laterality: N/A;  7:30am. Patient to arrive at 7:00am  . HERNIA REPAIR     left inguinal and umbilical  . KNEE SURGERY Left    twice  . POLYPECTOMY  05/27/2016   Procedure: POLYPECTOMY;  Surgeon: Corbin Adeourk, Robert M, MD;  [facility];  Service: Endoscopy;;  recto-sigmoid polyp  . ROTATOR CUFF REPAIR Left    x2    Prior to Admission medications   Medication Sig Start Date End Date Taking? Authorizing Provider  aspirin-acetaminophen-caffeine (EXCEDRIN MIGRAINE) [id number] MG tablet Take 1 tablet by mouth every 6 (six) hours as needed for headache.   Yes [provider]  insulin aspart (NOVOLOG) 100 UNIT/ML injection Inject 10 Units into the skin 4 (four) times daily -  before meals and at bedtime.   Yes [provider]  LANTUS 100 UNIT/ML injection Inject 50 Units into the skin daily. 03/20/16  Yes [provider]  lisinopril (PRINIVIL,ZESTRIL) 40 MG tablet Take 40 mg by mouth every evening.  12/17/14  Yes [provider]  Melatonin 10 MG TABS Take 10 mg by mouth at bedtime.   Yes [provider]  metFORMIN (GLUCOPHAGE) 1000 MG tablet Take 1,000 mg by mouth 2 (two) times daily. 01/01/15  Yes [provider]  pantoprazole (PROTONIX) 40 MG tablet Take 40 mg by mouth every evening.  01/01/15  Yes [provider]  promethazine (PHENERGAN) 12.5 MG tablet Take 1 tablet (12.5 mg total) by mouth every 6 (six) hours as needed for nausea or vomiting.  06/24/16  Yes Tiffany Kocher, PA-C  simvastatin (ZOCOR) 40 MG tablet Take 40 mg by mouth every evening.  01/01/15  Yes [provider]  insulin aspart (NOVOLOG FLEXPEN) 100 UNIT/ML FlexPen Inject 10 Units into the skin 3 (three) times daily before meals.    [provider]  loratadine (ALLERGY RELIEF) 10 MG tablet Take 10 mg by mouth every evening.     [provider]  naproxen sodium (ANAPROX) 220 MG tablet Take 220-440 mg by mouth 2 (two) times daily as needed (for pain).    [provider]  ondansetron (ZOFRAN) 4 MG tablet Take 1 tablet (4 mg total) by mouth every 8 (eight) hours as needed for nausea or vomiting. Patient not taking: Reported on 07/21/2016 06/11/16   Gelene Mink, NP    Allergies as of 07/21/2016 - Review Complete 07/21/2016  Allergen Reaction Noted  . Morphine and related  05/25/2016    Family History  Problem Relation Age of Onset  . Colon cancer Neg Hx     Social History   Social History  . Marital status: Legally Separated    Spouse name: N/A  . Number of children: N/A  . Years of education: N/A   Occupational History  . Not on file.   Social History Main Topics  . Smoking status: Never Smoker  . Smokeless tobacco: Never Used  . Alcohol use No  . Drug use: No  . Sexual activity: Yes    Birth control/ protection: None   Other Topics Concern  . Not on file   Social History Narrative  . No narrative on file    Review of Systems: See HPI, otherwise negative ROS  Physical Exam: BP 123/71   Pulse 75   Temp 98 F (36.7 C) (Oral)   Ht 5' 11.5" (1.816 m)   Wt 209 lb (94.8 kg)   BMI 28.74 kg/m  General:   Alert,   pleasant and cooperative in NAD. Accompanied by wife and granddaughters. Neck:  Supple; no masses or thyromegaly. No significant cervical adenopathy. Lungs:  Clear throughout to auscultation.   No wheezes, crackles, or rhonchi. No acute distress. Heart:  Regular rate and rhythm; no murmurs, clicks,  rubs,  or gallops. Abdomen: Non-distended, normal bowel sounds.  Soft and nontender without appreciable mass or hepatosplenomegaly.  Pulses:  Normal pulses noted. Extremities:  Without clubbing or edema.  Impression:  Pleasant 61 year old gentleman with refractory iron deficiency anemia and at least one occult blood positive stool documented. He also has anorexia, notable weight loss.  It is unclear of all his symptoms can be unified under one diagnosis. He has been requiring parenteral iron supplementation.  Currently appears he has occult GI bleeding of obscure etiology. Recent EGD colonoscopy, capsule and CTE study reviewed in detail.  Capsule study may have revealed subtle telltale signs of bleeding but certainly nothing at  all conclusive. Patient may still have NSAID insult to his GI tract because of ongoing NSAID (Excedrin) use.  His symptoms of anorexia and weight loss or beyond explanation by objective findings so thus far. 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 He has cholelithiasis but clinically, does not have any gallbladder symptoms.  Likewise, he has no typical symptoms to go with ischemia.   Recommendations: CBC, sed rate, Comprehensive metabolic profile  and urinalysis  Stop EXCEDRIN and all NSAIDS - like Advil, Aleve  Further recommendations to follow   I explained to the patient and his wife that if we cannot get to the bottom of this he may need tertiary referral.         Notice: This dictation was prepared with Dragon dictation along with smaller phrase technology. Any transcriptional errors that result from this process are unintentional and may not be corrected upon review.

## 2016-07-21 NOTE — Patient Instructions (Signed)
CBC, sed rate, Comprehensive metabolic profile  and urinalysis  Stop EXCEDRIN and all NSAIDS - like Advil, Aleve  Further recommendations to follow

## 2016-07-22 ENCOUNTER — Telehealth: Payer: Self-pay

## 2016-07-22 DIAGNOSIS — R739 Hyperglycemia, unspecified: Secondary | ICD-10-CM

## 2016-07-22 DIAGNOSIS — R112 Nausea with vomiting, unspecified: Secondary | ICD-10-CM

## 2016-07-22 LAB — COMPREHENSIVE METABOLIC PANEL
ALBUMIN: 4 g/dL (ref 3.6–5.1)
ALK PHOS: 74 U/L (ref 40–115)
ALT: 29 U/L (ref 9–46)
AST: 24 U/L (ref 10–35)
BILIRUBIN TOTAL: 0.4 mg/dL (ref 0.2–1.2)
BUN: 15 mg/dL (ref 7–25)
CO2: 23 mmol/L (ref 20–31)
Calcium: 9.2 mg/dL (ref 8.6–10.3)
Chloride: 104 mmol/L (ref 98–110)
Creat: 0.88 mg/dL (ref 0.70–1.25)
Glucose, Bld: 195 mg/dL — ABNORMAL HIGH (ref 65–99)
Potassium: 3.8 mmol/L (ref 3.5–5.3)
Sodium: 138 mmol/L (ref 135–146)
Total Protein: 6.5 g/dL (ref 6.1–8.1)

## 2016-07-22 LAB — CBC WITH DIFFERENTIAL/PLATELET
BASOS PCT: 1 %
Basophils Absolute: 61 cells/uL (ref 0–200)
EOS ABS: 122 {cells}/uL (ref 15–500)
Eosinophils Relative: 2 %
HEMATOCRIT: 37.7 % — AB (ref 38.5–50.0)
Hemoglobin: 12.1 g/dL — ABNORMAL LOW (ref 13.2–17.1)
LYMPHS ABS: 1891 {cells}/uL (ref 850–3900)
LYMPHS PCT: 31 %
MCH: 26.4 pg — ABNORMAL LOW (ref 27.0–33.0)
MCHC: 32.1 g/dL (ref 32.0–36.0)
MCV: 82.1 fL (ref 80.0–100.0)
MONO ABS: 305 {cells}/uL (ref 200–950)
MPV: 10.7 fL (ref 7.5–12.5)
Monocytes Relative: 5 %
NEUTROS ABS: 3721 {cells}/uL (ref 1500–7800)
Neutrophils Relative %: 61 %
Platelets: 166 10*3/uL (ref 140–400)
RBC: 4.59 MIL/uL (ref 4.20–5.80)
RDW: 18.4 % — AB (ref 11.0–15.0)
WBC: 6.1 10*3/uL (ref 3.8–10.8)

## 2016-07-22 LAB — URINALYSIS
Bilirubin Urine: NEGATIVE
GLUCOSE, UA: NEGATIVE
HGB URINE DIPSTICK: NEGATIVE
Leukocytes, UA: NEGATIVE
Nitrite: NEGATIVE
PH: 5.5 (ref 5.0–8.0)
Specific Gravity, Urine: 1.037 — ABNORMAL HIGH (ref 1.001–1.035)

## 2016-07-22 MED ORDER — HYDROCORTISONE 2.5 % RE CREA: 1.0000 "application " | TOPICAL_CREAM | Freq: Two times a day (BID) | RECTAL | 0 refills | Status: DC

## 2016-07-22 MED ORDER — ONDANSETRON HCL 4 MG PO TABS: 4.0000 mg | ORAL_TABLET | ORAL | 1 refills | Status: DC | PRN

## 2016-07-22 NOTE — Telephone Encounter (Signed)
Spoke to wife. Patient gone to get blood work per RMR request from yesterday's office visit.   Patient had "smear" of blood on underwear, bright red, "a line". Not much. Likely due to internal hemorrhoids. She also requested prescription for Zofran to be sent in because he is out.   She states that she gives him Zofran 30 minutes before each meal to prevent vomiting. If she doesn't he will vomit within minutes of eating. Occurs daily. Zofran seems to slow down the process but still having some daily vomiting. He is a diabetic. May consider gastric emptying study. To discuss further with Dr. Jena Gaussourk he saw patient yesterday in the office.   RX sent in for Zofran and hemorrhoid cream. Advised patient's wife if significant bleeding noted, to go to ER.

## 2016-07-22 NOTE — Telephone Encounter (Signed)
Patients wife called and said that he came home from work with fresh blood in his pant. She is wanting to know what they need to do. I told her to go to the ER but she wanted me to do a note first to see what was best. Please advise

## 2016-07-23 LAB — SEDIMENTATION RATE: Sed Rate: 4 mm/hr (ref 0–20)

## 2016-07-24 NOTE — Telephone Encounter (Signed)
He is a challenge. No recent hemoglobin A1c that I see in chart. Recent blood sugar almost 200.  With his ongoing symptoms of nausea/vomiting, I think it would be a good idea to give him get a solid phase GBS.

## 2016-07-24 NOTE — Addendum Note (Signed)
Addended by: Jennings BooksMCCOLLUM, Story Conti M on: 07/24/2016 10:33 AM   Modules accepted: Orders

## 2016-07-24 NOTE — Telephone Encounter (Signed)
Dr. Jena Gaussourk, would you advise GES?

## 2016-07-24 NOTE — Telephone Encounter (Signed)
Patient is scheduled for GES 7/24 at 8:45 am.  Patient's wife is aware of appt and NPO after MN and no stomach meds 8 hours prior to his test.

## 2016-07-24 NOTE — Addendum Note (Signed)
Addended by: Jennings BooksMCCOLLUM, Kiandria Clum M on: 07/24/2016 10:51 AM   Modules accepted: Orders

## 2016-07-28 ENCOUNTER — Ambulatory Visit (HOSPITAL_COMMUNITY)
Admission: RE | Admit: 2016-07-28 | Discharge: 2016-07-28 | Disposition: A | Discharge status: Home / Self Care | Payer: Medicaid Other | Source: Ambulatory Visit | Attending: Internal Medicine | Admitting: Internal Medicine

## 2016-07-28 ENCOUNTER — Encounter (HOSPITAL_COMMUNITY): Payer: Self-pay

## 2016-07-28 DIAGNOSIS — R112 Nausea with vomiting, unspecified: Secondary | ICD-10-CM | POA: Diagnosis not present

## 2016-07-28 MED ORDER — TECHNETIUM TC 99M SULFUR COLLOID
2.0000 | Freq: Once | INTRAVENOUS | Status: AC | PRN
  Administered 2016-07-28: 2 via ORAL

## 2016-08-04 ENCOUNTER — Other Ambulatory Visit: Payer: Self-pay

## 2016-08-04 DIAGNOSIS — R112 Nausea with vomiting, unspecified: Secondary | ICD-10-CM

## 2016-08-11 ENCOUNTER — Ambulatory Visit: Payer: Medicaid Other | Admitting: Internal Medicine

## 2016-11-06 DIAGNOSIS — Z8639 Personal history of other endocrine, nutritional and metabolic disease: Secondary | ICD-10-CM | POA: Insufficient documentation

## 2016-12-14 ENCOUNTER — Telehealth: Payer: Self-pay | Admitting: *Deleted

## 2016-12-14 NOTE — Telephone Encounter (Signed)
Left message letting him know that our office will be closed due to inclement weather.  Provided our number to call back to reschedule.  Since he is a new patient, he can be scheduled with the first available, appropriate provider.

## 2016-12-15 ENCOUNTER — Ambulatory Visit: Payer: Medicaid Other | Admitting: Neurology

## 2017-07-22 ENCOUNTER — Other Ambulatory Visit: Payer: Self-pay | Admitting: Gastroenterology

## 2018-09-22 IMAGING — CT CT ENTEROGRAPHY (ABD-PELV W/ CM)
2 of 6 series · 16 of 46 positions shown, 18 images · IV contrast (iopamidol)
Comparison: No priors.

CLINICAL DATA: 61-year-old male with generalized abdominal pain.

EXAM:
CT ABDOMEN AND PELVIS WITH CONTRAST (ENTEROGRAPHY)
TECHNIQUE: Multidetector CT of the abdomen and pelvis during bolus
administration of intravenous contrast. Negative oral contrast was
given.
CONTRAST:  125mL Z4HG0Z-D00 IOPAMIDOL (Z4HG0Z-D00) INJECTION 61%

[Series 3: entero thins · axial · 0.87mm/px · z∈[-468,-22]mm · 13 of 251 slices shown, 15 images]
[im 14/251  soft-tissue]
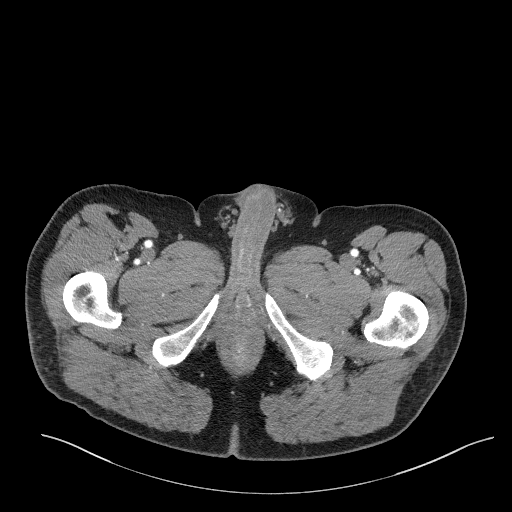
[im 14/251  bone]
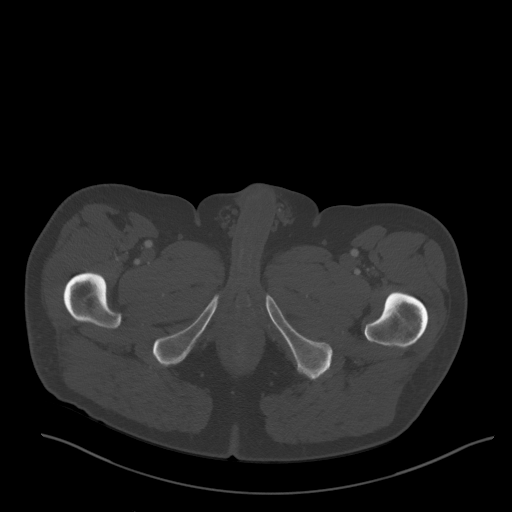
[im 28/251  soft-tissue]
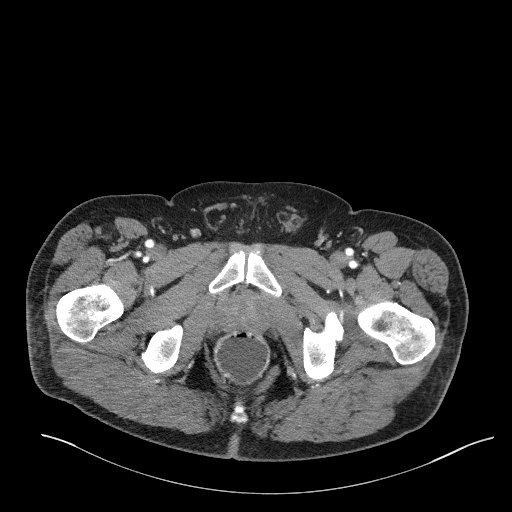
[im 56/251  soft-tissue]
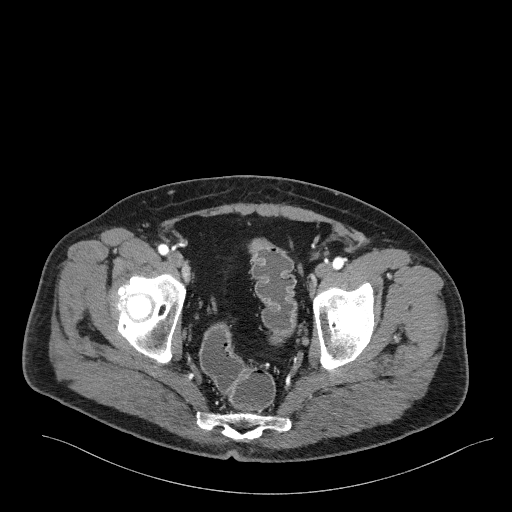
[im 70/251  soft-tissue]
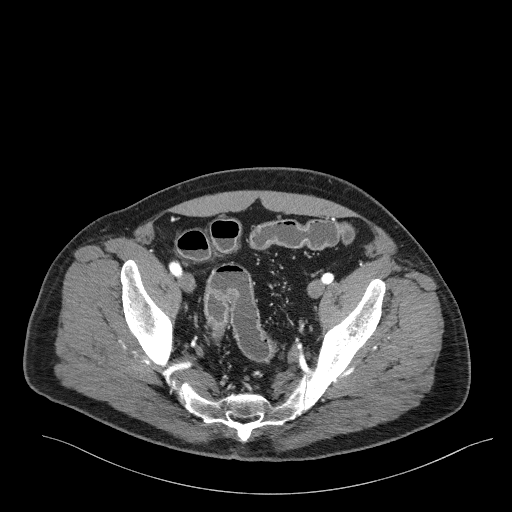
[im 84/251  soft-tissue]
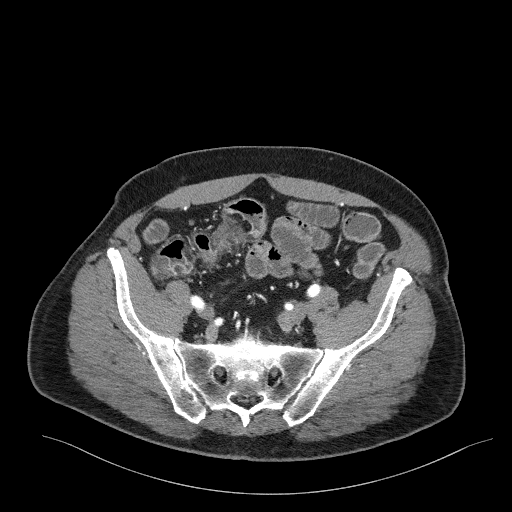
[im 112/251  soft-tissue]
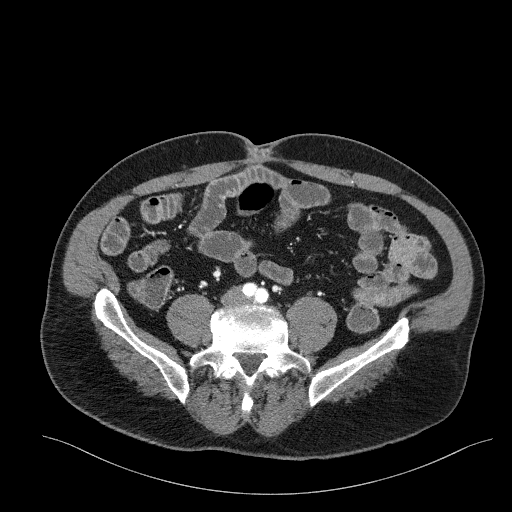
[im 126/251  soft-tissue]
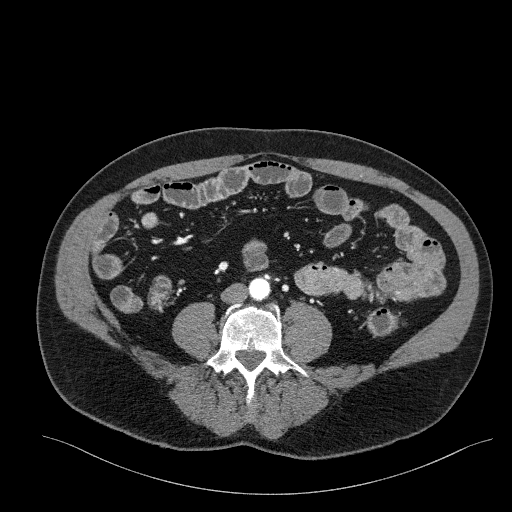
[im 139/251  soft-tissue]
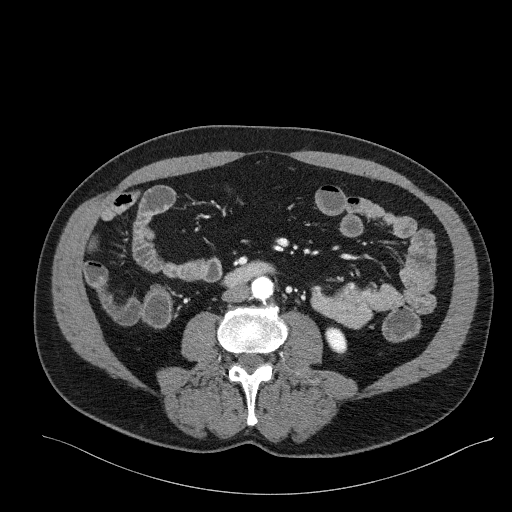
[im 167/251  soft-tissue]
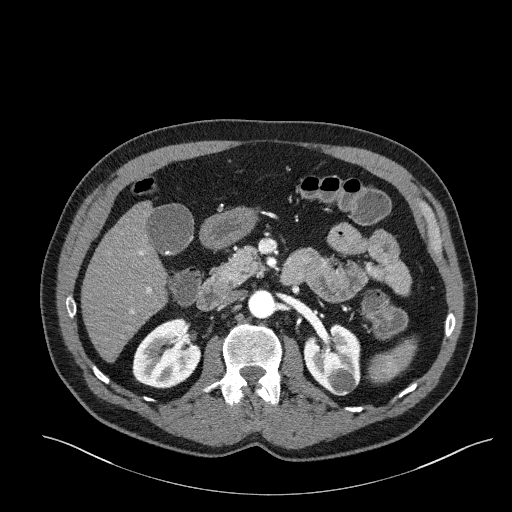
[im 167/251  bone]
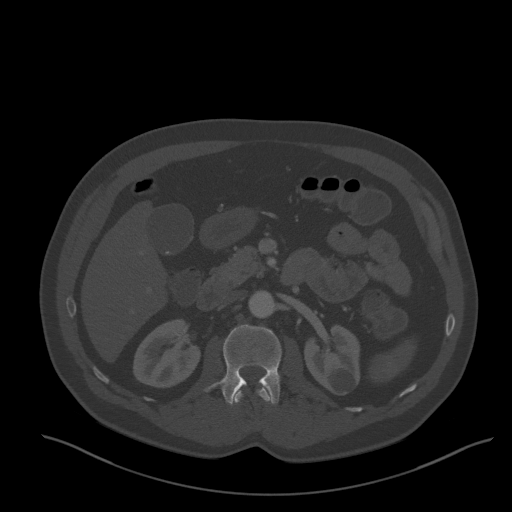
[im 181/251  soft-tissue]
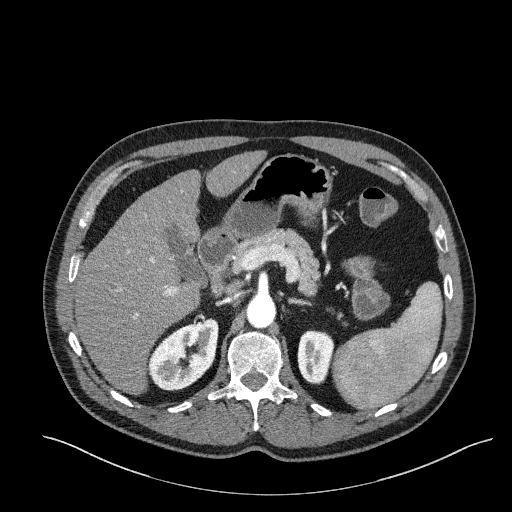
[im 195/251  soft-tissue]
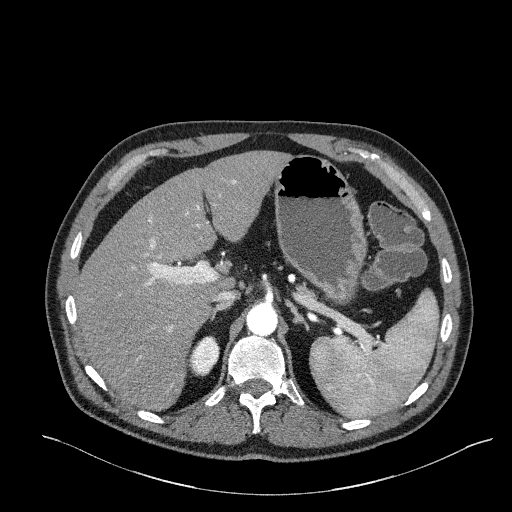
[im 223/251  soft-tissue]
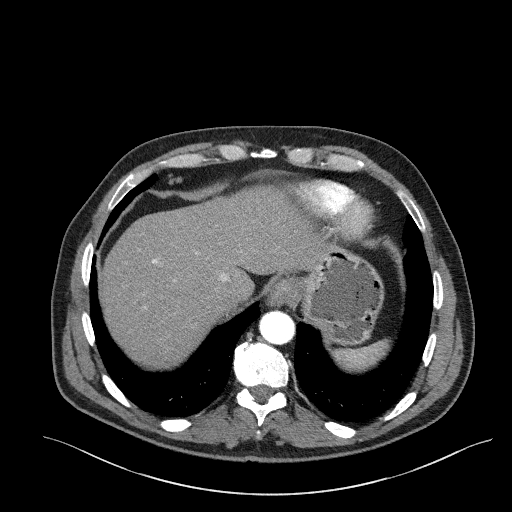
[im 237/251  soft-tissue]
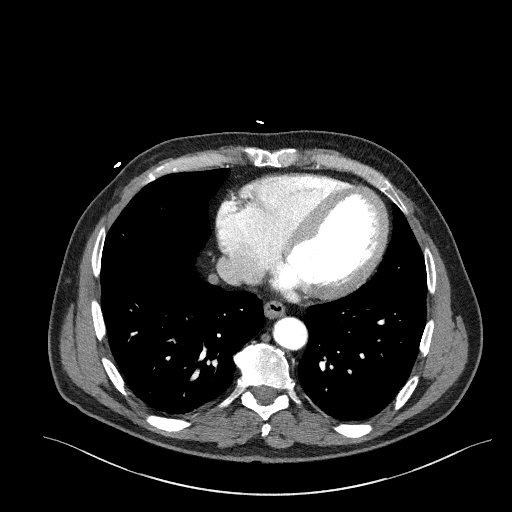

[Series 5: coronal · coronal · 0.88mm/px · 3 of 100 slices shown]
[im 34/100  soft-tissue]
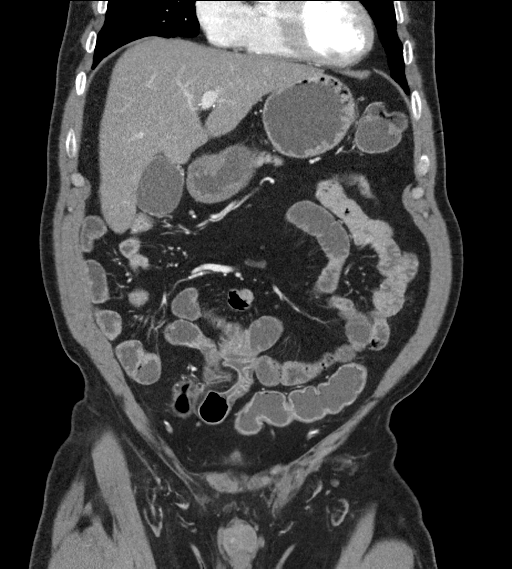
[im 45/100  soft-tissue]
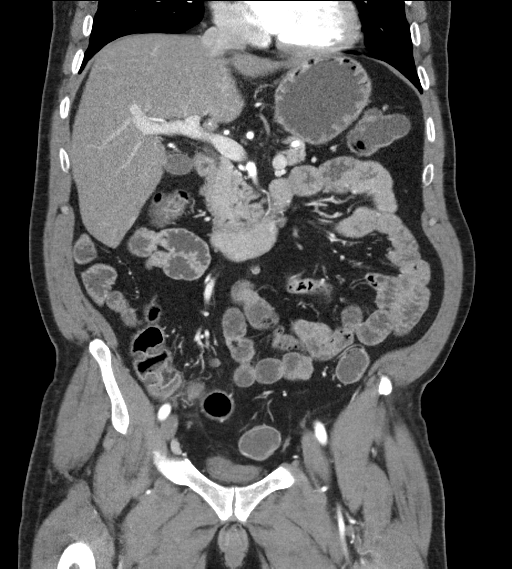
[im 56/100  soft-tissue]
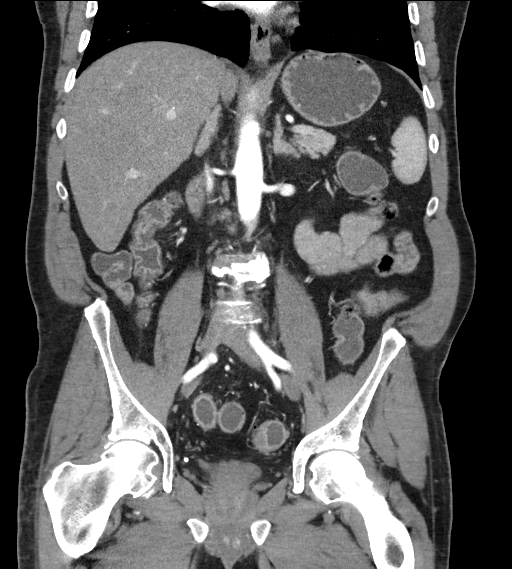

[16 of 46 positions shown; findings below may reference images not displayed]

FINDINGS: Lower chest:  Unremarkable.

Hepatobiliary: No cystic or solid hepatic lesions. No intra or
extrahepatic biliary ductal dilatation. Tiny calcified gallstone
lying dependently in the gallbladder. No findings to suggest an
acute cholecystitis at this time.

Pancreas: No pancreatic mass. No pancreatic ductal dilatation. No
pancreatic or peripancreatic fluid or inflammatory changes.

Spleen: Unremarkable.

Adrenals/Urinary Tract: 2.3 cm simple cyst in the interpolar region
of the left kidney. Right kidney and bilateral adrenal glands are
normal in appearance. No hydroureteronephrosis. Urinary bladder is
nearly decompressed, but otherwise unremarkable in appearance.

Stomach/Bowel: Normal appearance of the stomach. No pathologic
dilatation of small bowel or colon. Enterography images demonstrate
no definite focal areas of mural thickening or surrounding
inflammatory changes associated with either the small bowel or
colon. Terminal ileum is grossly normal in appearance. The appendix
is not confidently identified and may be surgically absent.
Regardless, there are no inflammatory changes noted adjacent to the
cecum to suggest the presence of an acute appendicitis at this time.

Vascular/Lymphatic: Aortic atherosclerosis (mild), without evidence
of aneurysm or dissection in the abdominal or pelvic vasculature. No
lymphadenopathy noted in the abdomen or pelvis.

Reproductive: Prostate gland and seminal vesicles are unremarkable
in appearance.

Other: No significant volume of ascites.  No pneumoperitoneum.

Musculoskeletal: Well-defined 13 mm sclerotic lesion in the right
side of the L5 vertebral body demonstrates a narrow zone of
transition, likely to reflect a small bone island. No other
aggressive appearing lytic or blastic lesions are noted elsewhere in
the visualized portions of the skeleton.
IMPRESSION: 1. No acute findings are noted in the abdomen or pelvis to account
for the patient's symptoms.
2. Aortic atherosclerosis (mild).
3. Cholelithiasis without evidence of acute cholecystitis at this
time.
4. Additional incidental findings, as above.

## 2018-10-30 ENCOUNTER — Other Ambulatory Visit: Payer: Self-pay

## 2018-10-30 ENCOUNTER — Emergency Department (HOSPITAL_COMMUNITY)
Admission: EM | Admit: 2018-10-30 | Discharge: 2018-10-30 | Disposition: A | Discharge status: Home / Self Care | Payer: Medicare Other | Attending: Emergency Medicine | Admitting: Emergency Medicine

## 2018-10-30 ENCOUNTER — Encounter (HOSPITAL_COMMUNITY): Payer: Self-pay | Admitting: Emergency Medicine

## 2018-10-30 DIAGNOSIS — Z79899 Other long term (current) drug therapy: Secondary | ICD-10-CM | POA: Diagnosis not present

## 2018-10-30 DIAGNOSIS — E119 Type 2 diabetes mellitus without complications: Secondary | ICD-10-CM | POA: Insufficient documentation

## 2018-10-30 DIAGNOSIS — Y929 Unspecified place or not applicable: Secondary | ICD-10-CM | POA: Insufficient documentation

## 2018-10-30 DIAGNOSIS — T1592XA Foreign body on external eye, part unspecified, left eye, initial encounter: Secondary | ICD-10-CM | POA: Diagnosis not present

## 2018-10-30 DIAGNOSIS — Y33XXXA Other specified events, undetermined intent, initial encounter: Secondary | ICD-10-CM | POA: Diagnosis not present

## 2018-10-30 DIAGNOSIS — Z8673 Personal history of transient ischemic attack (TIA), and cerebral infarction without residual deficits: Secondary | ICD-10-CM | POA: Insufficient documentation

## 2018-10-30 DIAGNOSIS — Z794 Long term (current) use of insulin: Secondary | ICD-10-CM | POA: Insufficient documentation

## 2018-10-30 DIAGNOSIS — Y939 Activity, unspecified: Secondary | ICD-10-CM | POA: Insufficient documentation

## 2018-10-30 DIAGNOSIS — Y999 Unspecified external cause status: Secondary | ICD-10-CM | POA: Insufficient documentation

## 2018-10-30 DIAGNOSIS — I509 Heart failure, unspecified: Secondary | ICD-10-CM | POA: Diagnosis not present

## 2018-10-30 DIAGNOSIS — Z7982 Long term (current) use of aspirin: Secondary | ICD-10-CM | POA: Insufficient documentation

## 2018-10-30 DIAGNOSIS — S0592XA Unspecified injury of left eye and orbit, initial encounter: Secondary | ICD-10-CM | POA: Diagnosis present

## 2018-10-30 DIAGNOSIS — I110 Hypertensive heart disease with heart failure: Secondary | ICD-10-CM | POA: Insufficient documentation

## 2018-10-30 MED ORDER — TETRACAINE HCL 0.5 % OP SOLN
1.0000 [drp] | Freq: Once | OPHTHALMIC | Status: AC
  Administered 2018-10-30: 1 [drp] via OPHTHALMIC
  Filled 2018-10-30: qty 4

## 2018-10-30 MED ORDER — FLUORESCEIN SODIUM 1 MG OP STRP
1.0000 | ORAL_STRIP | Freq: Once | OPHTHALMIC | Status: AC
  Administered 2018-10-30: 18:00:00 1 via OPHTHALMIC
  Filled 2018-10-30: qty 1

## 2018-10-30 MED ORDER — TOBRAMYCIN 0.3 % OP SOLN
1.0000 [drp] | OPHTHALMIC | Status: DC
  Administered 2018-10-30: 1 [drp] via OPHTHALMIC
  Filled 2018-10-30: qty 5

## 2018-10-30 NOTE — ED Provider Notes (Signed)
[facility] EMERGENCY DEPARTMENT Provider Note   CSN: [id number] Arrival date & time: 10/30/18  1732     History   Chief Complaint Chief Complaint  Patient presents with  . Eye Problem    HPI Dustin Roberts is a 63 y.o. male with DM, chf, GERD, MI, wears reading glasses only, presenting with pain, redness and increased tearing from his left eye.  He denies injury to the eye or foreign body sensation.  He was driving his vehicle (windows up) 3 days ago when the symptoms began.  He denies vision changes, but endorses photophobia.  Increased clear tearing, no purulent discharge.  He had a similar episode years ago, diagnosed with viral conjunctivitis. No known exposures to this infection.  Current with tetanus.     The history is provided by the patient.    Past Medical History:  Diagnosis Date  . Arthritis   . CHF (congestive heart failure) ([place])   . Diabetes mellitus   . GERD (gastroesophageal reflux disease)   . 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 History of kidney stones   . HOH (hard of hearing)   . Hypertension   . IDA (iron deficiency anemia)   . Myocardial infarction ([place])    2008  . Seizures ([place])    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 knife surgery 2008  . Sleep apnea   . Stroke [facility])    Due to brain aneurysm    Patient Active Problem List   Diagnosis Date Noted  . Nausea with vomiting 06/16/2016  . IDA (iron deficiency anemia) 05/25/2016  . GERD (gastroesophageal reflux disease) 05/25/2016  . Left sided abdominal pain 05/25/2016  . Diarrhea 05/25/2016    Past Surgical History:  Procedure Laterality Date  . APPENDECTOMY    . BIOPSY  05/27/2016   Procedure: BIOPSY;  Surgeon: Daneil Dolin, MD;  [facility];  Service: Endoscopy;;  Duodenal, esophagus  . BRAIN SURGERY     aneurysm, age 63  . CEREBRAL ANEURYSM REPAIR     Second procedure was gamma knife repair  . COLONOSCOPY WITH PROPOFOL N/A 05/27/2016   Procedure: COLONOSCOPY WITH PROPOFOL;  Surgeon: Daneil Dolin, MD;  [facility];  Service: Endoscopy;  Laterality: N/A;  1230   . ESOPHAGOGASTRODUODENOSCOPY (EGD) WITH PROPOFOL N/A 05/27/2016   Procedure: ESOPHAGOGASTRODUODENOSCOPY (EGD) WITH PROPOFOL;  Surgeon: Daneil Dolin, MD;  [facility];  Service: Endoscopy;  Laterality: N/A;  . FRACTURE SURGERY     left wrist  . GIVENS CAPSULE STUDY N/A 06/08/2016   Procedure: GIVENS CAPSULE STUDY;  Surgeon: Daneil Dolin, MD;  [facility];  Service: Endoscopy;  Laterality: N/A;  7:30am. Patient to arrive at 7:00am  . HERNIA REPAIR     left inguinal and umbilical  . KNEE SURGERY Left    twice  . POLYPECTOMY  05/27/2016   Procedure: POLYPECTOMY;  Surgeon: Daneil Dolin, MD;  [facility];  Service: Endoscopy;;  recto-sigmoid polyp  . ROTATOR CUFF REPAIR Left    x2        Home Medications    Prior to Admission medications   Medication Sig Start Date End Date Taking? Authorizing Provider  aspirin-acetaminophen-caffeine (EXCEDRIN MIGRAINE) [id number] MG tablet Take 1 tablet by mouth every 6 (six) hours as needed for headache.    [provider]  hydrocortisone (ANUSOL-HC) 2.5 % rectal cream Place 1 application rectally 2 (two)  times daily. For 10 days. 07/22/16   Tiffany KocherLewis, Leslie S, PA-C  insulin aspart (NOVOLOG FLEXPEN) 100 UNIT/ML FlexPen Inject 10 Units into the skin 3 (three) times daily before meals.    [provider]  insulin aspart (NOVOLOG) 100 UNIT/ML injection Inject 10 Units into the skin 4 (four) times daily -  before meals and at bedtime.    [provider]  LANTUS 100 UNIT/ML injection Inject 50 Units into the skin daily. 03/20/16   [provider]  lisinopril (PRINIVIL,ZESTRIL) 40 MG tablet Take 40 mg by mouth every evening.  12/17/14   [provider]  loratadine (ALLERGY RELIEF) 10 MG tablet Take 10 mg by mouth every evening.     [provider]  Melatonin 10 MG TABS Take 10 mg by mouth at bedtime.    [provider]  metFORMIN (GLUCOPHAGE) 1000 MG tablet Take 1,000 mg by mouth 2 (two) times daily. 01/01/15   [provider]  naproxen sodium (ANAPROX) 220 MG tablet Take 220-440 mg by mouth 2 (two) times daily as needed (for pain).    [provider]  ondansetron (ZOFRAN) 4 MG tablet TAKE 1 TABLET BY MOUTH EVERY 4 HOURS AS NEEDED FOR NAUSEA/ VOMITING 07/23/17   Gelene MinkBoone, Anna W, NP  pantoprazole (PROTONIX) 40 MG tablet Take 40 mg by mouth every evening.  01/01/15   [provider]  promethazine (PHENERGAN) 12.5 MG tablet Take 1 tablet (12.5 mg total) by mouth every 6 (six) hours as needed for nausea or vomiting. 06/24/16   Tiffany KocherLewis, Leslie S, PA-C  simvastatin (ZOCOR) 40 MG tablet Take 40 mg by mouth every evening.  01/01/15   [provider]    Family History Family History  Problem Relation Age of Onset  . Colon cancer Neg Hx     Social History Social History   Tobacco Use  . Smoking status: Never Smoker  . Smokeless tobacco: Never Used  Substance Use Topics  . Alcohol use: No  . Drug use: No     Allergies   Morphine and related   Review of Systems Review of Systems  Constitutional: Negative for chills and fever.  HENT: Negative for congestion.   Eyes: Positive for photophobia, pain and redness. Negative for discharge and visual disturbance.  Respiratory: Negative.   Cardiovascular: Negative.   Gastrointestinal: Negative for nausea.  Genitourinary: Negative.   Musculoskeletal: Negative for arthralgias, joint swelling and neck pain.  Skin: Negative.  Negative for rash and wound.  Neurological: Negative for dizziness, weakness, light-headedness, numbness and headaches.  Psychiatric/Behavioral: Negative.      Physical Exam Updated Vital Signs BP (!) 170/93 (BP Location: Right Arm)   Pulse (!) 52   Temp (!) 97 F (36.1 C) (Temporal)   Resp 18   Ht 6'  (1.829 m)   Wt 97.5 kg   SpO2 98%   BMI 29.16 kg/m   Physical Exam Vitals signs and nursing note reviewed.  Constitutional:      Appearance: He is well-developed.  HENT:     Head: Normocephalic and atraumatic.  Eyes:     General: Lids are normal.        Left eye: Foreign body present.No discharge.     Extraocular Movements: Extraocular movements intact.     Conjunctiva/sclera:     Left eye: Left conjunctiva is injected.     Slit lamp exam:    Left eye: Foreign body present.      Comments: Dark brown tiny foreign  body seen 11-12 oclock position at pupil edge.  It does not fluoresce.   Visual Acuity Bilateral Distance: 20/40 R Distance: 20/30 L Distance: 20/40    Neck:     Musculoskeletal: Normal range of motion.  Cardiovascular:     Rate and Rhythm: Normal rate and regular rhythm.     Heart sounds: Normal heart sounds.  Pulmonary:     Effort: Pulmonary effort is normal.     Breath sounds: Normal breath sounds. No wheezing.  Abdominal:     General: Bowel sounds are normal.     Palpations: Abdomen is soft.     Tenderness: There is no abdominal tenderness.  Musculoskeletal: Normal range of motion.  Skin:    General: Skin is warm and dry.  Neurological:     Mental Status: He is alert.      ED Treatments / Results  Labs (all labs ordered are listed, but only abnormal results are displayed) Labs Reviewed - No data to display  EKG None  Radiology No results found.  Procedures .Foreign Body Removal  Date/Time: 10/30/2018 7:27 PM Performed by: Burgess Amor, PA-C Authorized by: Burgess Amor, PA-C  Consent: Verbal consent obtained. Risks and benefits: risks, benefits and alternatives were discussed Consent given by: patient Patient understanding: patient states understanding of the procedure being performed Patient consent: the patient's understanding of the procedure matches consent given Patient identity confirmed: verbally with patient Time out:  Immediately prior to procedure a "time out" was called to verify the correct patient, procedure, equipment, support staff and site/side marked as required. Body area: eye  Anesthesia: Local Anesthetic: tetracaine drops  Sedation: Patient sedated: no  Patient cooperative: yes Removal mechanism: moist cotton swab Eye examined with fluorescein. No fluorescein uptake. No residual rust ring present. Dressing: antibiotic drops Depth: superficial Complexity: simple 3 objects recovered. Objects recovered: tiny pieces of suspected organic debris Post-procedure assessment: foreign body removed Patient tolerance: patient tolerated the procedure well with no immediate complications   (including critical care time)  Medications Ordered in ED Medications  tobramycin (TOBREX) 0.3 % ophthalmic solution 1 drop (has no administration in time range)  tetracaine (PONTOCAINE) 0.5 % ophthalmic solution 1 drop (1 drop Both Eyes Given 10/30/18 1819)  fluorescein ophthalmic strip 1 strip (1 strip Both Eyes Given 10/30/18 1820)     Initial Impression / Assessment and Plan / ED Course  I have reviewed the triage vital signs and the nursing notes.  Pertinent labs & imaging results that were available during my care of the patient were reviewed by me and considered in my medical decision making (see chart for details).        tobrex qid x 7 days. Prn f/u with ophthalmology - referral given. Return precautions outlined.   Final Clinical Impressions(s) / ED Diagnoses   Final diagnoses:  Foreign body, eye, left, initial encounter    ED Discharge Orders    None       Victoriano Lain 10/30/18 Kristian Covey, MD 10/30/18 2020

## 2018-10-30 NOTE — ED Triage Notes (Signed)
Pt c/o LT eye pain and redness x 3 days. Denies injury or foreign body. Denies any discharge.

## 2018-10-30 NOTE — Discharge Instructions (Addendum)
Apply 1 drop of the antibiotic given every 4 hours (while awake), so getting 4 drops daily for the next 7 days as your cornea heals.  Get rechecked for any problems with increased pain,vision changes, worse redness or drainage from your eye.

## 2020-04-10 NOTE — Progress Notes (Signed)
[facility] [address], [place] [address]   CLINIC:  Medical Oncology/Hematology  CONSULT NOTE  Patient Care Team: Altamease Oiler, FNP as PCP - General (Family Medicine) Jena Gauss Gerrit Friends, MD as Consulting Physician (Gastroenterology) Pat Patrick, MD as Referring Physician (Hematology and Oncology)    CHIEF COMPLAINTS/PURPOSE OF CONSULTATION:  Iron deficiency anemia  HISTORY OF PRESENTING ILLNESS:  Dustin Roberts 65 y.o. male is here at the request of his primary care provider (Coral Ceo NP, of [facility]).  He was referred due to his history of iron deficiency anemia, requiring iron transfusions in the past.  Labs sent from PCP were reviewed and notable for hemoglobin 16.4, with normal WBC and platelets.  Serum iron 25, TIBC 356, iron saturation 7%.  (Ferritin was not checked)  History of GI bleeding in 2018 with ongoing melena for 6 months, but no source was identified.  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  Capsule study in June 2018 showed small specks of blood, but did not identify source of bleeding.  At the time, the patient was taking frequent NSAIDS, but denies current use.  He did not require blood transfusions or hospitalization.   He denies recent chest pain on exertion, pre-syncopal episodes, or palpitations.  His wife notes that he does have some dyspnea on exertion.  He has had increased fatigue for the past 2-3 months. He has daytime hypersomnolence due to OSA without use of CPAP. He had not noticed any recent bleeding such as epistaxis, hematuria, hematochezia, or melena. He is not on antiplatelets agents or other blood thinners. He was previously taking oral iron, but has had financial difficulty getting more. No pica or abnormal cravings.  He  likely has a degree of iron malabsorption secondary to PPI use for GERD.  Reports appetite at 100%, energy levels at 25%.  He has unintentionally lost about 20 pounds over the past 6 months. He had no prior history or diagnosis of cancer. His age appropriate screening programs are up-to-date.  PMH is otherwise notable for type 2 diabetes mellitus (A1C 12.8), hypertension, COPD, OSA without use of CPAP, history of cerebral aneurysm rupture, and medical noncompliance.  Brother died from pancreatic cancer at age 63.  Sister had bone cancer, of unspecified type.  Patient is a lifelong non-smoker.  He denies alcohol and illicit drug use. He is retired from work as a Curator.   MEDICAL HISTORY:  Past Medical History:  Diagnosis Date  . Arthritis   . CHF (congestive heart failure) (HCC)   . Diabetes mellitus   . GERD (gastroesophageal reflux disease)   . 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 History of kidney stones   . HOH (hard of hearing)   . Hypertension   . IDA (iron deficiency anemia)   . Myocardial infarction (HCC)    2008  . Seizures (HCC)    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 knife surgery 2008  . Sleep apnea   . Stroke [facility])    Due to brain aneurysm    SURGICAL HISTORY: Past Surgical History:  Procedure Laterality Date  . APPENDECTOMY    . BIOPSY  05/27/2016   Procedure: BIOPSY;  Surgeon: Corbin Ade, MD;  [facility];  Service: Endoscopy;;  Duodenal, esophagus  .  BRAIN SURGERY     aneurysm, age 61  . CEREBRAL ANEURYSM REPAIR     Second procedure was gamma knife repair  . COLONOSCOPY WITH PROPOFOL N/A 05/27/2016   Procedure: COLONOSCOPY WITH PROPOFOL;  Surgeon: Corbin Ade, MD;  [facility];  Service: Endoscopy;  Laterality: N/A;  1230   . ESOPHAGOGASTRODUODENOSCOPY (EGD) WITH PROPOFOL N/A 05/27/2016   Procedure: ESOPHAGOGASTRODUODENOSCOPY (EGD) WITH PROPOFOL;  Surgeon: Corbin Ade, MD;  [facility];  Service: Endoscopy;  Laterality: N/A;  . FRACTURE SURGERY     left wrist  . GIVENS CAPSULE STUDY N/A 06/08/2016   Procedure: GIVENS CAPSULE STUDY;  Surgeon: Corbin Ade, MD;  [facility];  Service: Endoscopy;  Laterality: N/A;  7:30am. Patient to arrive at 7:00am  . HERNIA REPAIR     left inguinal and umbilical  . KNEE SURGERY Left    twice  . POLYPECTOMY  05/27/2016   Procedure: POLYPECTOMY;  Surgeon: Corbin Ade, MD;  [facility];  Service: Endoscopy;;  recto-sigmoid polyp  . ROTATOR CUFF REPAIR Left    x2    SOCIAL HISTORY: Social History   Socioeconomic History  . Marital status: Legally Separated    Spouse name: Not on file  . Number of children: Not on file  . Years of education: Not on file  . Highest education level: Not on file  Occupational History  . Not on file  Tobacco Use  . Smoking status: Never Smoker  . Smokeless tobacco: Never Used  Vaping Use  . Vaping Use: Never used  Substance and Sexual Activity  . Alcohol use: No  . Drug use: No  . Sexual activity: Yes    Birth control/protection: None  Other Topics Concern  . Not on file  Social History Narrative  . Not on file   Social Determinants of Health   Financial Resource Strain: Not on file  Food Insecurity: Not on file  Transportation Needs: Not on file  Physical Activity: Not on file  Stress: Not on file  Social Connections: Not on file  Intimate Partner Violence: Not on file    FAMILY HISTORY: Family History  Problem Relation Age of Onset  . Colon cancer Neg Hx     ALLERGIES:  is allergic to morphine and related.  MEDICATIONS:  Current Outpatient Medications  Medication Sig Dispense Refill  . aspirin-acetaminophen-caffeine (EXCEDRIN MIGRAINE) 250-250-65 MG tablet Take 1 tablet by mouth every 6 (six) hours as needed for headache.    . docusate sodium (COLACE CLEAR) 50 MG capsule Take 1 capsule (50 mg total) by mouth daily as needed for mild  constipation. As needed for constipation associated with iron supplement 30 capsule 0  . ferrous sulfate 325 (65 FE) MG EC tablet Take 1 tablet (325 mg total) by mouth daily with breakfast. 30 tablet 3  . gabapentin (NEURONTIN) 300 MG capsule Take 300 mg by mouth 2 (two) times daily.    . hydrochlorothiazide (MICROZIDE) 12.5 MG capsule Take 12.5 mg by mouth daily.    . hydrocortisone (ANUSOL-HC) 2.5 % rectal cream Place 1 application rectally 2 (two) times daily. For 10 days. 30 g 0  . insulin aspart (NOVOLOG) 100 UNIT/ML FlexPen Inject 10 Units into the skin 3 (three) times daily before meals.    Marland Kitchen LANTUS 100 UNIT/ML injection Inject 50 Units into the skin daily.  3  . lisinopril (PRINIVIL,ZESTRIL) 40 MG tablet Take 40 mg by mouth every evening.  3  . loratadine (CLARITIN) 10 MG tablet Take 10 mg by mouth every evening.     . metFORMIN (GLUCOPHAGE) 1000 MG tablet Take 1,000 mg by mouth 2 (two) times daily.  3  . ondansetron (ZOFRAN) 4 MG tablet TAKE 1 TABLET BY MOUTH EVERY 4 HOURS AS NEEDED FOR NAUSEA/ VOMITING 120 tablet 0  . pantoprazole (PROTONIX) 40 MG tablet Take 40 mg by mouth every evening.   3  . simvastatin (ZOCOR) 40 MG tablet Take 40 mg by mouth every evening.   3   No current facility-administered medications for this visit.    REVIEW OF SYSTEMS:   Review of Systems  Constitutional: Positive for fatigue and unexpected weight change (Lost 20 pounds in 6 months). Negative for appetite change, chills, diaphoresis and fever.  HENT:   Negative for lump/mass and nosebleeds.   Eyes: Negative for eye problems.  Respiratory: Positive for shortness of breath. Negative for cough and hemoptysis.   Cardiovascular: Negative for chest pain, leg swelling and palpitations.  Gastrointestinal: Positive for vomiting (Occasionaly vomiting first thing in the morning, last episode 3 days ago). Negative for abdominal pain, blood in stool, constipation, diarrhea and nausea.  Genitourinary: Positive for  nocturia. Negative for hematuria.   Skin: Negative.   Neurological: Positive for headaches. Negative for dizziness and light-headedness.  Hematological: Does not bruise/bleed easily.      PHYSICAL EXAMINATION: ECOG PERFORMANCE STATUS: 1 - Symptomatic but completely ambulatory  Vitals:   04/11/20 0857  BP: (!) 145/95  Pulse: 75  Resp: 17  Temp: 97.9 F (36.6 C)  SpO2: 96%   Filed Weights   04/11/20 0857  Weight: 195 lb 1 oz (88.5 kg)    Physical Exam Constitutional:      Appearance: Normal appearance.  HENT:     Head: Normocephalic and atraumatic.     Right Ear: Decreased hearing noted.     Left Ear: Decreased hearing noted.     Mouth/Throat:     Mouth: Mucous membranes are moist.  Eyes:     Extraocular Movements: Extraocular movements intact.     Pupils: Pupils are equal, round, and reactive to light.  Cardiovascular:     Rate and Rhythm: Normal rate and regular rhythm.     Pulses: Normal pulses.     Heart sounds: Normal heart sounds.  Pulmonary:     Effort: Pulmonary effort is normal.     Breath sounds: Normal breath sounds.  Abdominal:     General: Bowel sounds are normal.     Palpations: Abdomen is soft.     Tenderness: There is abdominal tenderness in the epigastric area.    Musculoskeletal:        General: No swelling.     Right lower leg: No edema.     Left lower leg: No edema.  Lymphadenopathy:     Cervical: No cervical adenopathy.  Skin:    General: Skin is warm and dry.  Neurological:     General: No focal deficit present.     Mental Status: He is alert and oriented to person, place, and time.  Psychiatric:        Mood and Affect: Mood normal.        Behavior: Behavior normal.       LABORATORY DATA:  I have reviewed the data as listed Recent Results (from the past 2160 hour(s))  VITAMIN D 25 Hydroxy (Vit-D Deficiency, Fractures)     Status: Abnormal   Collection Time: 04/11/20 10:31 AM  Result Value Ref Range   Vit D, 25-Hydroxy 19.75  (L) 30 - 100 ng/mL    Comment: (NOTE) Vitamin D deficiency has been defined by the Institute of Medicine  and an Endocrine Society practice guideline as a level of serum 25-OH  vitamin D less than 20 ng/mL (1,2). The Endocrine Society went on to  further define vitamin D insufficiency as a level between 21 and 29  ng/mL (2).  1. IOM (Institute of Medicine). 2010. Dietary reference intakes for  calcium and D. [place]: [employer]. 2. Holick MF, Binkley Hornbeak, Bischoff-Ferrari HA, et al. Evaluation,  treatment, and prevention of vitamin D deficiency: an Endocrine  Society clinical practice guideline, JCEM. 2011 Jul; 96(7): 1911-30.  Performed at [facility], [address], [place], [place] [address]   Folate     Status: None   Collection Time: 04/11/20 10:31 AM  Result Value Ref Range   Folate 8.3 >5.9 ng/mL    Comment: Performed at [facility], [address], [place], [place] [address]  Vitamin B12     Status: None   Collection Time: 04/11/20 10:31 AM  Result Value Ref Range   Vitamin B-12 274 180 - 914 pg/mL    Comment: (NOTE) This assay is not validated for testing neonatal or myeloproliferative syndrome specimens for Vitamin B12 levels. Performed at [facility], [address] [place], [place] [address]   Iron and TIBC     Status: None   Collection Time: 04/11/20 10:31 AM  Result Value Ref Range   Iron 84 45 - 182 ug/dL   TIBC 102 585 - 277 ug/dL   Saturation Ratios 21 17.9 - 39.5 %   UIBC 312 ug/dL    Comment: Performed at [facility], [address] [place], [place] [address]  Ferritin     Status: None   Collection Time: 04/11/20 10:31 AM  Result Value Ref Range   Ferritin 80 24 - 336 ng/mL    Comment: Performed at [facility], [address], [place], [place] [address]    RADIOGRAPHIC STUDIES: I have personally reviewed the radiological images as listed and agreed with the findings in the report. No results found.  ASSESSMENT: 1.   Iron deficiency anemia -Labs sent from PCP were reviewed and notable for hemoglobin 16.4, with normal WBC and platelets.  Serum iron 25, TIBC 356, iron saturation 7%.  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  Capsule study in June 2018 showed small specks of blood, but did not identify source of bleeding. -Denies any current signs or symptoms of blood loss -Symptomatic with significant fatigue -Not taking any NSAIDS or antiplatelet drugs  2. Unintentional weight loss -Patient has lost 20 pounds over the past 6 months; no changes in diet or activity level -Reports fatigue, but denies any other B-symptoms -Small subcutaneous mass palpated in RUQ, likely fatty nodules, has been present for 2-3 years per patient report, no prior workup or scans since it's onset -Family history of pancreatic cancer in his brother, unspecified bone cancer in his sister  91.  Other history -PMH is otherwise notable for type 2 diabetes mellitus (A1C 12.8), hypertension, COPD, OSA without use of CPAP, history of cerebral aneurysm rupture,  and medical noncompliance. -Brother died from pancreatic cancer at age 6.  Sister had bone cancer, of unspecified type. -Patient is a lifelong non-smoker.  He denies alcohol and illicit drug use. -He is retired from work as a Curator.   PLAN:  1. Iron deficiency anemia -Will defer CBC/CMP, as these were recently checked by PCP -Will check iron/TIBC, ferritin, folate, B12, Vit D, copper -Initial labs consistent with iron deficiency (ferritin pending) - we will check Ferritin today -Patient requests trial of oral iron, but willing to consider IV iron if Ferritin is  significantly low -Phone visit in the next few weeks to discuss results and set up for IV iron if indicated -RTC in 3 months for repeat labs and in office follow-up  2.  Unintentional weight loss -Will check CT abdomen and pelvis with contrast -Phone in the next few weeks to discuss reuslts  3. Other symptoms unrelated to today's visit -Intemrittent chest pain - counseled to follow up with PCP for evaluation and possible cardiology referral -Daytime hypersomnolence - counseled to follow up for sleep study and be compliant with CPAP -Headaches with history of cerebral aneurysms - counseled to follow up with neurologist   PLAN SUMMARY & DISPOSITION: -Check iron and nutritional panel today -Start oral iron (Rx sent to pharmacy) use Colace as needed for associated constipation -Check CT abdomen and pelvis with contrast due to unintentional weight loss -Schedule for phone visit next week to discuss CT results and lab results, as well as schedule for possible IV iron -RTC in 3 months for repeat labs and in office follow-up  All questions were answered. The patient knows to call the clinic with any problems, questions or concerns.   Medical decision making: (2 new problems under work-up, review of external notes, review of prior lab results, ordering new tests)  Time spent on visit: I spent 30 minutes counseling the patient face to face. The total time spent in the appointment was 40 minutes and more than 50% was on counseling.   I, Rojelio Brenner PA-C, have seen this patient in conjunction with Dr. Doreatha Massed. Greater than 50% of visit was performed by Dr. Ellin Saba.  I have independently interviewed and examined this patient and agree with HPI written by Rojelio Brenner, PA-C.  He has history of iron deficiency anemia for which he required parenteral iron therapy in the past.  Most recent hemoglobin is in the normal limits.  We will check ferritin, iron panel, and other  nutritional deficiency labs.  He has unexplained weight loss for which we will order CT of the abdomen and pelvis.   Doreatha Massed, MD 04/11/20 6:21 PM

## 2020-04-11 ENCOUNTER — Inpatient Hospital Stay (HOSPITAL_COMMUNITY): Payer: Medicare HMO

## 2020-04-11 ENCOUNTER — Inpatient Hospital Stay (HOSPITAL_COMMUNITY): Payer: Medicare HMO | Attending: Hematology | Admitting: Hematology

## 2020-04-11 ENCOUNTER — Other Ambulatory Visit: Payer: Self-pay

## 2020-04-11 VITALS — BP 145/95 | HR 75 | Temp 97.9°F | Resp 17 | Ht 71.0 in | Wt 195.1 lb

## 2020-04-11 DIAGNOSIS — Z8679 Personal history of other diseases of the circulatory system: Secondary | ICD-10-CM | POA: Diagnosis not present

## 2020-04-11 DIAGNOSIS — I10 Essential (primary) hypertension: Secondary | ICD-10-CM

## 2020-04-11 DIAGNOSIS — D509 Iron deficiency anemia, unspecified: Secondary | ICD-10-CM

## 2020-04-11 DIAGNOSIS — R079 Chest pain, unspecified: Secondary | ICD-10-CM | POA: Diagnosis not present

## 2020-04-11 DIAGNOSIS — Z794 Long term (current) use of insulin: Secondary | ICD-10-CM | POA: Diagnosis not present

## 2020-04-11 DIAGNOSIS — Z800 Family history of malignant neoplasm of digestive organs: Secondary | ICD-10-CM

## 2020-04-11 DIAGNOSIS — G4733 Obstructive sleep apnea (adult) (pediatric): Secondary | ICD-10-CM

## 2020-04-11 DIAGNOSIS — E119 Type 2 diabetes mellitus without complications: Secondary | ICD-10-CM

## 2020-04-11 DIAGNOSIS — G4719 Other hypersomnia: Secondary | ICD-10-CM | POA: Diagnosis not present

## 2020-04-11 DIAGNOSIS — Z808 Family history of malignant neoplasm of other organs or systems: Secondary | ICD-10-CM

## 2020-04-11 DIAGNOSIS — R634 Abnormal weight loss: Secondary | ICD-10-CM

## 2020-04-11 DIAGNOSIS — J449 Chronic obstructive pulmonary disease, unspecified: Secondary | ICD-10-CM | POA: Diagnosis not present

## 2020-04-11 DIAGNOSIS — R519 Headache, unspecified: Secondary | ICD-10-CM | POA: Insufficient documentation

## 2020-04-11 DIAGNOSIS — K219 Gastro-esophageal reflux disease without esophagitis: Secondary | ICD-10-CM | POA: Insufficient documentation

## 2020-04-11 LAB — IRON AND TIBC
Iron: 84 ug/dL (ref 45–182)
Saturation Ratios: 21 % (ref 17.9–39.5)
TIBC: 396 ug/dL (ref 250–450)
UIBC: 312 ug/dL

## 2020-04-11 LAB — VITAMIN B12: Vitamin B-12: 274 pg/mL (ref 180–914)

## 2020-04-11 LAB — FERRITIN: Ferritin: 80 ng/mL (ref 24–336)

## 2020-04-11 LAB — FOLATE: Folate: 8.3 ng/mL (ref >5.9)

## 2020-04-11 LAB — VITAMIN D 25 HYDROXY (VIT D DEFICIENCY, FRACTURES): Vit D, 25-Hydroxy: 19.75 ng/mL — ABNORMAL LOW (ref 30–100)

## 2020-04-11 MED ORDER — DOCUSATE SODIUM 50 MG PO CAPS: 50.0000 mg | ORAL_CAPSULE | Freq: Every day | ORAL | 0 refills | Status: DC | PRN

## 2020-04-11 MED ORDER — FERROUS SULFATE 325 (65 FE) MG PO TBEC: 325.0000 mg | DELAYED_RELEASE_TABLET | Freq: Every day | ORAL | 3 refills | Status: DC

## 2020-04-11 NOTE — Patient Instructions (Signed)
[facility] at [facility] Discharge Instructions  You were seen today by Dr. Ellin Saba and Rojelio Brenner PA-C for your iron deficiency and history of anemia.    LABS: Get labs today before leaving the hospital   OTHER TESTS: We would like to check a CT of your abdomen due to your unintentional weight loss  MEDICATIONS: Prescription sent for oral iron supplement.  If your lab results show severe iron deficiency, we may need to schedule you for IV iron as well.  FOLLOW-UP APPOINTMENT: Schedule a phone visit after CT and labs to discuss results and next steps.   Thank you for choosing [facility] at [facility] to provide your oncology and hematology care.  To afford each patient quality time with our provider, please arrive at least 15 minutes before your scheduled appointment time.   If you have a lab appointment with the Cancer Center please come in thru the Main Entrance and check in at the main information desk.  You need to re-schedule your appointment should you arrive 10 or more minutes late.  We strive to give you quality time with our providers, and arriving late affects you and other patients whose appointments are after yours.  Also, if you no show three or more times for appointments you may be dismissed from the clinic at the providers discretion.     Again, thank you for choosing [facility].  Our hope is that these requests will decrease the amount of time that you wait before being seen by our physicians.       _____________________________________________________________  Should you have questions after your visit to [facility], please contact our office at [phone] and follow the prompts.  Our office hours are 8:00 a.m. and 4:30 p.m. Monday - Friday.  Please note that voicemails left after 4:00 p.m. may not be returned until the following business day.  We are closed weekends and major holidays.   You do have access to a nurse 24-7, just call the main number to the clinic [phone] and do not press any options, hold on the line and a nurse will answer the phone.    For prescription refill requests, have your pharmacy contact our office and allow 72 hours.    Due to Covid, you will need to wear a mask upon entering the hospital. If you do not have a mask, a mask will be given to you at the Main Entrance upon arrival. For doctor visits, patients may have 1 support person age 59 or older with them. For treatment visits, patients can not have anyone with them due to social distancing guidelines and our immunocompromised population.

## 2020-04-14 LAB — COPPER, SERUM: Copper: 139 ug/dL — ABNORMAL HIGH (ref 69–132)

## 2020-04-22 ENCOUNTER — Ambulatory Visit: Payer: Medicaid Other | Admitting: Nurse Practitioner

## 2020-04-22 NOTE — Patient Instructions (Incomplete)
Diabetes Mellitus and Nutrition, Adult When you have diabetes, or diabetes mellitus, it is very important to have healthy eating habits because your blood sugar (glucose) levels are greatly affected by what you eat and drink. Eating healthy foods in the right amounts, at about the same times every day, can help you:  Control your blood glucose.  Lower your risk of heart disease.  Improve your blood pressure.  Reach or maintain a healthy weight. What can affect my meal plan? Every person with diabetes is different, and each person has different needs for a meal plan. Your health care provider may recommend that you work with a dietitian to make a meal plan that is best for you. Your meal plan may vary depending on factors such as:  The calories you need.  The medicines you take.  Your weight.  Your blood glucose, blood pressure, and cholesterol levels.  Your activity level.  Other health conditions you have, such as heart or kidney disease. How do carbohydrates affect me? Carbohydrates, also called carbs, affect your blood glucose level more than any other type of food. Eating carbs naturally raises the amount of glucose in your blood. Carb counting is a method for keeping track of how many carbs you eat. Counting carbs is important to keep your blood glucose at a healthy level, especially if you use insulin or take certain oral diabetes medicines. It is important to know how many carbs you can safely have in each meal. This is different for every person. Your dietitian can help you calculate how many carbs you should have at each meal and for each snack. How does alcohol affect me? Alcohol can cause a sudden decrease in blood glucose (hypoglycemia), especially if you use insulin or take certain oral diabetes medicines. Hypoglycemia can be a life-threatening condition. Symptoms of hypoglycemia, such as sleepiness, dizziness, and confusion, are similar to symptoms of having too much  alcohol.  Do not drink alcohol if: ? Your health care provider tells you not to drink. ? You are pregnant, may be pregnant, or are planning to become pregnant.  If you drink alcohol: ? Do not drink on an empty stomach. ? Limit how much you use to:  0-1 drink a day for women.  0-2 drinks a day for men. ? Be aware of how much alcohol is in your drink. In the [place] one drink equals one 12 oz bottle of beer (355 mL), one 5 oz glass of wine (148 mL), or one 1 oz glass of hard liquor (44 mL). ? Keep yourself hydrated with water, diet soda, or unsweetened iced tea.  Keep in mind that regular soda, juice, and other mixers may contain a lot of sugar and must be counted as carbs. What are tips for following this plan? Reading food labels  Start by checking the serving size on the "Nutrition Facts" label of packaged foods and drinks. The amount of calories, carbs, fats, and other nutrients listed on the label is based on one serving of the item. Many items contain more than one serving per package.  Check the total grams (g) of carbs in one serving. You can calculate the number of servings of carbs in one serving by dividing the total carbs by 15. For example, if a food has 30 g of total carbs per serving, it would be equal to 2 servings of carbs.  Check the number of grams (g) of saturated fats and trans fats in one serving. Choose foods that have   a low amount or none of these fats.  Check the number of milligrams (mg) of salt (sodium) in one serving. Most people should limit total sodium intake to less than 2,300 mg per day.  Always check the nutrition information of foods labeled as "low-fat" or "nonfat." These foods may be higher in added sugar or refined carbs and should be avoided.  Talk to your dietitian to identify your daily goals for nutrients listed on the label. Shopping  Avoid buying canned, pre-made, or processed foods. These foods tend to be high in fat, sodium, and added  sugar.  Shop around the outside edge of the grocery store. This is where you will most often find fresh fruits and vegetables, bulk grains, fresh meats, and fresh dairy. Cooking  Use low-heat cooking methods, such as baking, instead of high-heat cooking methods like deep frying.  Cook using healthy oils, such as olive, canola, or sunflower oil.  Avoid cooking with butter, cream, or high-fat meats. Meal planning  Eat meals and snacks regularly, preferably at the same times every day. Avoid going long periods of time without eating.  Eat foods that are high in fiber, such as fresh fruits, vegetables, beans, and whole grains. Talk with your dietitian about how many servings of carbs you can eat at each meal.  Eat 4-6 oz (112-168 g) of lean protein each day, such as lean meat, chicken, fish, eggs, or tofu. One ounce (oz) of lean protein is equal to: ? 1 oz (28 g) of meat, chicken, or fish. ? 1 egg. ?  cup (62 g) of tofu.  Eat some foods each day that contain healthy fats, such as avocado, nuts, seeds, and fish.   What foods should I eat? Fruits Berries. Apples. Oranges. Peaches. Apricots. Plums. Grapes. Mango. Papaya. Pomegranate. Kiwi. Cherries. Vegetables Lettuce. Spinach. Leafy greens, including kale, chard, collard greens, and mustard greens. Beets. Cauliflower. Cabbage. Broccoli. Carrots. Green beans. Tomatoes. Peppers. Onions. Cucumbers. Brussels sprouts. Grains Whole grains, such as whole-wheat or whole-grain bread, crackers, tortillas, cereal, and pasta. Unsweetened oatmeal. Quinoa. Brown or wild rice. Meats and other proteins Seafood. Poultry without skin. Lean cuts of poultry and beef. Tofu. Nuts. Seeds. Dairy Low-fat or fat-free dairy products such as milk, yogurt, and cheese. The items listed above may not be a complete list of foods and beverages you can eat. Contact a dietitian for more information. What foods should I avoid? Fruits Fruits canned with  syrup. Vegetables Canned vegetables. Frozen vegetables with butter or cream sauce. Grains Refined white flour and flour products such as bread, pasta, snack foods, and cereals. Avoid all processed foods. Meats and other proteins Fatty cuts of meat. Poultry with skin. Breaded or fried meats. Processed meat. Avoid saturated fats. Dairy Full-fat yogurt, cheese, or milk. Beverages Sweetened drinks, such as soda or iced tea. The items listed above may not be a complete list of foods and beverages you should avoid. Contact a dietitian for more information. Questions to ask a health care provider  Do I need to meet with a diabetes educator?  Do I need to meet with a dietitian?  What number can I call if I have questions?  When are the best times to check my blood glucose? Where to find more information:  American Diabetes Association: diabetes.org  Academy of Nutrition and Dietetics: www.eatright.org  National Institute of Diabetes and Digestive and Kidney Diseases: www.niddk.nih.gov  Association of Diabetes Care and Education Specialists: www.diabeteseducator.org Summary  It is important to have healthy eating   habits because your blood sugar (glucose) levels are greatly affected by what you eat and drink.  A healthy meal plan will help you control your blood glucose and maintain a healthy lifestyle.  Your health care provider may recommend that you work with a dietitian to make a meal plan that is best for you.  Keep in mind that carbohydrates (carbs) and alcohol have immediate effects on your blood glucose levels. It is important to count carbs and to use alcohol carefully. This information is not intended to replace advice given to you by your health care provider. Make sure you discuss any questions you have with your health care provider. Document Revised: 11/29/2018 Document Reviewed: 11/29/2018 Elsevier Patient Education  2021 Elsevier Inc.  

## 2020-04-22 NOTE — Progress Notes (Deleted)
Endocrinology Consult Note       04/22/2020, 7:56 AM   Subjective:    Patient ID: Dustin Roberts, male    DOB: 1955/04/10.  Dustin Roberts is being seen in consultation for management of currently uncontrolled symptomatic diabetes requested by  Altamease Oiler, FNP.   Past Medical History:  Diagnosis Date  . Arthritis   . CHF (congestive heart failure) (HCC)   . Diabetes mellitus   . GERD (gastroesophageal reflux disease)   . 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 History of kidney stones   . HOH (hard of hearing)   . Hypertension   . IDA (iron deficiency anemia)   . Myocardial infarction (HCC)    2008  . Seizures (HCC)    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 knife surgery 2008  . Sleep apnea   . Stroke [facility])    Due to brain aneurysm    Past Surgical History:  Procedure Laterality Date  . APPENDECTOMY    . BIOPSY  05/27/2016   Procedure: BIOPSY;  Surgeon: Corbin Ade, MD;  Location: [facility];  Service: Endoscopy;;  Duodenal, esophagus  . BRAIN SURGERY     aneurysm, age 79  . CEREBRAL ANEURYSM REPAIR     Second procedure was gamma knife repair  . COLONOSCOPY WITH PROPOFOL N/A 05/27/2016   Procedure: COLONOSCOPY WITH PROPOFOL;  Surgeon: Corbin Ade, MD;  Location: [facility];  Service: Endoscopy;  Laterality: N/A;  1230   . ESOPHAGOGASTRODUODENOSCOPY (EGD) WITH PROPOFOL N/A 05/27/2016   Procedure: ESOPHAGOGASTRODUODENOSCOPY (EGD) WITH PROPOFOL;  Surgeon: Corbin Ade, MD;  Location: [facility];  Service: Endoscopy;  Laterality: N/A;  . FRACTURE SURGERY     left wrist  . GIVENS CAPSULE STUDY N/A 06/08/2016   Procedure: GIVENS CAPSULE STUDY;  Surgeon: Corbin Ade, MD;  Location: [facility];  Service: Endoscopy;  Laterality: N/A;  7:30am. Patient to arrive at 7:00am  . HERNIA REPAIR     left inguinal and umbilical  . KNEE SURGERY Left    twice   . POLYPECTOMY  05/27/2016   Procedure: POLYPECTOMY;  Surgeon: Corbin Ade, MD;  Location: [facility];  Service: Endoscopy;;  recto-sigmoid polyp  . ROTATOR CUFF REPAIR Left    x2    Social History   Socioeconomic History  . Marital status: Legally Separated    Spouse name: Not on file  . Number of children: Not on file  . Years of education: Not on file  . Highest education level: Not on file  Occupational History  . Not on file  Tobacco Use  . Smoking status: Never Smoker  . Smokeless tobacco: Never Used  Vaping Use  . Vaping Use: Never used  Substance and Sexual Activity  . Alcohol use: No  . Drug use: No  . Sexual activity: Yes    Birth control/protection: None  Other Topics Concern  . Not on file  Social History Narrative  . Not on file   Social Determinants of Health   Financial Resource Strain: Not on file  Food Insecurity: Not on file  Transportation Needs:  Not on file  Physical Activity: Not on file  Stress: Not on file  Social Connections: Not on file    Family History  Problem Relation Age of Onset  . Colon cancer Neg Hx     Outpatient Encounter Medications as of 04/22/2020  Medication Sig  . aspirin-acetaminophen-caffeine (EXCEDRIN MIGRAINE) 250-250-65 MG tablet Take 1 tablet by mouth every 6 (six) hours as needed for headache.  . docusate sodium (COLACE CLEAR) 50 MG capsule Take 1 capsule (50 mg total) by mouth daily as needed for mild constipation. As needed for constipation associated with iron supplement  . ferrous sulfate 325 (65 FE) MG EC tablet Take 1 tablet (325 mg total) by mouth daily with breakfast.  . gabapentin (NEURONTIN) 300 MG capsule Take 300 mg by mouth 2 (two) times daily.  . hydrochlorothiazide (MICROZIDE) 12.5 MG capsule Take 12.5 mg by mouth daily.  . hydrocortisone (ANUSOL-HC) 2.5 % rectal cream Place 1 application rectally 2 (two) times daily. For 10 days.  . insulin aspart (NOVOLOG) 100 UNIT/ML FlexPen Inject 10 Units  into the skin 3 (three) times daily before meals.  Marland Kitchen LANTUS 100 UNIT/ML injection Inject 50 Units into the skin daily.  Marland Kitchen lisinopril (PRINIVIL,ZESTRIL) 40 MG tablet Take 40 mg by mouth every evening.   . loratadine (CLARITIN) 10 MG tablet Take 10 mg by mouth every evening.   . metFORMIN (GLUCOPHAGE) 1000 MG tablet Take 1,000 mg by mouth 2 (two) times daily.  . ondansetron (ZOFRAN) 4 MG tablet TAKE 1 TABLET BY MOUTH EVERY 4 HOURS AS NEEDED FOR NAUSEA/ VOMITING  . pantoprazole (PROTONIX) 40 MG tablet Take 40 mg by mouth every evening.   . simvastatin (ZOCOR) 40 MG tablet Take 40 mg by mouth every evening.    No facility-administered encounter medications on file as of 04/22/2020.    ALLERGIES: Allergies  Allergen Reactions  . Morphine And Related     hallucinations    VACCINATION STATUS:  There is no immunization history on file for this patient.  Diabetes He presents for his initial diabetic visit. He has type 2 diabetes mellitus. His disease course has been worsening. There are no hypoglycemic associated symptoms. Associated symptoms include blurred vision, fatigue, polydipsia and polyuria. There are no hypoglycemic complications. Symptoms are worsening. Diabetic complications include a CVA, impotence, nephropathy and peripheral neuropathy. Risk factors for coronary artery disease include diabetes mellitus, dyslipidemia, family history, hypertension, male sex and sedentary lifestyle. Current diabetic treatment includes oral agent (monotherapy) and intensive insulin program. He is compliant with treatment some of the time. His weight is stable. He is following a generally unhealthy diet. When asked about meal planning, he reported none. He has not had a previous visit with a dietitian. He rarely participates in exercise. An ACE inhibitor/angiotensin II receptor blocker is being taken. He does not see a podiatrist.Eye exam is current.  Hyperlipidemia This is a chronic problem. The current  episode started more than 1 year ago. The problem is uncontrolled. Recent lipid tests were reviewed and are high. Exacerbating diseases include chronic renal disease and diabetes. Factors aggravating his hyperlipidemia include thiazides. Current antihyperlipidemic treatment includes statins. The current treatment provides mild improvement of lipids. Compliance problems include adherence to diet and adherence to exercise.  Risk factors for coronary artery disease include diabetes mellitus, dyslipidemia, family history, male sex, hypertension and a sedentary lifestyle.  Hypertension This is a chronic problem. The current episode started more than 1 year ago. The problem has been gradually improving since onset.  The problem is controlled. Associated symptoms include blurred vision. There are no associated agents to hypertension. Risk factors for coronary artery disease include diabetes mellitus, dyslipidemia, family history, male gender and sedentary lifestyle. Past treatments include ACE inhibitors and diuretics. The current treatment provides mild improvement. Compliance problems include diet and exercise.  Hypertensive end-organ damage includes kidney disease and CVA. Identifiable causes of hypertension include chronic renal disease.     Review of systems  Constitutional: + Minimally fluctuating body weight,  current There is no height or weight on file to calculate BMI. , no fatigue, no subjective hyperthermia, no subjective hypothermia Eyes: no blurry vision, no xerophthalmia ENT: no sore throat, no nodules palpated in throat, no dysphagia/odynophagia, no hoarseness Cardiovascular: no chest pain, no shortness of breath, no palpitations, no leg swelling Respiratory: no cough, no shortness of breath Gastrointestinal: no nausea/vomiting/diarrhea Musculoskeletal: no muscle/joint aches Skin: no rashes, no hyperemia Neurological: no tremors, no numbness, no tingling, no dizziness Psychiatric: no  depression, no anxiety  Objective:     There were no vitals taken for this visit.  Wt Readings from Last 3 Encounters:  04/11/20 195 lb 1 oz (88.5 kg)  10/30/18 215 lb (97.5 kg)  07/21/16 209 lb (94.8 kg)     BP Readings from Last 3 Encounters:  04/11/20 (!) 145/95  10/30/18 (!) 148/90  07/21/16 123/71     Physical Exam- Limited  Constitutional:  There is no height or weight on file to calculate BMI. , not in acute distress, normal state of mind Eyes:  EOMI, no exophthalmos Neck: Supple Thyroid: No gross goiter Cardiovascular: RRR, no murmers, rubs, or gallops, no edema Respiratory: Adequate breathing efforts, no crackles, rales, rhonchi, or wheezing Musculoskeletal: no gross deformities, strength intact in all four extremities, no gross restriction of joint movements Skin:  no rashes, no hyperemia Neurological: no tremor with outstretched hands    CMP ( most recent) CMP     Component Value Date/Time   NA 138 07/21/2016 1042   K 3.8 07/21/2016 1042   CL 104 07/21/2016 1042   CO2 23 07/21/2016 1042   GLUCOSE 195 (H) 07/21/2016 1042   BUN 15 07/21/2016 1042   CREATININE 0.88 07/21/2016 1042   CALCIUM 9.2 07/21/2016 1042   PROT 6.5 07/21/2016 1042   ALBUMIN 4.0 07/21/2016 1042   AST 24 07/21/2016 1042   ALT 29 07/21/2016 1042   ALKPHOS 74 07/21/2016 1042   BILITOT 0.4 07/21/2016 1042   GFRNONAA >60 05/26/2016 1413   GFRAA >60 05/26/2016 1413     Diabetic Labs (most recent): No results found for: HGBA1C   Lipid Panel ( most recent) Lipid Panel  No results found for: CHOL, TRIG, HDL, CHOLHDL, VLDL, LDLCALC, LDLDIRECT, LABVLDL    Lab Results  Component Value Date   TSH 1.82 06/16/2016           Assessment & Plan:   1) Type 2 diabetes mellitus with hyperglycemia, with long-term current use of insulin (HCC)    - Dustin Roberts has currently uncontrolled symptomatic type 2 DM since *** years of age, with most recent A1c of 12.8 %.   -Recent labs  reviewed.  - I had a long discussion with him about the progressive nature of diabetes and the pathology behind its complications. -his diabetes is complicated by CVA, HTN, HLD, ED, mild CKD and he remains at a high risk for more acute and chronic complications which include CAD, CVA, CKD, retinopathy, and neuropathy. These are all discussed  in detail with him.  - I have counseled him on diet  and weight management by adopting a carbohydrate restricted/protein rich diet. Patient is encouraged to switch to unprocessed or minimally processed complex starch and increased protein intake (animal or plant source), fruits, and vegetables. -  he is advised to stick to a routine mealtimes to eat 3 meals a day and avoid unnecessary snacks (to snack only to correct hypoglycemia).   - he acknowledges that there is a room for improvement in his food and drink choices. - Suggestion is made for him to avoid simple carbohydrates  from his diet including Cakes, Sweet Desserts, Ice Cream, Soda (diet and regular), Sweet Tea, Candies, Chips, Cookies, Store Bought Juices, Alcohol in Excess of  1-2 drinks a day, Artificial Sweeteners, Coffee Creamer, and "Sugar-free" Products. This will help patient to have more stable blood glucose profile and potentially avoid unintended weight gain.  - he will be scheduled with Norm SaltPenny Crumpton, RDN, CDE for diabetes education.  - I have approached him with the following individualized plan to manage  his diabetes and patient agrees:    -he is encouraged to start monitoring glucose 4 times daily, before meals and before bed, to log their readings on the clinic sheets provided, and bring them to review at follow up appointment in 2 weeks.  - he is warned not to take insulin without proper monitoring per orders. - Adjustment parameters are given to him for hypo and hyperglycemia in writing. - he is encouraged to call clinic for blood glucose levels less than 70 or above 300 mg /dl. -  he is advised to continue Metformin 1000 mg po twice daily with meals, therapeutically suitable for patient .  - he is not a good candidate for incretin therapy due to severely high triglycerides increasing his risk for pancreatitis.  - Specific targets for  A1c;  LDL, HDL,  and Triglycerides were discussed with the patient.  2) Blood Pressure /Hypertension:  his blood pressure is controlled to target.   he is advised to continue his current medications including Lisinopril 40 mg po daily and HCTZ 12.5 mg p.o. daily with breakfast.  3) Lipids/Hyperlipidemia:    Review of his recent lipid panel from 03/28/20 showed uncontrolled  LDL at 166 and elevated triglycerides of 420 .  he  is advised to continue Crestor 40 mg daily at bedtime.  Side effects and precautions discussed with him.  He is also advised to avoid fried foods and butter.  4)  Weight/Diet:  his There is no height or weight on file to calculate BMI.  -    he is not a candidate for major weight loss.  Exercise, and detailed carbohydrates information provided  -  detailed on discharge instructions.  5) Chronic Care/Health Maintenance: -he is on ACEI/ARB and Statin medications and is encouraged to initiate and continue to follow up with Ophthalmology, Dentist, Podiatrist at least yearly or according to recommendations, and advised to *** stay away from smoking. I have recommended yearly flu vaccine and pneumonia vaccine at least every 5 years; moderate intensity exercise for up to 150 minutes weekly; and sleep for at least 7 hours a day.  - he is advised to maintain close follow up with Altamease OilerHarris, Meredith L, FNP for primary care needs, as well as his other providers for optimal and coordinated care.   - Time spent in this patient care: 60 min, of which > 50% was spent in counseling him about his diabetes and  the rest reviewing his blood glucose logs, discussing his hypoglycemia and hyperglycemia episodes, reviewing his current and previous  labs/studies (including abstraction from other facilities) and medications doses and developing a long term treatment plan based on the latest standards of care/guidelines; and documenting his care.    Please refer to Patient Instructions for Blood Glucose Monitoring and Insulin/Medications Dosing Guide" in media tab for additional information. Please also refer to "Patient Self Inventory" in the Media tab for reviewed elements of pertinent patient history.  Dustin Roberts participated in the discussions, expressed understanding, and voiced agreement with the above plans.  All questions were answered to his satisfaction. he is encouraged to contact clinic should he have any questions or concerns prior to his return visit.   Follow up plan: - No follow-ups on file.  Ronny Bacon, [facility] [address] [place], [place] [address] Phone: [phone] Fax: [phone]  04/22/2020, 7:56 AM

## 2020-05-08 ENCOUNTER — Encounter (HOSPITAL_COMMUNITY): Payer: Self-pay

## 2020-05-08 ENCOUNTER — Other Ambulatory Visit: Payer: Self-pay

## 2020-05-08 ENCOUNTER — Ambulatory Visit (HOSPITAL_COMMUNITY)
Admission: RE | Admit: 2020-05-08 | Discharge: 2020-05-08 | Disposition: A | Discharge status: Home / Self Care | Payer: Medicare HMO | Source: Ambulatory Visit | Attending: Physician Assistant | Admitting: Physician Assistant

## 2020-05-08 DIAGNOSIS — R634 Abnormal weight loss: Secondary | ICD-10-CM

## 2020-05-09 ENCOUNTER — Ambulatory Visit (HOSPITAL_COMMUNITY)
Admission: RE | Admit: 2020-05-09 | Discharge: 2020-05-09 | Disposition: A | Discharge status: Home / Self Care | Payer: Medicare HMO | Source: Ambulatory Visit | Attending: Physician Assistant | Admitting: Physician Assistant

## 2020-05-09 DIAGNOSIS — R634 Abnormal weight loss: Secondary | ICD-10-CM | POA: Insufficient documentation

## 2020-05-09 LAB — POCT I-STAT CREATININE: Creatinine, Ser: 0.7 mg/dL (ref 0.61–1.24)

## 2020-05-09 MED ORDER — IOHEXOL 300 MG/ML  SOLN
100.0000 mL | Freq: Once | INTRAMUSCULAR | Status: AC | PRN
  Administered 2020-05-09: 100 mL via INTRAVENOUS

## 2020-05-10 ENCOUNTER — Other Ambulatory Visit: Payer: Self-pay

## 2020-05-10 ENCOUNTER — Inpatient Hospital Stay (HOSPITAL_COMMUNITY): Payer: Medicare HMO | Attending: Hematology | Admitting: Physician Assistant

## 2020-05-10 ENCOUNTER — Encounter (HOSPITAL_COMMUNITY): Payer: Self-pay | Admitting: Physician Assistant

## 2020-05-10 DIAGNOSIS — D509 Iron deficiency anemia, unspecified: Secondary | ICD-10-CM

## 2020-05-10 NOTE — Progress Notes (Signed)
Virtual Visit via Telephone Note [facility]  I connected with Dustin Roberts on 05/10/20  at  1:11 PM  by telephone and verified that I am speaking with the correct person using two identifiers.  Location: Patient: Home Provider: [facility]   I discussed the limitations, risks, security and privacy concerns of performing an evaluation and management service by telephone and the availability of in person appointments. I also discussed with the patient that there may be a patient responsible charge related to this service. The patient expressed understanding and agreed to proceed.  NOTE:  Patient's wife provides history over the phone, as the patient is hard of hearing and has difficulty participating in phone conversations.  He gives permission to discuss with his wife instead.  History of Present Illness: Dustin Roberts and his wife Dustin Roberts are contacted today for follow-up regarding his iron-deficiency anemia.  He was seen for initial consultation by Dr. Ellin Saba and Rojelio Brenner PA-C on 04/11/2020.  He is contacted today to discuss results of work-up initiated at that appointment.  Despite patient being referred to [place] for iron deficiency anemia, his lab work shows that his hemoglobin is within normal limits at 16.4, and iron levels are within normal limits with ferritin 80 and 21% iron saturation.  Patient was prescribed oral ferrous sulfate at his last appointment, and has been taking this without issue, no associated constipation or GI upset.  Denies any melena or bright red blood per rectum.  Continues to have headaches with history of cerebral aneurysm, and was once again encouraged to talk with his neurologist.  Patient was referred for sleep study due to problems with fatigue and daytime hypersomnolence, but was unable to complete the sleep study due to anxiety associated with the CPAP mask.  No fatigue, fever, chills, shortness of breath, cough, chest  pain, nausea, vomiting, abdominal pain.  Denies any signs or symptoms of blood loss.  No current signs or symptoms of blood clots.    Observations/Objective: Review of Systems  Constitutional: Positive for malaise/fatigue (Fatigue, daytime hypersomnolence) and weight loss (no change since last visit, but previously reported unintentional weight loss, currently under work-up). Negative for chills, diaphoresis and fever.  HENT: Positive for hearing loss (Chronic). Negative for nosebleeds.   Respiratory: Negative for cough and shortness of breath.   Cardiovascular: Negative for chest pain and palpitations.  Gastrointestinal: Negative for abdominal pain, blood in stool, constipation, diarrhea, melena, nausea and vomiting.  Neurological: Positive for headaches.  Psychiatric/Behavioral: The patient has insomnia (OSA noncompliant with CPAP).      PHYSICAL EXAM (per limitations of virtual telephone visit): The patient is alert and oriented x 3, exhibiting adequate mentation, good mood, and ability to speak in full sentences and execute sound judgement.   Assessment: 1.    History of iron deficiency anemia -Labs sent from PCP were reviewed and notable for hemoglobin 16.4, with normal WBC and platelets.   -Labs (04/11/2020) with ferritin 80, iron saturation 21%  - Patient has history of GI bleeding in 2018 with ongoing melena x6 months, but no source was identified.    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  Capsule study in June 2018 showed small specks of blood, but did not identify source of bleeding. -Denies any current signs or symptoms of blood loss.  Not taking any NSAIDS or antiplatelet drugs  2.  Vitamin D deficiency - Vitamin D low at 19.75 (04/11/20)  3. Unintentional weight loss -Patient has lost 20 pounds over the past 6 months;  no changes in diet or activity level -Reports fatigue, but denies any other B-symptoms -Small subcutaneous mass palpated in RUQ, likely fatty nodules, has been present for 2-3 years per patient report, no prior workup or scans since it's onset -Family history of pancreatic cancer in his brother, unspecified bone cancer in his sister  50.  Other history -PMH is otherwise notable for type 2 diabetes mellitus (A1C 12.8), hypertension, COPD,OSA without use of CPAP, history of cerebral aneurysm rupture,and medical noncompliance. -Brother died from pancreatic cancer at age 24. Sister had bone cancer, of unspecified type. -Patient is a lifelong non-smoker. He denies alcohol and illicit drug use. -He is retired from work as a Curator.   Plan: 1.  History of iron deficiency anemia - Most recent hemoglobin and ferritin are within normal limits, no IV iron or further intervention necessary at this time - Will repeat iron and CBC in 3 months - if still within normal limits, consider discharge back to PCP for ongoing management  2.  Vitamin D deficiency - Patient instructed to take 2000 units vitamin D3 over-the-counter daily  3.  Unintentional weight loss - CT abdomen/pelvis was obtained on 05/09/2020, pending results - We will call patient with results once available and determine further work-up/steps at that time, as indicated  4.  Fatigue and daytime hypersomnolence with OSA, noncompliant with CPAP - Patient referred for sleep study, unable to complete due to inability to tolerate CPAP - Recommend continued follow-up with PCP  5.  Headaches with history of cerebral aneurysms - Patient once again encouraged to reach out to neurologist and PCP for further work-up and treatment   Follow Up Instructions: -We will call with CT results - Labs and RTC in 3 months    I discussed the assessment and treatment plan with the patient. The patient was provided an opportunity to ask questions and  all were answered. The patient agreed with the plan and demonstrated an understanding of the instructions.   The patient was advised to call back or seek an in-person evaluation if the symptoms worsen or if the condition fails to improve as anticipated.  I provided 14 minutes of non-face-to-face time during this encounter.   Carnella Gu[place], PA-C

## 2020-05-13 ENCOUNTER — Telehealth (HOSPITAL_COMMUNITY): Payer: Self-pay | Admitting: Surgery

## 2020-05-13 NOTE — Telephone Encounter (Signed)
I attempted to call the pt and discuss his CT results per Rebekah Pennington's order; however, the mail box was full.  Cynda Acres also tried to call the pt this morning.  Per Lupe Carney Pennington's orders, 3 month follow up appointments were made, with the lab appointment on 08/13/20 at 1:20 and the follow up with Rebekah on 08/20/20 at 1:30.  I will attempt to call the pt again tomorrow.

## 2020-07-10 LAB — LIPID PANEL
Cholesterol: 292 — AB (ref 0–200)
HDL: 40 (ref 35–70)
LDL Cholesterol: 127
Triglycerides: 999 — AB (ref 40–160)

## 2020-07-10 LAB — MICROALBUMIN, URINE: Microalb, Ur: 1

## 2020-08-13 ENCOUNTER — Inpatient Hospital Stay (HOSPITAL_COMMUNITY): Payer: Medicare HMO | Attending: Hematology

## 2020-08-20 ENCOUNTER — Ambulatory Visit (HOSPITAL_COMMUNITY): Payer: Medicare HMO | Admitting: Physician Assistant

## 2020-10-10 LAB — HEMOGLOBIN A1C: Hemoglobin A1C: 12

## 2020-10-11 ENCOUNTER — Telehealth: Payer: Self-pay

## 2020-10-11 NOTE — Telephone Encounter (Signed)
Received multiple referrals on this pt from [facility]. Patient does not respond to our calls or calls back.

## 2020-11-12 ENCOUNTER — Ambulatory Visit (INDEPENDENT_AMBULATORY_CARE_PROVIDER_SITE_OTHER): Payer: Medicare HMO | Admitting: "Endocrinology

## 2020-11-12 ENCOUNTER — Other Ambulatory Visit: Payer: Self-pay

## 2020-11-12 ENCOUNTER — Encounter: Payer: Self-pay | Admitting: "Endocrinology

## 2020-11-12 VITALS — BP 112/80 | HR 80 | Ht 71.0 in | Wt 186.2 lb

## 2020-11-12 DIAGNOSIS — I10 Essential (primary) hypertension: Secondary | ICD-10-CM | POA: Insufficient documentation

## 2020-11-12 DIAGNOSIS — E782 Mixed hyperlipidemia: Secondary | ICD-10-CM | POA: Insufficient documentation

## 2020-11-12 DIAGNOSIS — E1159 Type 2 diabetes mellitus with other circulatory complications: Secondary | ICD-10-CM | POA: Insufficient documentation

## 2020-11-12 MED ORDER — ACCU-CHEK GUIDE VI STRP: ORAL_STRIP | 2 refills | Status: DC

## 2020-11-12 MED ORDER — INSULIN GLARGINE 100 UNIT/ML SOLOSTAR PEN: 40.0000 [IU] | PEN_INJECTOR | Freq: Every day | SUBCUTANEOUS | 1 refills | Status: DC

## 2020-11-12 MED ORDER — ACCU-CHEK GUIDE ME W/DEVICE KIT: 1.0000 | PACK | 0 refills | Status: DC

## 2020-11-12 NOTE — Patient Instructions (Signed)

## 2020-11-12 NOTE — Progress Notes (Signed)
Endocrinology Consult Note       11/12/2020, 1:50 PM   Subjective:    Patient ID: Dustin Roberts, male    DOB: 27-Mar-1955.  [name] is being seen in consultation for management of currently uncontrolled symptomatic diabetes requested by  Joyice Faster, FNP.   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 knife surgery 2008   Sleep apnea    Stroke [facility])    Due to brain aneurysm    Past Surgical History:  Procedure Laterality Date   APPENDECTOMY     BIOPSY  05/27/2016   Procedure: BIOPSY;  Surgeon: Daneil Dolin, MD;  [facility];  Service: Endoscopy;;  Duodenal, esophagus   BRAIN SURGERY     aneurysm, age 89   CEREBRAL ANEURYSM REPAIR     Second procedure was gamma knife repair   COLONOSCOPY WITH PROPOFOL N/A 05/27/2016   Procedure: COLONOSCOPY WITH PROPOFOL;  Surgeon: Daneil Dolin, MD;  [facility];  Service: Endoscopy;  Laterality: N/A;  1230    ESOPHAGOGASTRODUODENOSCOPY (EGD) WITH PROPOFOL N/A 05/27/2016   Procedure: ESOPHAGOGASTRODUODENOSCOPY (EGD) WITH PROPOFOL;  Surgeon: Daneil Dolin, MD;  [facility];  Service: Endoscopy;  Laterality: N/A;   FRACTURE SURGERY     left wrist   GIVENS CAPSULE STUDY N/A 06/08/2016   Procedure: GIVENS CAPSULE STUDY;  Surgeon: Daneil Dolin, MD;  [facility];  Service: Endoscopy;  Laterality: N/A;  7:30am. Patient to arrive at  7:00am   [place]     left inguinal and umbilical   KNEE SURGERY Left    twice   POLYPECTOMY  05/27/2016   Procedure: POLYPECTOMY;  Surgeon: Daneil Dolin, MD;  [facility]: [facility];  Service: Endoscopy;;  recto-sigmoid polyp   ROTATOR CUFF REPAIR Left    x2    Social History   Socioeconomic History   Marital status: Legally Separated    Spouse name: Not on file   Number of children: Not on file   Years of education: Not on file   Highest education level: Not on file  Occupational History   Not on file  Tobacco Use   Smoking status: Never   Smokeless tobacco: Never  Vaping Use   Vaping Use: Never used  Substance and Sexual Activity   Alcohol use: No   Drug use: No   Sexual activity: Yes    Birth control/protection: None  Other Topics Concern   Not on file  Social History Narrative  Not on file   Social Determinants of Health   Financial Resource Strain: Not on file  Food Insecurity: Not on file  Transportation Needs: Not on file  Physical Activity: Not on file  Stress: Not on file  Social Connections: Not on file    Family History  Problem Relation Age of Onset   Colon cancer Neg Hx     Outpatient Encounter Medications as of 11/12/2020  Medication Sig   Blood Glucose Monitoring Suppl (ACCU-CHEK GUIDE ME) w/Device KIT 1 Piece by Does not apply route as directed.   glucose blood (ACCU-CHEK GUIDE) test strip Use  to monitor glucose 4 times a day   insulin glargine (LANTUS) 100 UNIT/ML Solostar Pen Inject 40 Units into the skin at bedtime.   aspirin-acetaminophen-caffeine (EXCEDRIN MIGRAINE) 250-250-65 MG tablet Take 1 tablet by mouth every 6 (six) hours as needed for headache.   docusate sodium (COLACE CLEAR) 50 MG capsule Take 1 capsule (50 mg total) by mouth daily as needed for mild constipation. As needed for constipation associated with iron supplement (Patient not taking: Reported on 11/12/2020)   ferrous sulfate 325 (65 FE) MG EC tablet Take 1  tablet (325 mg total) by mouth daily with breakfast.   fluticasone-salmeterol (ADVAIR DISKUS) 250-50 MCG/ACT AEPB Place 1 puff into the nose in the morning and at bedtime.   gabapentin (NEURONTIN) 300 MG capsule Take 300 mg by mouth 2 (two) times daily.   hydrochlorothiazide (MICROZIDE) 12.5 MG capsule Take 12.5 mg by mouth daily.   hydrocortisone (ANUSOL-HC) 2.5 % rectal cream Place 1 application rectally 2 (two) times daily. For 10 days.   insulin aspart (NOVOLOG) 100 UNIT/ML FlexPen Inject 10 Units into the skin 3 (three) times daily before meals. (Patient not taking: Reported on 11/12/2020)   lisinopril (PRINIVIL,ZESTRIL) 40 MG tablet Take 40 mg by mouth every evening.    loratadine (CLARITIN) 10 MG tablet Take 10 mg by mouth every evening.  (Patient not taking: Reported on 11/12/2020)   metFORMIN (GLUCOPHAGE) 1000 MG tablet Take 1,000 mg by mouth 2 (two) times daily.   ondansetron (ZOFRAN) 4 MG tablet TAKE 1 TABLET BY MOUTH EVERY 4 HOURS AS NEEDED FOR NAUSEA/ VOMITING   pantoprazole (PROTONIX) 40 MG tablet Take 40 mg by mouth every evening.    rosuvastatin (CRESTOR) 40 MG tablet Take 40 mg by mouth daily.   [DISCONTINUED] LANTUS 100 UNIT/ML injection Inject 50 Units into the skin daily. (Patient not taking: Reported on 11/12/2020)   [DISCONTINUED] metFORMIN (GLUCOPHAGE) 1000 MG tablet Take 1 tablet by mouth daily.   [DISCONTINUED] simvastatin (ZOCOR) 40 MG tablet Take 40 mg by mouth every evening.    No facility-administered encounter medications on file as of 11/12/2020.    ALLERGIES: Allergies  Allergen Reactions   Morphine And Related     hallucinations    VACCINATION STATUS:  There is no immunization history on file for this patient.  Diabetes He presents for his initial diabetic visit. He has type 2 diabetes mellitus. Onset time: He was diagnosed at approximate age of 64 years . His disease course has been worsening. There are no hypoglycemic associated symptoms. Pertinent  negatives for hypoglycemia include no confusion, headaches, pallor or seizures. Associated symptoms include blurred vision, polydipsia and polyuria. Pertinent negatives for diabetes include no chest pain, no fatigue, no polyphagia and no weakness. There are no hypoglycemic complications. Symptoms are worsening. Diabetic complications include a CVA. Risk factors for coronary artery disease include dyslipidemia, diabetes mellitus, male sex and sedentary  lifestyle. Current diabetic treatments: He was supposed to be on multiple daily injections of insulin, however currently taking only metformin 1000 g p.o. twice daily. His weight is fluctuating minimally (He lost 10-15 pounds recently unintentionally.). He is following a generally unhealthy diet. When asked about meal planning, he reported none. (He did not bring any logs or meter.  Admittedly, he does not have a meter to monitor blood glucose with.  He is recent A1c was 12%.) An ACE inhibitor/angiotensin II receptor blocker is being taken.  Hyperlipidemia This is a chronic problem. The current episode started more than 1 year ago. Exacerbating diseases include diabetes. Pertinent negatives include no chest pain, myalgias or shortness of breath. Current antihyperlipidemic treatment includes statins. Risk factors for coronary artery disease include diabetes mellitus, dyslipidemia, hypertension, male sex and a sedentary lifestyle.  Hypertension This is a chronic problem. The current episode started more than 1 year ago. The problem is controlled. Associated symptoms include blurred vision. Pertinent negatives include no chest pain, headaches, neck pain, palpitations or shortness of breath. Risk factors for coronary artery disease include dyslipidemia, diabetes mellitus, male gender and sedentary lifestyle. Hypertensive end-organ damage includes CVA.    Review of Systems  Constitutional:  Negative for chills, fatigue, fever and unexpected weight change.  HENT:   Negative for dental problem, mouth sores and trouble swallowing.   Eyes:  Positive for blurred vision. Negative for visual disturbance.  Respiratory:  Negative for cough, choking, chest tightness, shortness of breath and wheezing.   Cardiovascular:  Negative for chest pain, palpitations and leg swelling.  Gastrointestinal:  Negative for abdominal distention, abdominal pain, constipation, diarrhea, nausea and vomiting.  Endocrine: Positive for polydipsia and polyuria. Negative for polyphagia.  Genitourinary:  Negative for dysuria, flank pain, hematuria and urgency.  Musculoskeletal:  Negative for back pain, gait problem, myalgias and neck pain.  Skin:  Negative for pallor, rash and wound.  Neurological:  Negative for seizures, syncope, weakness, numbness and headaches.  Psychiatric/Behavioral:  Negative for confusion and dysphoric mood.    Objective:    Vitals with BMI 11/12/2020 04/11/2020 10/30/2018  Height $Remov'5\' 11"'qnzuDS$  $RemoveB'5\' 11"'dhiIrbWG$  -  Weight 186 lbs 3 oz 195 lbs 1 oz -  BMI 16.10 96.04 -  Systolic 540 981 191  Diastolic 80 95 90  Pulse 80 75 58    BP 112/80   Pulse 80   Ht $R'5\' 11"'Rk$  (1.803 m)   Wt 186 lb 3.2 oz (84.5 kg)   BMI 25.97 kg/m   Wt Readings from Last 3 Encounters:  11/12/20 186 lb 3.2 oz (84.5 kg)  04/11/20 195 lb 1 oz (88.5 kg)  10/30/18 215 lb (97.5 kg)     Physical Exam Constitutional:      General: He is not in acute distress.    Appearance: He is well-developed.  HENT:     Head: Normocephalic and atraumatic.  Neck:     Thyroid: No thyromegaly.     Trachea: No tracheal deviation.  Cardiovascular:     Rate and Rhythm: Normal rate.     Pulses:          Dorsalis pedis pulses are 1+ on the right side and 1+ on the left side.       Posterior tibial pulses are 1+ on the right side and 1+ on the left side.     Heart sounds: Normal heart sounds, S1 normal and S2 normal. No murmur heard.   No gallop.  Pulmonary:     Effort: No  respiratory distress.     Breath sounds: Normal  breath sounds. No wheezing.  Abdominal:     General: Bowel sounds are normal. There is no distension.     Palpations: Abdomen is soft.     Tenderness: There is no abdominal tenderness. There is no guarding.  Musculoskeletal:     Right shoulder: No swelling or deformity.     Cervical back: Normal range of motion and neck supple.  Skin:    General: Skin is warm and dry.     Findings: No rash.     Nails: There is no clubbing.  Neurological:     Mental Status: He is alert and oriented to person, place, and time.     Cranial Nerves: No cranial nerve deficit.     Sensory: No sensory deficit.     Gait: Gait normal.     Deep Tendon Reflexes: Reflexes are normal and symmetric.  Psychiatric:        Speech: Speech normal.        Behavior: Behavior normal. Behavior is cooperative.        Thought Content: Thought content normal.        Judgment: Judgment normal.      CMP ( most recent) CMP     Component Value Date/Time   NA 138 07/21/2016 1042   K 3.8 07/21/2016 1042   CL 104 07/21/2016 1042   CO2 23 07/21/2016 1042   GLUCOSE 195 (H) 07/21/2016 1042   BUN 15 07/21/2016 1042   CREATININE 0.70 05/09/2020 1656   CREATININE 0.88 07/21/2016 1042   CALCIUM 9.2 07/21/2016 1042   PROT 6.5 07/21/2016 1042   ALBUMIN 4.0 07/21/2016 1042   AST 24 07/21/2016 1042   ALT 29 07/21/2016 1042   ALKPHOS 74 07/21/2016 1042   BILITOT 0.4 07/21/2016 1042   GFRNONAA >60 05/26/2016 1413   GFRAA >60 05/26/2016 1413     Diabetic Labs (most recent): Lab Results  Component Value Date   HGBA1C 12.0 10/10/2020      Lab Results  Component Value Date   TSH 1.82 06/16/2016      Assessment & Plan:   1. DM type 2 causing vascular disease ([place])   - [name] has currently uncontrolled symptomatic type 2 DM since  65 years of age,  with most recent A1c of 12 %. Recent labs reviewed.  He did not bring any logs or meter.  Admittedly, he does not have a meter to monitor blood glucose with.     - I had a long discussion with him about the progressive nature of diabetes and the pathology behind its complications. -his diabetes is complicated by CVA, severe persistent hyperglycemia, and he remains at a high risk for more acute and chronic complications which include CAD, CVA, CKD, retinopathy, and neuropathy. These are all discussed in detail with him.  - I have counseled him on diet  and weight management  by adopting a carbohydrate restricted/protein rich diet. Patient is encouraged to switch to  unprocessed or minimally processed     complex starch and increased protein intake (animal or plant source), fruits, and vegetables. -  he is advised to stick to a routine mealtimes to eat 3 meals  a day and avoid unnecessary snacks ( to snack only to correct hypoglycemia).   - he acknowledges that there is a room for improvement in his food and drink choices. - Suggestion is made for him to avoid simple carbohydrates  from his  diet including Cakes, Sweet Desserts, Ice Cream, Soda (diet and regular), Sweet Tea, Candies, Chips, Cookies, Store Bought Juices, Alcohol in Excess of  1-2 drinks a day, Artificial Sweeteners,  Coffee Creamer, and "Sugar-free" Products. This will help patient to have more stable blood glucose profile and potentially avoid unintended weight gain.  - he will be scheduled with Jearld Fenton, RDN, CDE for diabetes education.  - I have approached him with the following individualized plan to manage  his diabetes and patient agrees:   -In light of his presentation with persistent severe hyperglycemia, he will likely need multiple daily injections of insulin in order for him to achieve and maintain control of diabetes to target.  He was treated with his approach in the past. -In preparation, he is approached to start monitoring blood glucose 4 times a day-before meals and at bedtime and return in 10 days with his meter and logs. -In the meantime, I discussed and reinitiated  Lantus to 40 units nightly. - he is warned not to take insulin without proper monitoring per orders.  - he is encouraged to call clinic for blood glucose levels less than 70 or above 300 mg /dl. - he is advised to continue metformin 1000 mg p.o. twice daily, therapeutically suitable for patient . -He is not a suitable candidate for GLP-1 receptor agonists, nor SGLT2 inhibitors. - Specific targets for  A1c;  LDL, HDL,  and Triglycerides were discussed with the patient.  2) Blood Pressure /Hypertension:  his blood pressure is  controlled to target.   he is advised to continue his current medications including lisinopril 40 mg p.o. daily with breakfast . 3) Lipids/Hyperlipidemia:   Review of his recunent lipid panel showed uncontrolled triglycerides above 1000, LDL at 127. Insulin treatment will help with severe dyslipidemia, he is advised to continue    Crestor 40 mg p.o. nightly.  -Plant predominant, [employer] lifestyle nutrition was discussed with him.  He is advised to avoid trans-, saturated fats.  He will be considered for fenofibrate during subsequent visits.  4)  Weight/Diet:  Body mass index is 25.97 kg/m.  -     he is not a candidate for weight loss. I discussed with him the fact that loss of 5 - 10% of his  current body weight will have the most impact on his diabetes management.  Exercise, and detailed carbohydrates information provided  -  detailed on discharge instructions.  5) Chronic Care/Health Maintenance:  -he  is on ACEI/ARB and Statin medications and  is encouraged to initiate and continue to follow up with Ophthalmology, Dentist,  Podiatrist at least yearly or according to recommendations, and advised to   stay away from smoking. I have recommended yearly flu vaccine and pneumonia vaccine at least every 5 years; moderate intensity exercise for up to 150 minutes weekly; and  sleep for at least 7 hours a day.  - he is  advised to maintain close follow up with Joyice Faster, FNP for primary care needs, as well as his other providers for optimal and coordinated care.   I spent 66 minutes in the care of the patient today including review of labs from [facility], Lipids, Thyroid Function, Hematology (current and previous including abstractions from other facilities); face-to-face time discussing  his blood glucose readings/logs, discussing hypoglycemia and hyperglycemia episodes and symptoms, medications doses, his options of short and long term treatment based on the latest standards of care / guidelines;  discussion about incorporating lifestyle medicine;  and documenting the encounter.     Please refer to Patient Instructions for Blood Glucose Monitoring and Insulin/Medications Dosing Guide"  in media tab for additional information. Please  also refer to " Patient Self Inventory" in the Media  tab for reviewed elements of pertinent patient history.  [name] participated in the discussions, expressed understanding, and voiced agreement with the above plans.  All questions were answered to his satisfaction. he is encouraged to contact clinic should he have any questions or concerns prior to his return visit.   Follow up plan: - Return in about 10 days (around 11/22/2020) for F/U with Meter and Logs Only - no Labs.  Glade Lloyd, MD [facility] [address] [place],  [address] Phone: [phone]  Fax: [phone]    11/12/2020, 1:50 PM  This note was partially dictated with voice recognition software. Similar sounding words can be transcribed inadequately or may not  be corrected upon review.

## 2020-11-26 ENCOUNTER — Ambulatory Visit: Payer: Medicare HMO | Admitting: "Endocrinology

## 2020-12-10 ENCOUNTER — Other Ambulatory Visit: Payer: Self-pay

## 2020-12-10 ENCOUNTER — Ambulatory Visit (INDEPENDENT_AMBULATORY_CARE_PROVIDER_SITE_OTHER): Payer: Medicare HMO | Admitting: "Endocrinology

## 2020-12-10 ENCOUNTER — Encounter: Payer: Self-pay | Admitting: "Endocrinology

## 2020-12-10 VITALS — BP 116/76 | HR 84 | Ht 71.0 in | Wt 190.6 lb

## 2020-12-10 DIAGNOSIS — I10 Essential (primary) hypertension: Secondary | ICD-10-CM

## 2020-12-10 DIAGNOSIS — E1159 Type 2 diabetes mellitus with other circulatory complications: Secondary | ICD-10-CM

## 2020-12-10 DIAGNOSIS — E782 Mixed hyperlipidemia: Secondary | ICD-10-CM | POA: Diagnosis not present

## 2020-12-10 MED ORDER — FREESTYLE LIBRE 2 READER DEVI
0 refills | Status: DC
Start: 2020-12-10 — End: 2021-04-03

## 2020-12-10 MED ORDER — ACCU-CHEK GUIDE VI STRP: ORAL_STRIP | 2 refills | Status: DC

## 2020-12-10 MED ORDER — INSULIN ASPART 100 UNIT/ML FLEXPEN: 6.0000 [IU] | PEN_INJECTOR | Freq: Three times a day (TID) | SUBCUTANEOUS | 1 refills | Status: DC

## 2020-12-10 MED ORDER — FREESTYLE LIBRE 2 SENSOR MISC: 1.0000 | 3 refills | Status: DC

## 2020-12-10 MED ORDER — BD PEN NEEDLE NANO U/F 32G X 4 MM MISC: 1.0000 | Freq: Four times a day (QID) | 2 refills | Status: DC

## 2020-12-10 MED ORDER — INSULIN GLARGINE 100 UNIT/ML SOLOSTAR PEN: 50.0000 [IU] | PEN_INJECTOR | Freq: Every day | SUBCUTANEOUS | 1 refills | Status: DC

## 2020-12-10 NOTE — Patient Instructions (Signed)

## 2020-12-10 NOTE — Progress Notes (Signed)
Endocrinology Consult Note       12/10/2020, 6:34 PM   Subjective:    Patient ID: Dustin Roberts, male    DOB: [birth year].  Dustin Roberts is being seen in consultation for management of currently uncontrolled symptomatic diabetes requested by  Joyice Faster, FNP.   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   History of kidney stones    HOH (hard of hearing)    Hyperlipidemia    Hypertension    IDA (iron deficiency anemia)    Myocardial infarction ([place])    2008   Peptic ulcer disease    Seizures ([place])    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 knife surgery 2008   Sleep apnea    Stroke [facility])    Due to brain aneurysm    Past Surgical History:  Procedure Laterality Date   APPENDECTOMY     BIOPSY  05/27/2016   Procedure: BIOPSY;  Surgeon: Daneil Dolin, MD;  [facility];  Service: Endoscopy;;  Duodenal, esophagus   BRAIN SURGERY     aneurysm, age 14   CEREBRAL ANEURYSM REPAIR     Second procedure was gamma knife repair   COLONOSCOPY WITH PROPOFOL N/A 05/27/2016   Procedure: COLONOSCOPY WITH PROPOFOL;  Surgeon: Daneil Dolin, MD;  [facility];  Service: Endoscopy;  Laterality: N/A;  1230    ESOPHAGOGASTRODUODENOSCOPY (EGD) WITH PROPOFOL N/A 05/27/2016   Procedure: ESOPHAGOGASTRODUODENOSCOPY (EGD) WITH PROPOFOL;  Surgeon: Daneil Dolin, MD;  [facility];  Service: Endoscopy;  Laterality: N/A;   FRACTURE SURGERY     left wrist   GIVENS CAPSULE STUDY N/A 06/08/2016   Procedure: GIVENS CAPSULE STUDY;  Surgeon: Daneil Dolin, MD;  [facility];  Service: Endoscopy;  Laterality: N/A;  7:30am. Patient to arrive at  7:00am   [place]     left inguinal and umbilical   KNEE SURGERY Left    twice   POLYPECTOMY  05/27/2016   Procedure: POLYPECTOMY;  Surgeon: Daneil Dolin, MD;  [facility];  Service: Endoscopy;;  recto-sigmoid polyp   ROTATOR CUFF REPAIR Left    x2    Social History   Socioeconomic History   Marital status: Legally Separated    Spouse name: Not on file   Number of children: Not on file   Years of education: Not on file   Highest education level: Not on file  Occupational History   Not on file  Tobacco Use   Smoking status: Never   Smokeless tobacco: Never  Vaping Use   Vaping Use: Never used  Substance and Sexual Activity   Alcohol use: No   Drug use: No   Sexual activity: Yes    Birth control/protection: None  Other Topics Concern   Not on file  Social History Narrative  Not on file   Social Determinants of Health   Financial Resource Strain: Not on file  Food Insecurity: Not on file  Transportation Needs: Not on file  Physical Activity: Not on file  Stress: Not on file  Social Connections: Not on file    Family History  Problem Relation Age of Onset   Colon cancer Neg Hx     Outpatient Encounter Medications as of 12/10/2020  Medication Sig   Continuous Blood Gluc Receiver (FREESTYLE LIBRE 2 READER) DEVI As directed   Continuous Blood Gluc Sensor (FREESTYLE LIBRE 2 SENSOR) MISC 1 Piece by Does not apply route every 14 (fourteen) days.   Insulin Pen Needle (BD PEN NEEDLE NANO U/F) 32G X 4 MM MISC 1 each by Does not apply route 4 (four) times daily. To inject insulin  glucose 4 times a day   aspirin-acetaminophen-caffeine (EXCEDRIN MIGRAINE) 250-250-65 MG tablet Take 1 tablet by mouth every 6 (six) hours as needed for headache.   Blood Glucose Monitoring Suppl (ACCU-CHEK GUIDE ME) w/Device KIT 1 Piece by Does not apply route as directed.   docusate sodium (COLACE CLEAR) 50 MG capsule Take 1 capsule (50 mg total) by mouth daily as needed for  mild constipation. As needed for constipation associated with iron supplement (Patient not taking: Reported on 11/12/2020)   ferrous sulfate 325 (65 FE) MG EC tablet Take 1 tablet (325 mg total) by mouth daily with breakfast.   fluticasone-salmeterol (ADVAIR DISKUS) 250-50 MCG/ACT AEPB Place 1 puff into the nose in the morning and at bedtime.   gabapentin (NEURONTIN) 300 MG capsule Take 300 mg by mouth 2 (two) times daily.   glucose blood (ACCU-CHEK GUIDE) test strip Use  to monitor glucose 4 times a day   hydrochlorothiazide (MICROZIDE) 12.5 MG capsule Take 12.5 mg by mouth daily.   hydrocortisone (ANUSOL-HC) 2.5 % rectal cream Place 1 application rectally 2 (two) times daily. For 10 days. (Patient not taking: Reported on 12/10/2020)   insulin aspart (NOVOLOG) 100 UNIT/ML FlexPen Inject 6-12 Units into the skin 3 (three) times daily before meals.   insulin glargine (LANTUS) 100 UNIT/ML Solostar Pen Inject 50 Units into the skin at bedtime.   lisinopril (PRINIVIL,ZESTRIL) 40 MG tablet Take 40 mg by mouth every evening.    loratadine (CLARITIN) 10 MG tablet Take 10 mg by mouth every evening.  (Patient not taking: Reported on 11/12/2020)   metFORMIN (GLUCOPHAGE) 1000 MG tablet Take 1,000 mg by mouth 2 (two) times daily.   ondansetron (ZOFRAN) 4 MG tablet TAKE 1 TABLET BY MOUTH EVERY 4 HOURS AS NEEDED FOR NAUSEA/ VOMITING   pantoprazole (PROTONIX) 40 MG tablet Take 40 mg by mouth every evening.    rosuvastatin (CRESTOR) 40 MG tablet Take 40 mg by mouth daily.   [DISCONTINUED] glucose blood (ACCU-CHEK GUIDE) test strip Use  to monitor glucose 4 times a day   [DISCONTINUED] insulin aspart (NOVOLOG) 100 UNIT/ML FlexPen Inject 10 Units into the skin 3 (three) times daily before meals. (Patient not taking: Reported on 11/12/2020)   [DISCONTINUED] insulin glargine (LANTUS) 100 UNIT/ML Solostar Pen Inject 40 Units into the skin at bedtime.   No facility-administered encounter medications on file as of  12/10/2020.    ALLERGIES: Allergies  Allergen Reactions   Morphine And Related     hallucinations    VACCINATION STATUS:  There is no immunization history on file for this patient.  Diabetes He presents for his follow-up diabetic visit. He has type 2 diabetes mellitus. Onset time:  He was diagnosed at approximate age of 62 years . His disease course has been worsening. There are no hypoglycemic associated symptoms. Pertinent negatives for hypoglycemia include no confusion, headaches, pallor or seizures. Associated symptoms include blurred vision, polydipsia and polyuria. Pertinent negatives for diabetes include no chest pain, no fatigue, no polyphagia and no weakness. There are no hypoglycemic complications. Symptoms are worsening. Diabetic complications include a CVA. Risk factors for coronary artery disease include dyslipidemia, diabetes mellitus, male sex and sedentary lifestyle. Current diabetic treatments: He was supposed to be on multiple daily injections of insulin, however currently taking only metformin 1000 g p.o. twice daily. His weight is fluctuating minimally (He lost 10-15 pounds recently unintentionally.). He is following a generally unhealthy diet. When asked about meal planning, he reported none. His home blood glucose trend is increasing steadily. His breakfast blood glucose range is generally >200 mg/dl. His lunch blood glucose range is generally >200 mg/dl. His dinner blood glucose range is generally >200 mg/dl. His bedtime blood glucose range is generally >200 mg/dl. His overall blood glucose range is >200 mg/dl. Jenny Reichmann presents with his logs showing persistently above target glycemic readings averaging more than 200 mg..  His recent A1c was 12%.  ) An ACE inhibitor/angiotensin II receptor blocker is being taken.  Hyperlipidemia This is a chronic problem. The current episode started more than 1 year ago. Exacerbating diseases include diabetes. Pertinent negatives include no chest  pain, myalgias or shortness of breath. Current antihyperlipidemic treatment includes statins. Risk factors for coronary artery disease include diabetes mellitus, dyslipidemia, hypertension, male sex and a sedentary lifestyle.  Hypertension This is a chronic problem. The current episode started more than 1 year ago. The problem is controlled. Associated symptoms include blurred vision. Pertinent negatives include no chest pain, headaches, neck pain, palpitations or shortness of breath. Risk factors for coronary artery disease include dyslipidemia, diabetes mellitus, male gender and sedentary lifestyle. Hypertensive end-organ damage includes CVA.    Review of Systems  Constitutional:  Negative for chills, fatigue, fever and unexpected weight change.  HENT:  Negative for dental problem, mouth sores and trouble swallowing.   Eyes:  Positive for blurred vision. Negative for visual disturbance.  Respiratory:  Negative for cough, choking, chest tightness, shortness of breath and wheezing.   Cardiovascular:  Negative for chest pain, palpitations and leg swelling.  Gastrointestinal:  Negative for abdominal distention, abdominal pain, constipation, diarrhea, nausea and vomiting.  Endocrine: Positive for polydipsia and polyuria. Negative for polyphagia.  Genitourinary:  Negative for dysuria, flank pain, hematuria and urgency.  Musculoskeletal:  Negative for back pain, gait problem, myalgias and neck pain.  Skin:  Negative for pallor, rash and wound.  Neurological:  Negative for seizures, syncope, weakness, numbness and headaches.  Psychiatric/Behavioral:  Negative for confusion and dysphoric mood.    Objective:    Vitals with BMI 12/10/2020 11/12/2020 04/11/2020  Height $Remov'5\' 11"'OKIVPj$  $RemoveB'5\' 11"'InxMjMcQ$  $RemoveBef'5\' 11"'pykPgKXYZL$   Weight 190 lbs 10 oz 186 lbs 3 oz 195 lbs 1 oz  BMI 26.6 47.42 59.56  Systolic 387 564 332  Diastolic 76 80 95  Pulse 84 80 75    BP 116/76   Pulse 84   Ht $R'5\' 11"'Lb$  (1.803 m)   Wt 190 lb 9.6 oz (86.5 kg)   BMI  26.58 kg/m   Wt Readings from Last 3 Encounters:  12/10/20 190 lb 9.6 oz (86.5 kg)  11/12/20 186 lb 3.2 oz (84.5 kg)  04/11/20 195 lb 1 oz (88.5 kg)     Physical  Exam Constitutional:      General: He is not in acute distress.    Appearance: He is well-developed.  HENT:     Head: Normocephalic and atraumatic.  Neck:     Thyroid: No thyromegaly.     Trachea: No tracheal deviation.  Cardiovascular:     Rate and Rhythm: Normal rate.     Pulses:          Dorsalis pedis pulses are 1+ on the right side and 1+ on the left side.       Posterior tibial pulses are 1+ on the right side and 1+ on the left side.     Heart sounds: Normal heart sounds, S1 normal and S2 normal. No murmur heard.   No gallop.  Pulmonary:     Effort: No respiratory distress.     Breath sounds: Normal breath sounds. No wheezing.  Abdominal:     General: Bowel sounds are normal. There is no distension.     Palpations: Abdomen is soft.     Tenderness: There is no abdominal tenderness. There is no guarding.  Musculoskeletal:     Right shoulder: No swelling or deformity.     Cervical back: Normal range of motion and neck supple.  Skin:    General: Skin is warm and dry.     Findings: No rash.     Nails: There is no clubbing.  Neurological:     Mental Status: He is alert and oriented to person, place, and time.     Cranial Nerves: No cranial nerve deficit.     Sensory: No sensory deficit.     Gait: Gait normal.     Deep Tendon Reflexes: Reflexes are normal and symmetric.  Psychiatric:        Speech: Speech normal.        Behavior: Behavior normal. Behavior is cooperative.        Thought Content: Thought content normal.        Judgment: Judgment normal.      CMP ( most recent) CMP     Component Value Date/Time   NA 138 07/21/2016 1042   K 3.8 07/21/2016 1042   CL 104 07/21/2016 1042   CO2 23 07/21/2016 1042   GLUCOSE 195 (H) 07/21/2016 1042   BUN 15 07/21/2016 1042   CREATININE 0.70 05/09/2020 1656    CREATININE 0.88 07/21/2016 1042   CALCIUM 9.2 07/21/2016 1042   PROT 6.5 07/21/2016 1042   ALBUMIN 4.0 07/21/2016 1042   AST 24 07/21/2016 1042   ALT 29 07/21/2016 1042   ALKPHOS 74 07/21/2016 1042   BILITOT 0.4 07/21/2016 1042   GFRNONAA >60 05/26/2016 1413   GFRAA >60 05/26/2016 1413     Diabetic Labs (most recent): Lab Results  Component Value Date   HGBA1C 12.0 10/10/2020      Lab Results  Component Value Date   TSH 1.82 06/16/2016      Assessment & Plan:   1. DM type 2 causing vascular disease ([place])   - Dustin Roberts has currently uncontrolled symptomatic type 2 DM since  65 years of age.  Mitesh presents with his logs showing persistently above target glycemic readings averaging more than 200 mg..  His recent A1c was 12%.    Recent labs reviewed.  He did not bring any logs or meter.  Admittedly, he does not have a meter to monitor blood glucose with.    - I had a long discussion with him about the progressive  nature of diabetes and the pathology behind its complications. -his diabetes is complicated by CVA, severe persistent hyperglycemia, and he remains at a high risk for more acute and chronic complications which include CAD, CVA, CKD, retinopathy, and neuropathy. These are all discussed in detail with him.  - I have counseled him on diet  and weight management  by adopting a carbohydrate restricted/protein rich diet. Patient is encouraged to switch to  unprocessed or minimally processed     complex starch and increased protein intake (animal or plant source), fruits, and vegetables. -  he is advised to stick to a routine mealtimes to eat 3 meals  a day and avoid unnecessary snacks ( to snack only to correct hypoglycemia).    - he acknowledges that there is a room for improvement in his food and drink choices. - Suggestion is made for him to avoid simple carbohydrates  from his diet including Cakes, Sweet Desserts, Ice Cream, Soda (diet and regular), Sweet  Tea, Candies, Chips, Cookies, Store Bought Juices, Alcohol in Excess of  1-2 drinks a day, Artificial Sweeteners,  Coffee Creamer, and "Sugar-free" Products, Lemonade. This will help patient to have more stable blood glucose profile and potentially avoid unintended weight gain.   - he will be scheduled with Jearld Fenton, RDN, CDE for diabetes education.  - I have approached him with the following individualized plan to manage  his diabetes and patient agrees:   -In light of his presentation with persistent severe hyperglycemia, he will  need multiple daily injections of insulin in order for him to achieve and maintain control of diabetes to target.   Accordingly, he is advised to increase his Lantus to 50 units nightly, resume his NovoLog at 6 units 3 times daily AC for blood glucose readings between 90 to 150 mg/day, and additional correction dose for blood glucose readings above 150 mg per DL.  For this to happen he has to continue to monitor blood glucose 4 times a day.  These are before meals and at bedtime.  This patient would benefit from a CGM.  I discussed and prescribed the freestyle libre device for him.  - he is warned not to take insulin without proper monitoring per orders.  - he is encouraged to call clinic for blood glucose levels less than 70 or above 200 mg /dl. - he is advised to continue metformin 1000 mg p.o. twice daily, therapeutically suitable for patient . -He is not a suitable candidate for GLP-1 receptor agonists, nor SGLT2 inhibitors. - Specific targets for  A1c;  LDL, HDL,  and Triglycerides were discussed with the patient.  2) Blood Pressure /Hypertension: His blood pressure is controlled to target.   he is advised to continue his current medications including lisinopril 40 mg p.o. daily with breakfast .  3) Lipids/Hyperlipidemia:   Review of his recunent lipid panel showed uncontrolled triglycerides above 1000, LDL at 127. Insulin treatment will help with severe  dyslipidemia, he is advised to continue Crestor 40 mg p.o. nightly.  -Plant predominant, [employer] lifestyle nutrition was discussed with him.  He is advised to avoid trans-, saturated fats.  He will be considered for fenofibrate during subsequent visits.  4)  Weight/Diet:  Body mass index is 26.58 kg/m.  -     he is not a candidate for weight loss. I discussed with him the fact that loss of 5 - 10% of his  current body weight will have the most impact on his diabetes management.  Exercise, and detailed carbohydrates information provided  -  detailed on discharge instructions.  5) Chronic Care/Health Maintenance:  -he  is on ACEI/ARB and Statin medications and  is encouraged to initiate and continue to follow up with Ophthalmology, Dentist,  Podiatrist at least yearly or according to recommendations, and advised to   stay away from smoking. I have recommended yearly flu vaccine and pneumonia vaccine at least every 5 years; moderate intensity exercise for up to 150 minutes weekly; and  sleep for at least 7 hours a day.  - he is  advised to maintain close follow up with Joyice Faster, FNP for primary care needs, as well as his other providers for optimal and coordinated care.   I spent 42 minutes in the care of the patient today including review of labs from [place], Lipids, Thyroid Function, Hematology (current and previous including abstractions from other facilities); face-to-face time discussing  his blood glucose readings/logs, discussing hypoglycemia and hyperglycemia episodes and symptoms, medications doses, his options of short and long term treatment based on the latest standards of care / guidelines;  discussion about incorporating lifestyle medicine;  and documenting the encounter.    Please refer to Patient Instructions for Blood Glucose Monitoring and Insulin/Medications Dosing Guide"  in media tab for additional information. Please  also refer to " Patient Self Inventory" in the Media   tab for reviewed elements of pertinent patient history.  Dustin Roberts participated in the discussions, expressed understanding, and voiced agreement with the above plans.  All questions were answered to his satisfaction. he is encouraged to contact clinic should he have any questions or concerns prior to his return visit.    Follow up plan: - Return in about 3 months (around 03/10/2021) for F/U with Pre-visit Labs, Meter, Logs, A1c here.Glade Lloyd, MD [facility] [address] [place], [place] [address] Phone: [phone]  Fax: [phone]    12/10/2020, 6:34 PM  This note was partially dictated with voice recognition software. Similar sounding words can be transcribed inadequately or may not  be corrected upon review.

## 2021-03-11 ENCOUNTER — Ambulatory Visit: Payer: Medicare HMO | Admitting: "Endocrinology

## 2021-03-18 LAB — LIPID PANEL
Chol/HDL Ratio: 4.3 ratio (ref 0.0–5.0)
Cholesterol, Total: 169 mg/dL (ref 100–199)
HDL: 39 mg/dL — ABNORMAL LOW (ref >39)
LDL Chol Calc (NIH): 105 mg/dL — ABNORMAL HIGH (ref 0–99)
Triglycerides: 142 mg/dL (ref 0–149)
VLDL Cholesterol Cal: 25 mg/dL (ref 5–40)

## 2021-03-18 LAB — COMPREHENSIVE METABOLIC PANEL
ALT: 25 IU/L (ref 0–44)
AST: 20 IU/L (ref 0–40)
Albumin/Globulin Ratio: 1.8 (ref 1.2–2.2)
Albumin: 4.4 g/dL (ref 3.8–4.8)
Alkaline Phosphatase: 83 IU/L (ref 44–121)
BUN/Creatinine Ratio: 17 (ref 10–24)
BUN: 16 mg/dL (ref 8–27)
Bilirubin Total: 0.5 mg/dL (ref 0.0–1.2)
CO2: 26 mmol/L (ref 20–29)
Calcium: 9.5 mg/dL (ref 8.6–10.2)
Chloride: 101 mmol/L (ref 96–106)
Creatinine, Ser: 0.93 mg/dL (ref 0.76–1.27)
Globulin, Total: 2.4 g/dL (ref 1.5–4.5)
Glucose: 218 mg/dL — ABNORMAL HIGH (ref 70–99)
Potassium: 4.2 mmol/L (ref 3.5–5.2)
Sodium: 139 mmol/L (ref 134–144)
Total Protein: 6.8 g/dL (ref 6.0–8.5)
eGFR: 90 mL/min/{1.73_m2} (ref >59)

## 2021-03-18 LAB — T4, FREE: Free T4: 1.42 ng/dL (ref 0.82–1.77)

## 2021-03-18 LAB — TSH: TSH: 1.79 u[IU]/mL (ref 0.450–4.500)

## 2021-03-20 ENCOUNTER — Ambulatory Visit: Payer: Medicare HMO | Admitting: "Endocrinology

## 2021-03-25 ENCOUNTER — Ambulatory Visit: Payer: Medicare HMO | Admitting: "Endocrinology

## 2021-04-02 ENCOUNTER — Ambulatory Visit: Payer: Medicare HMO | Admitting: "Endocrinology

## 2021-04-03 ENCOUNTER — Encounter: Payer: Self-pay | Admitting: "Endocrinology

## 2021-04-03 ENCOUNTER — Ambulatory Visit (INDEPENDENT_AMBULATORY_CARE_PROVIDER_SITE_OTHER): Payer: Medicare HMO | Admitting: "Endocrinology

## 2021-04-03 VITALS — BP 120/82 | HR 64 | Ht 71.0 in | Wt 195.2 lb

## 2021-04-03 DIAGNOSIS — E782 Mixed hyperlipidemia: Secondary | ICD-10-CM

## 2021-04-03 DIAGNOSIS — I10 Essential (primary) hypertension: Secondary | ICD-10-CM

## 2021-04-03 DIAGNOSIS — E1159 Type 2 diabetes mellitus with other circulatory complications: Secondary | ICD-10-CM | POA: Diagnosis not present

## 2021-04-03 LAB — POCT GLYCOSYLATED HEMOGLOBIN (HGB A1C): HbA1c, POC (controlled diabetic range): 9.8 % — AB (ref 0.0–7.0)

## 2021-04-03 MED ORDER — INSULIN GLARGINE 100 UNIT/ML SOLOSTAR PEN: 50.0000 [IU] | PEN_INJECTOR | Freq: Every day | SUBCUTANEOUS | 1 refills | Status: DC

## 2021-04-03 MED ORDER — DEXCOM G7 SENSOR MISC: 2 refills | Status: DC

## 2021-04-03 MED ORDER — DEXCOM G7 RECEIVER DEVI: 0 refills | Status: DC

## 2021-04-03 NOTE — Patient Instructions (Signed)

## 2021-04-03 NOTE — Progress Notes (Signed)
? ?                                                             Endocrinology Consult Note  ?     04/03/2021, 10:16 AM ? ? ?Subjective:  ? ? Patient ID: Dustin Roberts, male    DOB: Aug 14, 1955.  ?Dustin Roberts is being seen in consultation for management of currently uncontrolled symptomatic diabetes requested by  Joyice Faster, FNP. ? ? ?Past Medical History:  ?Diagnosis Date  ? Arthritis   ? CHF (congestive heart failure) ([place])   ? COPD (chronic obstructive pulmonary disease) ([place])   ? Diabetes mellitus   ? GERD (gastroesophageal reflux disease)   ? Headache   ? 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 History of kidney stones   ? HOH (hard of hearing)   ? Hyperlipidemia   ? Hypertension   ? IDA (iron deficiency anemia)   ? Myocardial infarction [facility])   ? 2008  ? Peptic ulcer disease   ? Seizures ([place])   ? 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 knife surgery 2008  ? Sleep apnea   ? Stroke [facility])   ? Due to brain aneurysm  ? ? ?Past Surgical History:  ?Procedure Laterality Date  ? APPENDECTOMY    ? BIOPSY  05/27/2016  ? Procedure: BIOPSY;  Surgeon: Daneil Dolin, MD;  [facility];  Service: Endoscopy;;  Duodenal, esophagus  ? BRAIN SURGERY    ? aneurysm, age 36  ? CEREBRAL ANEURYSM REPAIR    ? Second procedure was gamma knife repair  ? COLONOSCOPY WITH PROPOFOL N/A 05/27/2016  ? Procedure: COLONOSCOPY WITH PROPOFOL;  Surgeon: Daneil Dolin, MD;  [facility]: [facility];  Service: Endoscopy;  Laterality: N/A;  1230 ?  ? ESOPHAGOGASTRODUODENOSCOPY (EGD) WITH PROPOFOL N/A 05/27/2016  ? Procedure: ESOPHAGOGASTRODUODENOSCOPY (EGD) WITH PROPOFOL;  Surgeon: Daneil Dolin, MD;  [facility];  Service: Endoscopy;  Laterality: N/A;  ? FRACTURE SURGERY    ? left wrist  ? GIVENS CAPSULE STUDY N/A 06/08/2016  ? Procedure: GIVENS CAPSULE STUDY;  Surgeon: Daneil Dolin, MD;  [facility];  Service: Endoscopy;  Laterality: N/A;  7:30am. Patient to arrive at  7:00am  ? HERNIA REPAIR    ? left inguinal and umbilical  ? KNEE SURGERY Left   ? twice  ? POLYPECTOMY  05/27/2016  ? Procedure: POLYPECTOMY;  Surgeon: Daneil Dolin, MD;  [facility]: [facility];  Service: Endoscopy;;  recto-sigmoid polyp  ? ROTATOR CUFF REPAIR Left   ? x2  ? ? ?Social History  ? ?Socioeconomic History  ? Marital status: Legally Separated  ?  Spouse name: Not on file  ? Number of children: Not on file  ? Years of education: Not on file  ? Highest education level: Not on file  ?Occupational History  ? Not on file  ?Tobacco Use  ? Smoking status: Never  ? Smokeless tobacco: Never  ?Vaping Use  ? Vaping Use: Never used  ?Substance and Sexual Activity  ? Alcohol use: No  ? Drug use: No  ? Sexual activity: Yes  ?  Birth control/protection: None  ?Other Topics Concern  ? Not on file  ?Social History Narrative  ?  Not on file  ? ?Social Determinants of Health  ? ?Financial Resource Strain: Not on file  ?Food Insecurity: Not on file  ?Transportation Needs: Not on file  ?Physical Activity: Not on file  ?Stress: Not on file  ?Social Connections: Not on file  ? ? ?Family History  ?Problem Relation Age of Onset  ? Colon cancer Neg Hx   ? ? ?Outpatient Encounter Medications as of 04/03/2021  ?Medication Sig  ? Continuous Blood Gluc Receiver (DEXCOM G7 RECEIVER) DEVI Use to monitor BG continuously  ? Continuous Blood Gluc Sensor (DEXCOM G7 SENSOR) MISC Change sensor every 10 days  ? aspirin-acetaminophen-caffeine (EXCEDRIN MIGRAINE) 250-250-65 MG tablet Take 1 tablet by mouth every 6 (six) hours as needed for headache.  ? Blood Glucose Monitoring Suppl (ACCU-CHEK GUIDE ME) w/Device KIT 1 Piece by Does not apply route as directed.  ? docusate sodium (COLACE CLEAR) 50 MG capsule Take 1 capsule (50 mg total) by mouth daily as needed for mild constipation. As needed for constipation associated with iron supplement (Patient not taking: Reported on 11/12/2020)  ? ferrous sulfate 325 (65 FE) MG EC tablet Take 1 tablet  (325 mg total) by mouth daily with breakfast.  ? fluticasone-salmeterol (ADVAIR DISKUS) 250-50 MCG/ACT AEPB Place 1 puff into the nose in the morning and at bedtime.  ? gabapentin (NEURONTIN) 300 MG capsule Take 300 mg by mouth 2 (two) times daily.  ? glucose blood (ACCU-CHEK GUIDE) test strip Use  to monitor glucose 4 times a day  ? hydrochlorothiazide (MICROZIDE) 12.5 MG capsule Take 12.5 mg by mouth daily.  ? hydrocortisone (ANUSOL-HC) 2.5 % rectal cream Place 1 application rectally 2 (two) times daily. For 10 days. (Patient not taking: Reported on 12/10/2020)  ? insulin aspart (NOVOLOG) 100 UNIT/ML FlexPen Inject 6-12 Units into the skin 3 (three) times daily before meals.  ? insulin glargine (LANTUS) 100 UNIT/ML Solostar Pen Inject 50 Units into the skin at bedtime.  ? Insulin Pen Needle (BD PEN NEEDLE NANO U/F) 32G X 4 MM MISC 1 each by Does not apply route 4 (four) times daily. To inject insulin  glucose 4 times a day  ? lisinopril (PRINIVIL,ZESTRIL) 40 MG tablet Take 40 mg by mouth every evening.   ? loratadine (CLARITIN) 10 MG tablet Take 10 mg by mouth every evening.  (Patient not taking: Reported on 11/12/2020)  ? metFORMIN (GLUCOPHAGE) 1000 MG tablet Take 1,000 mg by mouth 2 (two) times daily.  ? ondansetron (ZOFRAN) 4 MG tablet TAKE 1 TABLET BY MOUTH EVERY 4 HOURS AS NEEDED FOR NAUSEA/ VOMITING  ? pantoprazole (PROTONIX) 40 MG tablet Take 40 mg by mouth every evening.   ? rosuvastatin (CRESTOR) 40 MG tablet Take 40 mg by mouth daily.  ? [DISCONTINUED] Continuous Blood Gluc Receiver (FREESTYLE LIBRE 2 READER) DEVI As directed  ? [DISCONTINUED] Continuous Blood Gluc Sensor (FREESTYLE LIBRE 2 SENSOR) MISC 1 Piece by Does not apply route every 14 (fourteen) days.  ? [DISCONTINUED] insulin glargine (LANTUS) 100 UNIT/ML Solostar Pen Inject 50 Units into the skin at bedtime. (Patient taking differently: Inject 40 Units into the skin at bedtime.)  ? ?No facility-administered encounter medications on file as of  04/03/2021.  ? ? ?ALLERGIES: ?Allergies  ?Allergen Reactions  ? Morphine And Related   ?  hallucinations  ? ? ?VACCINATION STATUS: ? ?There is no immunization history on file for this patient. ? ?Diabetes ?He presents for his follow-up diabetic visit. He has type 2 diabetes mellitus. Onset time: He was  diagnosed at approximate age of 50 years . His disease course has been improving. There are no hypoglycemic associated symptoms. Pertinent negatives for hypoglycemia include no confusion, headaches, pallor or seizures. Associated symptoms include blurred vision, polydipsia and polyuria. Pertinent negatives for diabetes include no chest pain, no fatigue, no polyphagia and no weakness. There are no hypoglycemic complications. Symptoms are improving. Diabetic complications include a CVA. Risk factors for coronary artery disease include dyslipidemia, diabetes mellitus, male sex and sedentary lifestyle. Current diabetic treatments: He was supposed to be on multiple daily injections of insulin, however currently taking only metformin 1000 g p.o. twice daily. His weight is increasing steadily (He lost 10-15 pounds recently unintentionally.). He is following a generally unhealthy diet. When asked about meal planning, he reported none. His home blood glucose trend is decreasing steadily. His breakfast blood glucose range is generally >200 mg/dl. His lunch blood glucose range is generally >200 mg/dl. His dinner blood glucose range is generally >200 mg/dl. His bedtime blood glucose range is generally >200 mg/dl. His overall blood glucose range is >200 mg/dl. Dustin Roberts presents with his meter showing inadequate monitoring.  His average blood glucose is 254 for the last 30 days that has only 38 readings.  His point-of-care A1c is 9.8% improving from 12+ percent.  No documented or reported hypoglycemia.  He has lowered his Lantus to 40 units for unclear reasons.   ?) An ACE inhibitor/angiotensin II receptor blocker is being taken.   ?Hyperlipidemia ?This is a chronic problem. The current episode started more than 1 year ago. Exacerbating diseases include diabetes. Pertinent negatives include no chest pain, myalgias or shortness of breath. Cu

## 2021-06-19 ENCOUNTER — Other Ambulatory Visit: Payer: Self-pay | Admitting: "Endocrinology

## 2021-07-09 ENCOUNTER — Encounter: Payer: Self-pay | Admitting: "Endocrinology

## 2021-07-09 ENCOUNTER — Ambulatory Visit (INDEPENDENT_AMBULATORY_CARE_PROVIDER_SITE_OTHER): Payer: Medicare HMO | Admitting: "Endocrinology

## 2021-07-09 ENCOUNTER — Ambulatory Visit: Payer: Medicare HMO | Admitting: "Endocrinology

## 2021-07-09 VITALS — BP 142/86 | HR 80 | Ht 71.0 in | Wt 193.0 lb

## 2021-07-09 DIAGNOSIS — Z91199 Patient's noncompliance with other medical treatment and regimen due to unspecified reason: Secondary | ICD-10-CM | POA: Insufficient documentation

## 2021-07-09 DIAGNOSIS — I10 Essential (primary) hypertension: Secondary | ICD-10-CM

## 2021-07-09 DIAGNOSIS — E1159 Type 2 diabetes mellitus with other circulatory complications: Secondary | ICD-10-CM | POA: Diagnosis not present

## 2021-07-09 DIAGNOSIS — E782 Mixed hyperlipidemia: Secondary | ICD-10-CM

## 2021-07-09 LAB — POCT GLYCOSYLATED HEMOGLOBIN (HGB A1C): HbA1c, POC (controlled diabetic range): 8.1 % — AB (ref 0.0–7.0)

## 2021-07-09 NOTE — Progress Notes (Signed)
07/09/2021, 2:36 PM   Endocrinology follow-up note  Subjective:    Patient ID: Dustin Roberts, male    DOB: 04/29/1955.  Dustin Roberts is being seen in follow-up after he was seen in consultation for management of currently uncontrolled symptomatic diabetes requested by  Joyice Faster, FNP.   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   History of kidney stones    HOH (hard of hearing)    Hyperlipidemia    Hypertension    IDA (iron deficiency anemia)    Myocardial infarction ([place])    2008   Peptic ulcer disease    Seizures ([place])    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 knife surgery 2008   Sleep apnea    Stroke [facility])    Due to brain aneurysm    Past Surgical History:  Procedure Laterality Date   APPENDECTOMY     BIOPSY  05/27/2016   Procedure: BIOPSY;  Surgeon: Daneil Dolin, MD;  [facility];  Service: Endoscopy;;  Duodenal, esophagus   BRAIN SURGERY     aneurysm, age 46   CEREBRAL ANEURYSM REPAIR     Second procedure was gamma knife repair   COLONOSCOPY WITH PROPOFOL N/A 05/27/2016   Procedure: COLONOSCOPY WITH PROPOFOL;  Surgeon: Daneil Dolin, MD;  [facility];  Service: Endoscopy;  Laterality: N/A;  1230    ESOPHAGOGASTRODUODENOSCOPY (EGD) WITH PROPOFOL N/A 05/27/2016   Procedure: ESOPHAGOGASTRODUODENOSCOPY (EGD) WITH PROPOFOL;  Surgeon: Daneil Dolin, MD;  [facility];  Service: Endoscopy;  Laterality: N/A;   FRACTURE SURGERY     left wrist   GIVENS CAPSULE STUDY N/A 06/08/2016   Procedure: GIVENS CAPSULE STUDY;  Surgeon: Daneil Dolin, MD;  [facility];  Service: Endoscopy;  Laterality: N/A;   7:30am. Patient to arrive at 7:00am   [place]     left inguinal and umbilical   KNEE SURGERY Left    twice   POLYPECTOMY  05/27/2016   Procedure: POLYPECTOMY;  Surgeon: Daneil Dolin, MD;  [facility];  Service: Endoscopy;;  recto-sigmoid polyp   ROTATOR CUFF REPAIR Left    x2    Social History   Socioeconomic History   Marital status: Legally Separated    Spouse name: Not on file   Number of children: Not on file   Years of education: Not on file   Highest education level: Not on file  Occupational History   Not on file  Tobacco Use   Smoking status: Never   Smokeless tobacco: Never  Vaping Use   Vaping Use: Never used  Substance and Sexual Activity   Alcohol use: No   Drug use: No   Sexual activity: Yes    Birth control/protection: None  Other Topics Concern   Not on file  Social History  Narrative   Not on file   Social Determinants of Health   Financial Resource Strain: Not on file  Food Insecurity: Not on file  Transportation Needs: Not on file  Physical Activity: Not on file  Stress: Not on file  Social Connections: Not on file    Family History  Problem Relation Age of Onset   Colon cancer Neg Hx     Outpatient Encounter Medications as of 07/09/2021  Medication Sig   aspirin-acetaminophen-caffeine (EXCEDRIN MIGRAINE) 250-250-65 MG tablet Take 1 tablet by mouth every 6 (six) hours as needed for headache.   Blood Glucose Monitoring Suppl (ACCU-CHEK GUIDE ME) w/Device KIT 1 Piece by Does not apply route as directed.   Continuous Blood Gluc Receiver (DEXCOM G7 RECEIVER) DEVI Use to monitor BG continuously   Continuous Blood Gluc Sensor (DEXCOM G7 SENSOR) MISC Change sensor every 10 days   docusate sodium (COLACE CLEAR) 50 MG capsule Take 1 capsule (50 mg total) by mouth daily as needed for mild constipation. As needed for constipation associated with iron supplement (Patient not taking: Reported on 11/12/2020)   ferrous sulfate 325 (65 FE)  MG EC tablet Take 1 tablet (325 mg total) by mouth daily with breakfast.   fluticasone-salmeterol (ADVAIR DISKUS) 250-50 MCG/ACT AEPB Place 1 puff into the nose in the morning and at bedtime.   gabapentin (NEURONTIN) 300 MG capsule Take 300 mg by mouth 2 (two) times daily.   glucose blood (ACCU-CHEK GUIDE) test strip USE TO MONITOR GLUCOSE 4 TIMES DAILY.   hydrochlorothiazide (MICROZIDE) 12.5 MG capsule Take 12.5 mg by mouth daily.   hydrocortisone (ANUSOL-HC) 2.5 % rectal cream Place 1 application rectally 2 (two) times daily. For 10 days. (Patient not taking: Reported on 12/10/2020)   insulin aspart (NOVOLOG) 100 UNIT/ML FlexPen Inject 6-12 Units into the skin 3 (three) times daily before meals.   Insulin Glargine Solostar (LANTUS) 100 UNIT/ML Solostar Pen Inject 50 Units into the skin at bedtime.   Insulin Pen Needle (BD PEN NEEDLE NANO U/F) 32G X 4 MM MISC 1 each by Does not apply route 4 (four) times daily. To inject insulin  glucose 4 times a day   lisinopril (PRINIVIL,ZESTRIL) 40 MG tablet Take 40 mg by mouth every evening.    loratadine (CLARITIN) 10 MG tablet Take 10 mg by mouth every evening.  (Patient not taking: Reported on 11/12/2020)   metFORMIN (GLUCOPHAGE) 1000 MG tablet Take 1,000 mg by mouth 2 (two) times daily.   ondansetron (ZOFRAN) 4 MG tablet TAKE 1 TABLET BY MOUTH EVERY 4 HOURS AS NEEDED FOR NAUSEA/ VOMITING   pantoprazole (PROTONIX) 40 MG tablet Take 40 mg by mouth every evening.    rosuvastatin (CRESTOR) 40 MG tablet Take 40 mg by mouth daily.   No facility-administered encounter medications on file as of 07/09/2021.    ALLERGIES: Allergies  Allergen Reactions   Morphine And Related     hallucinations    VACCINATION STATUS:  There is no immunization history on file for this patient.  Diabetes He presents for his follow-up diabetic visit. He has type 2 diabetes mellitus. Onset time: He was diagnosed at approximate age of 21 years . His disease course has been  improving. There are no hypoglycemic associated symptoms. Pertinent negatives for hypoglycemia include no confusion, headaches, pallor or seizures. Associated symptoms include blurred vision, polydipsia and polyuria. Pertinent negatives for diabetes include no chest pain, no fatigue, no polyphagia and no weakness. There are no hypoglycemic complications. Symptoms are improving. Diabetic complications  include a CVA. Risk factors for coronary artery disease include dyslipidemia, diabetes mellitus, male sex and sedentary lifestyle. Current diabetic treatments: He was supposed to be on multiple daily injections of insulin, however currently taking only metformin 1000 g p.o. twice daily. His weight is increasing steadily (He lost 10-15 pounds recently unintentionally.). He is following a generally unhealthy diet. When asked about meal planning, he reported none. His home blood glucose trend is decreasing steadily. His breakfast blood glucose range is generally 180-200 mg/dl. His overall blood glucose range is >200 mg/dl. Jenny Reichmann presents with his meter showing inadequate monitoring.  He only has 5 readings in the last 14 days averaging 345.  His point-of-care A1c is 8.1%, improving from 12+ percent.   He did not document or report hypoglycemia. ) An ACE inhibitor/angiotensin II receptor blocker is being taken.  Hyperlipidemia This is a chronic problem. The current episode started more than 1 year ago. Exacerbating diseases include diabetes. Pertinent negatives include no chest pain, myalgias or shortness of breath. Current antihyperlipidemic treatment includes statins. Risk factors for coronary artery disease include diabetes mellitus, dyslipidemia, hypertension, male sex and a sedentary lifestyle.  Hypertension This is a chronic problem. The current episode started more than 1 year ago. The problem is controlled. Associated symptoms include blurred vision. Pertinent negatives include no chest pain, headaches, neck  pain, palpitations or shortness of breath. Risk factors for coronary artery disease include dyslipidemia, diabetes mellitus, male gender and sedentary lifestyle. Hypertensive end-organ damage includes CVA.     Review of Systems  Constitutional:  Negative for chills, fatigue, fever and unexpected weight change.  HENT:  Negative for dental problem, mouth sores and trouble swallowing.   Eyes:  Positive for blurred vision. Negative for visual disturbance.  Respiratory:  Negative for cough, choking, chest tightness, shortness of breath and wheezing.   Cardiovascular:  Negative for chest pain, palpitations and leg swelling.  Gastrointestinal:  Negative for abdominal distention, abdominal pain, constipation, diarrhea, nausea and vomiting.  Endocrine: Positive for polydipsia and polyuria. Negative for polyphagia.  Genitourinary:  Negative for dysuria, flank pain, hematuria and urgency.  Musculoskeletal:  Negative for back pain, gait problem, myalgias and neck pain.  Skin:  Negative for pallor, rash and wound.  Neurological:  Negative for seizures, syncope, weakness, numbness and headaches.  Psychiatric/Behavioral:  Negative for confusion and dysphoric mood.     Objective:       07/09/2021   11:32 AM 04/03/2021    8:51 AM 12/10/2020    1:03 PM  Vitals with BMI  Height $Remov'5\' 11"'GFOysz$  $RemoveB'5\' 11"'XScnVHrX$  $RemoveBef'5\' 11"'SICqKYJnrO$   Weight 193 lbs 195 lbs 3 oz 190 lbs 10 oz  BMI 26.93 56.38 93.7  Systolic 342 876 811  Diastolic 86 82 76  Pulse 80 64 84    BP (!) 142/86   Pulse 80   Ht $R'5\' 11"'Rl$  (1.803 m)   Wt 193 lb (87.5 kg)   BMI 26.92 kg/m   Wt Readings from Last 3 Encounters:  07/09/21 193 lb (87.5 kg)  04/03/21 195 lb 3.2 oz (88.5 kg)  12/10/20 190 lb 9.6 oz (86.5 kg)       CMP ( most recent) CMP     Component Value Date/Time   NA 139 03/17/2021 1259   K 4.2 03/17/2021 1259   CL 101 03/17/2021 1259   CO2 26 03/17/2021 1259   GLUCOSE 218 (H) 03/17/2021 1259   GLUCOSE 195 (H) 07/21/2016 1042   BUN 16 03/17/2021  1259   CREATININE 0.93  03/17/2021 1259   CREATININE 0.88 07/21/2016 1042   CALCIUM 9.5 03/17/2021 1259   PROT 6.8 03/17/2021 1259   ALBUMIN 4.4 03/17/2021 1259   AST 20 03/17/2021 1259   ALT 25 03/17/2021 1259   ALKPHOS 83 03/17/2021 1259   BILITOT 0.5 03/17/2021 1259   GFRNONAA >60 05/26/2016 1413   GFRAA >60 05/26/2016 1413     Diabetic Labs (most recent): Lab Results  Component Value Date   HGBA1C 9.8 (A) 04/03/2021   HGBA1C 12.0 10/10/2020   MICROALBUR 1 07/10/2020      Lab Results  Component Value Date   TSH 1.790 03/17/2021   TSH 1.82 06/16/2016   FREET4 1.42 03/17/2021     Lipid Panel     Component Value Date/Time   CHOL 169 03/17/2021 1259   TRIG 142 03/17/2021 1259   HDL 39 (L) 03/17/2021 1259   CHOLHDL 4.3 03/17/2021 1259   LDLCALC 105 (H) 03/17/2021 1259   LABVLDL 25 03/17/2021 1259    Assessment & Plan:   1. DM type 2 causing vascular disease ([place])   - Dustin Roberts has currently uncontrolled symptomatic type 2 DM since  66 years of age.  Ber[place] presents with his meter showing inadequate monitoring.  He only has 5 readings in the last 14 days averaging 345.  His point-of-care A1c is 8.1%, improving from 12+ percent.   He did not document or report hypoglycemia.  Recent labs reviewed.  He did not bring any logs or meter.  Admittedly, he does not have a meter to monitor blood glucose with.    - I had a long discussion with him about the progressive nature of diabetes and the pathology behind its complications. -his diabetes is complicated by CVA, severe persistent hyperglycemia, and he remains at a high risk for more acute and chronic complications which include CAD, CVA, CKD, retinopathy, and neuropathy. These are all discussed in detail with him.  - I have counseled him on diet  and weight management  by adopting a carbohydrate restricted/protein rich diet. Patient is encouraged to switch to  unprocessed or minimally processed     complex starch  and increased protein intake (animal or plant source), fruits, and vegetables. -  he is advised to stick to a routine mealtimes to eat 3 meals  a day and avoid unnecessary snacks ( to snack only to correct hypoglycemia).    - he acknowledges that there is a room for improvement in his food and drink choices. - Suggestion is made for him to avoid simple carbohydrates  from his diet including Cakes, Sweet Desserts, Ice Cream, Soda (diet and regular), Sweet Tea, Candies, Chips, Cookies, Store Bought Juices, Alcohol in Excess of  1-2 drinks a day, Artificial Sweeteners,  Coffee Creamer, and "Sugar-free" Products, Lemonade. This will help patient to have more stable blood glucose profile and potentially avoid unintended weight gain.  - he will be scheduled with Jearld Fenton, RDN, CDE for diabetes education.  - I have approached him with the following individualized plan to manage  his diabetes and patient agrees:   -In light of his presentation with persistent severe hyperglycemia, he will continue to need multiple daily injections of insulin in order for him to achieve and maintain control of diabetes to target.      This patient will likely do better with premixed insulin twice a day for simplicity purposes after he finishes his current supplies of Lantus and NovoLog. -In the meantime, he is advised to  continue Lantus 50 units nightly, continue NovoLog 6  units 3 times daily AC for blood glucose readings between 90 to 150 mg/day, and additional correction dose for blood glucose readings above 150 mg per DL.  For this to happen he has to continue to monitor blood glucose 4 times a day. His insurance provided coverage for Dexcom CGM.  He brought it with him, he will be assisted with his first application.  - he is warned not to take insulin without proper monitoring per orders.  - he is encouraged to call clinic for blood glucose levels less than 70 or above 200 mg /dl. - he is advised to continue  metformin 1000 mg p.o. twice daily, therapeutically suitable for patient . -He is not a suitable candidate for GLP-1 receptor agonists, nor SGLT2 inhibitors. - Specific targets for  A1c;  LDL, HDL,  and Triglycerides were discussed with the patient.  2) Blood Pressure /Hypertension:  His blood pressure is not controlled to target.  he is advised to continue his current medications including lisinopril 40 mg p.o. daily with breakfast .  3) Lipids/Hyperlipidemia:   Review of his recunent lipid panel showed improving lipid profile triglycerides down to 142 from more than 1000, LDL 105 down from 127.    Insulin treatment is helping with severe dyslipidemia, he is advised to continue Crestor 40 mg p.o. nightly.  -Plant predominant, [employer] lifestyle nutrition was discussed with him.  He is advised to avoid trans-, saturated fats.  He will be considered for fenofibrate during subsequent visits.  4)  Weight/Diet:  Body mass index is 26.92 kg/m.  -     he is not a candidate for weight loss. I discussed with him the fact that loss of 5 - 10% of his  current body weight will have the most impact on his diabetes management.  Exercise, and detailed carbohydrates information provided  -  detailed on discharge instructions.  5) Chronic Care/Health Maintenance:  -he  is on ACEI/ARB and Statin medications and  is encouraged to initiate and continue to follow up with Ophthalmology, Dentist,  Podiatrist at least yearly or according to recommendations, and advised to   stay away from smoking. I have recommended yearly flu vaccine and pneumonia vaccine at least every 5 years; moderate intensity exercise for up to 150 minutes weekly; and  sleep for at least 7 hours a day.  - he is  advised to maintain close follow up with Joyice Faster, FNP for primary care needs, as well as his other providers for optimal and coordinated care.    I spent 40 minutes in the care of the patient today including review of  labs from [place], Lipids, Thyroid Function, Hematology (current and previous including abstractions from other facilities); face-to-face time discussing  his blood glucose readings/logs, discussing hypoglycemia and hyperglycemia episodes and symptoms, medications doses, his options of short and long term treatment based on the latest standards of care / guidelines;  discussion about incorporating lifestyle medicine;  and documenting the encounter. Risk reduction counseling performed per USPSTF guidelines to reduce obesity and cardiovascular risk factors.     Please refer to Patient Instructions for Blood Glucose Monitoring and Insulin/Medications Dosing Guide"  in media tab for additional information. Please  also refer to " Patient Self Inventory" in the Media  tab for reviewed elements of pertinent patient history.  Dustin Roberts participated in the discussions, expressed understanding, and voiced agreement with the above plans.  All questions were  answered to his satisfaction. he is encouraged to contact clinic should he have any questions or concerns prior to his return visit.      Follow up plan: - Return in about 3 months (around 10/09/2021) for F/U with Pre-visit Labs, Meter/CGM/Logs, A1c here.  Glade Lloyd, MD [facility] [address] [place], [place] [address] Phone: [phone]  Fax: [phone]    07/09/2021, 2:36 PM  This note was partially dictated with voice recognition software. Similar sounding words can be transcribed inadequately or may not  be corrected upon review.

## 2021-07-09 NOTE — Patient Instructions (Signed)

## 2021-08-23 ENCOUNTER — Other Ambulatory Visit: Payer: Self-pay | Admitting: "Endocrinology

## 2021-09-30 ENCOUNTER — Encounter: Payer: Self-pay | Admitting: Internal Medicine

## 2021-10-09 ENCOUNTER — Ambulatory Visit: Payer: Medicaid Other | Admitting: "Endocrinology

## 2021-10-17 NOTE — Progress Notes (Deleted)
GI Office Note    Referring Provider: Joyice Faster, FNP Primary Care Physician:  Coolidge Breeze, FNP  Primary Gastroenterologist: Cristopher Estimable.Rourk, MD  Chief Complaint   No chief complaint on file.   History of Present Illness   Dustin Roberts is a 66 y.o. male presenting today at the request of Joyice Faster, FNP for ***consult TCS.  EGD May 2018 with 2 cm Barrett's appearing mucosa with negative biopsy for metaplasia, negative duodenal biopsy.  Colonoscopy same day with sigmoid and descending diverticulosis, hyperplastic polyp, and internal hemorrhoids.  Advised repeat colonoscopy in 10 years.  Previously followed by our clinic, last seen in 2018.  Last seen regarding anemia, anorexia, and weight loss.  History of cholelithiasis without cholecystitis.  Had recent EGD, colonoscopy, and capsule study as well as CT without any pathology to explain iron deficiency anemia.  Continue to report intermittent dark stools without BRBPR.  He denies any postprandial abdominal pain or other abdominal pain.  Reflux was controlled on pantoprazole 40 mg daily.  Reported that capsule study may have revealed subtle telltale signs of bleeding but nothing conclusive.  Advised to stop Excedrin use given possible NSAID insult to the GI tract.  CBC, sed rate, and CMP ordered.  Patient and wife were advised that if work-up continue to be negative that he may need tertiary referral.  Hemoglobin 12.  Gastric emptying study with 4% emptying at 1 hour but 94% MD at 3 hours.  CTE in June negative.  Began following with Dr. Reesa Chew with [facility] in August 2018.  His last visit there was 01/13/17.  Noted per history to have received intermittent iron infusions  It was noted that his last visit there his hemoglobin was 12, MCV 80.  CMP was unremarkable.  Iron panel with iron 50, ferritin 17.  Reported he did not have any melena.  He has seen hematologist at [facility] who performed a push  enteroscopy noting only erosive esophagitis, previously seen Barrett's type mucosa, negative for intestinal metaplasia on path.  Labs updated 01/13/2017 with iron 19, ferritin 49, hemoglobin 12.8, MCV 83, platelets 201.  Patient also had chronic nausea which she reported was improved on PPI.  Also with constipation with overflow diarrhea that was improved on bowel regimen.  He was advised to pursue repeat capsule endoscopy.  Do not see in chart review that repeat capsule endoscopy was performed.  Today:    Current Outpatient Medications  Medication Sig Dispense Refill   aspirin-acetaminophen-caffeine (EXCEDRIN MIGRAINE) 250-250-65 MG tablet Take 1 tablet by mouth every 6 (six) hours as needed for headache.     Blood Glucose Monitoring Suppl (ACCU-CHEK GUIDE ME) w/Device KIT 1 Piece by Does not apply route as directed. 1 kit 0   Continuous Blood Gluc Receiver (DEXCOM G7 RECEIVER) DEVI Use to monitor BG continuously 1 each 0   Continuous Blood Gluc Sensor (DEXCOM G7 SENSOR) MISC Change sensor every 10 days 3 each 2   docusate sodium (COLACE CLEAR) 50 MG capsule Take 1 capsule (50 mg total) by mouth daily as needed for mild constipation. As needed for constipation associated with iron supplement (Patient not taking: Reported on 11/12/2020) 30 capsule 0   ferrous sulfate 325 (65 FE) MG EC tablet Take 1 tablet (325 mg total) by mouth daily with breakfast. 30 tablet 3   fluticasone-salmeterol (ADVAIR DISKUS) 250-50 MCG/ACT AEPB Place 1 puff into the nose in the morning and at bedtime.  gabapentin (NEURONTIN) 300 MG capsule Take 300 mg by mouth 2 (two) times daily.     GLOBAL EASE INJECT PEN NEEDLES 32G X 4 MM MISC USE TO INJECT INSULIN 4 TIMES DAILY. 100 each 0   glucose blood (ACCU-CHEK GUIDE) test strip USE TO MONITOR GLUCOSE 4 TIMES DAILY. 100 strip 2   hydrochlorothiazide (MICROZIDE) 12.5 MG capsule Take 12.5 mg by mouth daily.     hydrocortisone (ANUSOL-HC) 2.5 % rectal cream Place 1 application  rectally 2 (two) times daily. For 10 days. (Patient not taking: Reported on 12/10/2020) 30 g 0   insulin aspart (NOVOLOG) 100 UNIT/ML FlexPen Inject 6-12 Units into the skin 3 (three) times daily before meals. 15 mL 1   Insulin Glargine Solostar (LANTUS) 100 UNIT/ML Solostar Pen Inject 50 Units into the skin at bedtime. 15 mL 2   lisinopril (PRINIVIL,ZESTRIL) 40 MG tablet Take 40 mg by mouth every evening.   3   loratadine (CLARITIN) 10 MG tablet Take 10 mg by mouth every evening.  (Patient not taking: Reported on 11/12/2020)     metFORMIN (GLUCOPHAGE) 1000 MG tablet Take 1,000 mg by mouth 2 (two) times daily.  3   ondansetron (ZOFRAN) 4 MG tablet TAKE 1 TABLET BY MOUTH EVERY 4 HOURS AS NEEDED FOR NAUSEA/ VOMITING 120 tablet 0   pantoprazole (PROTONIX) 40 MG tablet Take 40 mg by mouth every evening.   3   rosuvastatin (CRESTOR) 40 MG tablet Take 40 mg by mouth daily.     No current facility-administered medications for this visit.    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   History of kidney stones    HOH (hard of hearing)    Hyperlipidemia    Hypertension    IDA (iron deficiency anemia)    Myocardial infarction ([place])    2008   Peptic ulcer disease    Seizures ([place])    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 knife surgery 2008   Sleep apnea    Stroke [facility])    Due to brain aneurysm    Past Surgical History:  Procedure Laterality Date   APPENDECTOMY     BIOPSY  05/27/2016   Procedure: BIOPSY;  Surgeon: Daneil Dolin, MD;  [facility];  Service: Endoscopy;;  Duodenal, esophagus   BRAIN SURGERY     aneurysm, age 23   CEREBRAL ANEURYSM REPAIR     Second procedure was gamma knife repair   COLONOSCOPY WITH PROPOFOL N/A 05/27/2016    Procedure: COLONOSCOPY WITH PROPOFOL;  Surgeon: Daneil Dolin, MD;  [facility]: [facility];  Service: Endoscopy;  Laterality: N/A;  1230    ESOPHAGOGASTRODUODENOSCOPY (EGD) WITH PROPOFOL N/A 05/27/2016   Procedure: ESOPHAGOGASTRODUODENOSCOPY (EGD) WITH PROPOFOL;  Surgeon: Daneil Dolin, MD;  [facility];  Service: Endoscopy;  Laterality: N/A;   FRACTURE SURGERY     left wrist   GIVENS CAPSULE STUDY N/A 06/08/2016   Procedure: GIVENS CAPSULE STUDY;  Surgeon: Daneil Dolin, MD;  [facility];  Service: Endoscopy;  Laterality: N/A;  7:30am. Patient to arrive at 7:00am   HERNIA REPAIR     left inguinal and umbilical   KNEE SURGERY Left    twice  POLYPECTOMY  05/27/2016   Procedure: POLYPECTOMY;  Surgeon: Daneil Dolin, MD;  [facility];  Service: Endoscopy;;  recto-sigmoid polyp   ROTATOR CUFF REPAIR Left    x2    Family History  Problem Relation Age of Onset   Colon cancer Neg Hx     Allergies as of 10/20/2021 - Review Complete 07/09/2021  Allergen Reaction Noted   Morphine and related  05/25/2016    Social History   Socioeconomic History   Marital status: Legally Separated    Spouse name: Not on file   Number of children: Not on file   Years of education: Not on file   Highest education level: Not on file  Occupational History   Not on file  Tobacco Use   Smoking status: Never   Smokeless tobacco: Never  Vaping Use   Vaping Use: Never used  Substance and Sexual Activity   Alcohol use: No   Drug use: No   Sexual activity: Yes    Birth control/protection: None  Other Topics Concern   Not on file  Social History Narrative   Not on file   Social Determinants of Health   Financial Resource Strain: Not on file  Food Insecurity: Not on file  Transportation Needs: Not on file  Physical Activity: Not on file  Stress: Not on file  Social Connections: Not on file  Intimate Partner Violence: Not on file     Review of Systems    Gen: Denies any fever, chills, fatigue, weight loss, lack of appetite.  CV: Denies chest pain, heart palpitations, peripheral edema, syncope.  Resp: Denies shortness of breath at rest or with exertion. Denies wheezing or cough.  GI: see HPI GU : Denies urinary burning, urinary frequency, urinary hesitancy MS: Denies joint pain, muscle weakness, cramps, or limitation of movement.  Derm: Denies rash, itching, dry skin Psych: Denies depression, anxiety, memory loss, and confusion Heme: Denies bruising, bleeding, and enlarged lymph nodes.   Physical Exam   There were no vitals taken for this visit.  General:   Alert and oriented. Pleasant and cooperative. Well-nourished and well-developed.  Head:  Normocephalic and atraumatic. Eyes:  Without icterus, sclera clear and conjunctiva pink.  Ears:  Normal auditory acuity. Mouth:  No deformity or lesions, oral mucosa pink.  Lungs:  Clear to auscultation bilaterally. No wheezes, rales, or rhonchi. No distress.  Heart:  S1, S2 present without murmurs appreciated.  Abdomen:  +BS, soft, non-tender and non-distended. No HSM noted. No guarding or rebound. No masses appreciated.  Rectal:  Deferred  Msk:  Symmetrical without gross deformities. Normal posture. Extremities:  Without edema. Neurologic:  Alert and  oriented x4;  grossly normal neurologically. Skin:  Intact without significant lesions or rashes. Psych:  Alert and cooperative. Normal mood and affect.   Assessment   Dustin Roberts is a 66 y.o. male with a history of iron deficiency anemia, CHF, COPD, diabetes, GERD, HLD, HTN, MI in 2008, peptic ulcer disease, seizures, stroke*** presenting today for evaluation prior to scheduling colonoscopy.  Iron deficiency anemia: Last iron panel 04/11/20 completely normal.  No recent CBC on file, last hemoglobin was 12.8 on 01/13/2017.  He has had prior work-up with EGD, colonoscopy, capsule study, CTE, and enteroscopy all unremarkable.  Only mild  esophagitis and known Barrett's.  Prior gastric emptying study also unremarkable.  Previously followed with hematology, seen for consultation at [facility].  Has history of intermittent iron infusions.  Screening for colon cancer: Last colonoscopy  in May 2018 with sigmoid and descending colon diverticulosis, single hyperplastic polyp at the rectosigmoid colon, internal hemorrhoids.  Advised repeat colonoscopy in 10 years.   PLAN   ***    Venetia Night, MSN, FNP-BC, AGACNP-BC [facility]

## 2021-10-20 ENCOUNTER — Ambulatory Visit: Payer: Medicare HMO | Admitting: Gastroenterology

## 2021-10-20 ENCOUNTER — Other Ambulatory Visit: Payer: Self-pay | Admitting: "Endocrinology

## 2021-10-28 ENCOUNTER — Telehealth: Payer: Self-pay | Admitting: "Endocrinology

## 2021-10-28 DIAGNOSIS — E1159 Type 2 diabetes mellitus with other circulatory complications: Secondary | ICD-10-CM

## 2021-10-28 MED ORDER — DEXCOM G7 SENSOR MISC: 2 refills | Status: DC

## 2021-10-28 NOTE — Telephone Encounter (Signed)
Rx sent 

## 2021-10-28 NOTE — Telephone Encounter (Signed)
Patient's ex wife Tammy called and said that he needs a refill on his sensors. She said he did get them in the mail before but is not sure where they came from. Can you send it to [facility].

## 2021-10-31 ENCOUNTER — Other Ambulatory Visit (HOSPITAL_COMMUNITY): Payer: Self-pay

## 2021-11-03 ENCOUNTER — Other Ambulatory Visit (HOSPITAL_COMMUNITY): Payer: Self-pay

## 2021-11-03 ENCOUNTER — Telehealth: Payer: Self-pay

## 2021-11-03 NOTE — Telephone Encounter (Signed)
Pharmacy Patient Advocate Encounter   Received notification from Cover My Meds that prior authorization for Dexcom G7 Sensor is required/requested.  Per Test Claim: [place] Medicaid will not cover because patient has Medicare part D   PA submitted on 11/03/2021 to [facility] via [place]  Status is pending

## 2021-11-11 ENCOUNTER — Other Ambulatory Visit (HOSPITAL_COMMUNITY): Payer: Self-pay

## 2021-11-11 NOTE — Telephone Encounter (Signed)
Received a fax regarding Prior Authorization from [facility] for [place] Sensor.  Authorization has been DENIED. Could be billed under Medicare Part B.   Key: [id number]   PA Case ID: [id number]

## 2021-11-11 NOTE — Telephone Encounter (Signed)
Pt has been getting his Dexcom supplies from [employer].

## 2022-02-13 ENCOUNTER — Other Ambulatory Visit: Payer: Self-pay | Admitting: "Endocrinology

## 2022-03-24 ENCOUNTER — Other Ambulatory Visit (HOSPITAL_COMMUNITY): Payer: Self-pay | Admitting: Family Medicine

## 2022-03-24 DIAGNOSIS — T148XXD Other injury of unspecified body region, subsequent encounter: Secondary | ICD-10-CM

## 2022-03-30 ENCOUNTER — Emergency Department (HOSPITAL_COMMUNITY): Payer: Medicare HMO

## 2022-03-30 ENCOUNTER — Other Ambulatory Visit: Payer: Self-pay

## 2022-03-30 ENCOUNTER — Emergency Department (HOSPITAL_COMMUNITY)
Admission: EM | Admit: 2022-03-30 | Discharge: 2022-03-31 | Disposition: A | Discharge status: Home / Self Care | Payer: Medicare HMO | Attending: Emergency Medicine | Admitting: Emergency Medicine

## 2022-03-30 DIAGNOSIS — Z8679 Personal history of other diseases of the circulatory system: Secondary | ICD-10-CM | POA: Insufficient documentation

## 2022-03-30 DIAGNOSIS — Z7982 Long term (current) use of aspirin: Secondary | ICD-10-CM | POA: Diagnosis not present

## 2022-03-30 DIAGNOSIS — Z8673 Personal history of transient ischemic attack (TIA), and cerebral infarction without residual deficits: Secondary | ICD-10-CM | POA: Diagnosis not present

## 2022-03-30 DIAGNOSIS — R519 Headache, unspecified: Secondary | ICD-10-CM | POA: Diagnosis present

## 2022-03-30 DIAGNOSIS — W19XXXA Unspecified fall, initial encounter: Secondary | ICD-10-CM

## 2022-03-30 DIAGNOSIS — S060XAA Concussion with loss of consciousness status unknown, initial encounter: Secondary | ICD-10-CM | POA: Diagnosis not present

## 2022-03-30 LAB — CBC
HCT: 44.7 % (ref 39.0–52.0)
Hemoglobin: 14.8 g/dL (ref 13.0–17.0)
MCH: 27 pg (ref 26.0–34.0)
MCHC: 33.1 g/dL (ref 30.0–36.0)
MCV: 81.4 fL (ref 80.0–100.0)
Platelets: 206 10*3/uL (ref 150–400)
RBC: 5.49 MIL/uL (ref 4.22–5.81)
RDW: 13.2 % (ref 11.5–15.5)
WBC: 9.6 10*3/uL (ref 4.0–10.5)
nRBC: 0 % (ref 0.0–0.2)

## 2022-03-30 LAB — BASIC METABOLIC PANEL
Anion gap: 8 (ref 5–15)
BUN: 24 mg/dL — ABNORMAL HIGH (ref 8–23)
CO2: 26 mmol/L (ref 22–32)
Calcium: 9.1 mg/dL (ref 8.9–10.3)
Chloride: 104 mmol/L (ref 98–111)
Creatinine, Ser: 0.74 mg/dL (ref 0.61–1.24)
GFR, Estimated: >60 mL/min (ref >60)
Glucose, Bld: 210 mg/dL — ABNORMAL HIGH (ref 70–99)
Potassium: 3.5 mmol/L (ref 3.5–5.1)
Sodium: 138 mmol/L (ref 135–145)

## 2022-03-30 MED ORDER — IOHEXOL 350 MG/ML SOLN
75.0000 mL | Freq: Once | INTRAVENOUS | Status: AC | PRN
  Administered 2022-03-30: 75 mL via INTRAVENOUS

## 2022-03-30 NOTE — ED Notes (Signed)
Patient transported to CT 
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
## 2022-03-30 NOTE — ED Provider Notes (Signed)
[facility] Provider Note   CSN: [id number] Arrival date & time: 03/30/22  1650     History {Add pertinent medical, surgical, social history, OB history to HPI:1} Chief Complaint  Patient presents with  . Fall    Dustin Roberts is a 67 y.o. male.  HPI     Home Medications Prior to Admission medications   Medication Sig Start Date End Date Taking? Authorizing Provider  aspirin-acetaminophen-caffeine (EXCEDRIN MIGRAINE) 504 [id number] MG tablet Take 1 tablet by mouth every 6 (six) hours as needed for headache.   Yes [provider]  atorvastatin (LIPITOR) 80 MG tablet Take 80 mg by mouth daily.   Yes [provider]  docusate sodium (COLACE CLEAR) 50 MG capsule Take 1 capsule (50 mg total) by mouth daily as needed for mild constipation. As needed for constipation associated with iron supplement 04/11/20  Yes Pennington, Rebekah M, PA-C  ferrous sulfate 325 (65 FE) MG EC tablet Take 1 tablet (325 mg total) by mouth daily with breakfast. 04/11/20  Yes Pennington, Rebekah M, PA-C  fluticasone-salmeterol (ADVAIR DISKUS) 250-50 MCG/ACT AEPB Place 1 puff into the nose in the morning and at bedtime.   Yes [provider]  gabapentin (NEURONTIN) 300 MG capsule Take 300 mg by mouth 2 (two) times daily.   Yes [provider]  hydrochlorothiazide (MICROZIDE) 12.5 MG capsule Take 12.5 mg by mouth daily.   Yes [provider]  insulin aspart (NOVOLOG) 100 UNIT/ML FlexPen Inject 6-12 Units into the skin 3 (three) times daily before meals. 12/10/20  Yes Nida, Marella Chimes, MD  Insulin Glargine Solostar (LANTUS) 100 UNIT/ML Solostar Pen Inject 50 Units into the skin at bedtime. 06/19/21  Yes Nida, Marella Chimes, MD  lisinopril (PRINIVIL,ZESTRIL) 40 MG tablet Take 40 mg by mouth every evening.  12/17/14  Yes [provider]  loratadine (CLARITIN) 10 MG tablet Take 10 mg by mouth every evening.   Yes [provider]  metFORMIN (GLUCOPHAGE) 1000 MG tablet Take 1,000 mg by mouth 2 (two) times daily. 01/01/15  Yes [provider]  ondansetron (ZOFRAN) 4 MG tablet TAKE 1 TABLET BY MOUTH EVERY 4 HOURS AS NEEDED FOR NAUSEA/ VOMITING Patient taking differently: Take 4 mg by mouth every 4 (four) hours as needed for nausea or vomiting. 07/23/17  Yes Annitta Needs, NP  pantoprazole (PROTONIX) 40 MG tablet Take 40 mg by mouth every evening.  01/01/15  Yes [provider]  rosuvastatin (CRESTOR) 40 MG tablet Take 40 mg by mouth daily.   Yes [provider]  ACCU-CHEK GUIDE test strip USE TO MONITOR GLUCOSE 4 TIMES DAILY. 10/21/21   Cassandria Anger, MD  Blood Glucose Monitoring Suppl (ACCU-CHEK GUIDE ME) w/Device KIT 1 Piece by Does not apply route as directed. 11/12/20   Cassandria Anger, MD  GLOBAL EASE INJECT PEN NEEDLES 32G X 4 MM MISC USE TO INJECT INSULIN 4 TIMES DAILY. 10/21/21   Cassandria Anger, MD      Allergies    Morphine and related    Review of Systems   Review of Systems  Physical Exam Updated Vital Signs BP (!) 164/104 (BP Location: Right Arm)   Pulse 97   Temp 98.1 F (36.7 C) (Oral)   Resp 20   Ht 5\' 11"  (1.803 m)   Wt 89.8 kg   SpO2 95%   BMI 27.62 kg/m  Physical Exam  ED Results / Procedures / Treatments   Labs (  all labs ordered are listed, but only abnormal results are displayed) Labs Reviewed  BASIC METABOLIC PANEL - Abnormal; Notable for the following components:      Result Value   Glucose, Bld 210 (*)    BUN 24 (*)    All other components within normal limits  CBC  I-STAT CHEM 8, ED    EKG None  Radiology No results found.  Procedures Procedures  {Document cardiac monitor, telemetry assessment procedure when appropriate:1}  Medications Ordered in ED Medications - No data to display  ED Course/ Medical Decision Making/ A&P   {   Click here for ABCD2, HEART and other calculatorsREFRESH Note before  signing :1}                          Medical Decision Making Amount and/or Complexity of Data Reviewed Labs: ordered. Radiology: ordered.   ***  {Document critical care time when appropriate:1} {Document review of labs and clinical decision tools ie heart score, Chads2Vasc2 etc:1}  {Document your independent review of radiology images, and any outside records:1} {Document your discussion with family members, caretakers, and with consultants:1} {Document social determinants of health affecting pt's care:1} {Document your decision making why or why not admission, treatments were needed:1} Final Clinical Impression(s) / ED Diagnoses Final diagnoses:  None    Rx / DC Orders ED Discharge Orders     None
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
## 2022-04-16 ENCOUNTER — Ambulatory Visit (HOSPITAL_COMMUNITY)
Admission: RE | Admit: 2022-04-16 | Discharge: 2022-04-16 | Disposition: A | Discharge status: Home / Self Care | Payer: Medicare HMO | Source: Ambulatory Visit | Attending: Family Medicine | Admitting: Family Medicine

## 2022-04-16 DIAGNOSIS — T148XXD Other injury of unspecified body region, subsequent encounter: Secondary | ICD-10-CM | POA: Diagnosis present

## 2022-08-05 ENCOUNTER — Ambulatory Visit: Payer: Medicare HMO | Admitting: Podiatry

## 2022-09-30 ENCOUNTER — Ambulatory Visit: Payer: Medicare HMO | Admitting: "Endocrinology

## 2022-10-03 ENCOUNTER — Emergency Department (HOSPITAL_COMMUNITY)
Admission: EM | Admit: 2022-10-03 | Discharge: 2022-10-03 | Disposition: A | Discharge status: Home / Self Care | Payer: Medicare HMO | Attending: Emergency Medicine | Admitting: Emergency Medicine

## 2022-10-03 ENCOUNTER — Encounter (HOSPITAL_COMMUNITY): Payer: Self-pay | Admitting: *Deleted

## 2022-10-03 ENCOUNTER — Emergency Department (HOSPITAL_COMMUNITY): Payer: Medicare HMO

## 2022-10-03 ENCOUNTER — Other Ambulatory Visit: Payer: Self-pay

## 2022-10-03 DIAGNOSIS — I509 Heart failure, unspecified: Secondary | ICD-10-CM | POA: Diagnosis not present

## 2022-10-03 DIAGNOSIS — Z7984 Long term (current) use of oral hypoglycemic drugs: Secondary | ICD-10-CM | POA: Diagnosis not present

## 2022-10-03 DIAGNOSIS — M25562 Pain in left knee: Secondary | ICD-10-CM | POA: Diagnosis present

## 2022-10-03 DIAGNOSIS — W208XXA Other cause of strike by thrown, projected or falling object, initial encounter: Secondary | ICD-10-CM | POA: Insufficient documentation

## 2022-10-03 DIAGNOSIS — J449 Chronic obstructive pulmonary disease, unspecified: Secondary | ICD-10-CM | POA: Diagnosis not present

## 2022-10-03 DIAGNOSIS — E119 Type 2 diabetes mellitus without complications: Secondary | ICD-10-CM | POA: Diagnosis not present

## 2022-10-03 MED ORDER — MELOXICAM 15 MG PO TABS: 15.0000 mg | ORAL_TABLET | Freq: Every day | ORAL | 0 refills | Status: DC | PRN

## 2022-10-03 MED ORDER — HYDROCODONE-ACETAMINOPHEN 5-325 MG PO TABS
1.0000 | ORAL_TABLET | Freq: Once | ORAL | Status: AC
  Administered 2022-10-03: 1 via ORAL
  Filled 2022-10-03: qty 1

## 2022-10-03 MED ORDER — HYDROCODONE-ACETAMINOPHEN 5-325 MG PO TABS: 1.0000 | ORAL_TABLET | Freq: Four times a day (QID) | ORAL | 0 refills | Status: DC | PRN

## 2022-10-03 NOTE — Discharge Instructions (Addendum)
As discussed, x-ray was negative for any fracture but was positive for pretty significant swelling.  Given mechanism of injury as well as feelings of instability, concern for underlying ligament injury and your knee.  Recommend continue use of knee brace as well as crutches until follow-up with orthopedics.  Will send in medication for baseline pain with pain medication for breakthrough pain.  Recommend continued use of ice over knee as well as elevating above level of your heart to help with swelling.  Please do not hesitate to return to emergency department for worrisome signs and symptoms we discussed become apparent.
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
## 2022-10-06 ENCOUNTER — Encounter: Payer: Self-pay | Admitting: Orthopedic Surgery

## 2022-10-06 ENCOUNTER — Ambulatory Visit (INDEPENDENT_AMBULATORY_CARE_PROVIDER_SITE_OTHER): Payer: Medicare HMO | Admitting: Orthopedic Surgery

## 2022-10-06 DIAGNOSIS — S8002XA Contusion of left knee, initial encounter: Secondary | ICD-10-CM | POA: Diagnosis not present

## 2022-10-06 MED ORDER — HYDROCODONE-ACETAMINOPHEN 10-325 MG PO TABS: 1.0000 | ORAL_TABLET | Freq: Four times a day (QID) | ORAL | 0 refills | Status: DC | PRN

## 2022-10-06 MED ORDER — IBUPROFEN 800 MG PO TABS: 800.0000 mg | ORAL_TABLET | Freq: Three times a day (TID) | ORAL | 1 refills | Status: DC | PRN

## 2022-10-06 NOTE — Patient Instructions (Signed)
ICE 3 X A WEEK   BRACE FOR WALKING   FOR PAIN TAKE BOTH MEDICATIONS AS DIRECTED
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
## 2022-10-19 ENCOUNTER — Ambulatory Visit (INDEPENDENT_AMBULATORY_CARE_PROVIDER_SITE_OTHER): Payer: Medicare HMO | Admitting: Orthopedic Surgery

## 2022-10-19 ENCOUNTER — Encounter: Payer: Self-pay | Admitting: Orthopedic Surgery

## 2022-10-19 DIAGNOSIS — S8002XA Contusion of left knee, initial encounter: Secondary | ICD-10-CM | POA: Diagnosis not present

## 2022-10-19 MED ORDER — HYDROCODONE-ACETAMINOPHEN 10-325 MG PO TABS: 1.0000 | ORAL_TABLET | Freq: Three times a day (TID) | ORAL | 0 refills | Status: AC | PRN

## 2022-10-20 NOTE — Progress Notes (Signed)
Chief Complaint  Patient presents with   Knee Pain    Left     Encounter Diagnosis  Name Primary?   Contusion of left knee, initial encounter     F/u contusion left knee   He signif better   Rom returned to baseline   Meds ordered this encounter  Medications   HYDROcodone-acetaminophen (NORCO) 10-325 MG tablet    Sig: Take 1 tablet by mouth every 8 (eight) hours as needed for up to 3 days.    Dispense:  10 tablet    Refill:  0   He wanted more pain meds just in case   1 time rx   Ret as needed

## 2023-11-18 ENCOUNTER — Emergency Department (HOSPITAL_COMMUNITY)

## 2023-11-18 ENCOUNTER — Other Ambulatory Visit: Payer: Self-pay

## 2023-11-18 ENCOUNTER — Observation Stay (HOSPITAL_COMMUNITY)
Admission: EM | Admit: 2023-11-18 | Discharge: 2023-11-20 | Disposition: A | Discharge status: Home / Self Care | Attending: Internal Medicine | Admitting: Internal Medicine

## 2023-11-18 DIAGNOSIS — Z8639 Personal history of other endocrine, nutritional and metabolic disease: Secondary | ICD-10-CM

## 2023-11-18 DIAGNOSIS — I639 Cerebral infarction, unspecified: Principal | ICD-10-CM | POA: Insufficient documentation

## 2023-11-18 DIAGNOSIS — Z7984 Long term (current) use of oral hypoglycemic drugs: Secondary | ICD-10-CM | POA: Insufficient documentation

## 2023-11-18 DIAGNOSIS — R519 Headache, unspecified: Principal | ICD-10-CM | POA: Diagnosis present

## 2023-11-18 DIAGNOSIS — E782 Mixed hyperlipidemia: Secondary | ICD-10-CM | POA: Insufficient documentation

## 2023-11-18 DIAGNOSIS — J449 Chronic obstructive pulmonary disease, unspecified: Secondary | ICD-10-CM | POA: Insufficient documentation

## 2023-11-18 DIAGNOSIS — I110 Hypertensive heart disease with heart failure: Secondary | ICD-10-CM | POA: Insufficient documentation

## 2023-11-18 DIAGNOSIS — R748 Abnormal levels of other serum enzymes: Secondary | ICD-10-CM | POA: Insufficient documentation

## 2023-11-18 DIAGNOSIS — E1165 Type 2 diabetes mellitus with hyperglycemia: Secondary | ICD-10-CM | POA: Insufficient documentation

## 2023-11-18 DIAGNOSIS — Z79899 Other long term (current) drug therapy: Secondary | ICD-10-CM | POA: Insufficient documentation

## 2023-11-18 DIAGNOSIS — I252 Old myocardial infarction: Secondary | ICD-10-CM | POA: Insufficient documentation

## 2023-11-18 DIAGNOSIS — M25512 Pain in left shoulder: Secondary | ICD-10-CM | POA: Insufficient documentation

## 2023-11-18 DIAGNOSIS — Z7982 Long term (current) use of aspirin: Secondary | ICD-10-CM | POA: Insufficient documentation

## 2023-11-18 DIAGNOSIS — K219 Gastro-esophageal reflux disease without esophagitis: Secondary | ICD-10-CM | POA: Insufficient documentation

## 2023-11-18 DIAGNOSIS — I509 Heart failure, unspecified: Secondary | ICD-10-CM | POA: Insufficient documentation

## 2023-11-18 LAB — CBC WITH DIFFERENTIAL/PLATELET
Abs Immature Granulocytes: 0.04 K/uL (ref 0.00–0.07)
Basophils Absolute: 0.1 K/uL (ref 0.0–0.1)
Basophils Relative: 1 %
Eosinophils Absolute: 0.2 K/uL (ref 0.0–0.5)
Eosinophils Relative: 2 %
HCT: 44 % (ref 39.0–52.0)
Hemoglobin: 14.8 g/dL (ref 13.0–17.0)
Immature Granulocytes: 1 %
Lymphocytes Relative: 20 %
Lymphs Abs: 1.6 K/uL (ref 0.7–4.0)
MCH: 27.7 pg (ref 26.0–34.0)
MCHC: 33.6 g/dL (ref 30.0–36.0)
MCV: 82.2 fL (ref 80.0–100.0)
Monocytes Absolute: 0.8 K/uL (ref 0.1–1.0)
Monocytes Relative: 9 %
Neutro Abs: 5.7 K/uL (ref 1.7–7.7)
Neutrophils Relative %: 67 %
Platelets: 184 K/uL (ref 150–400)
RBC: 5.35 MIL/uL (ref 4.22–5.81)
RDW: 11.6 % (ref 11.5–15.5)
WBC: 8.3 K/uL (ref 4.0–10.5)
nRBC: 0 % (ref 0.0–0.2)

## 2023-11-18 LAB — COMPREHENSIVE METABOLIC PANEL WITH GFR
ALT: 17 U/L (ref 0–44)
AST: 17 U/L (ref 15–41)
Albumin: 4.2 g/dL (ref 3.5–5.0)
Alkaline Phosphatase: 138 U/L — ABNORMAL HIGH (ref 38–126)
Anion gap: 13 (ref 5–15)
BUN: 14 mg/dL (ref 8–23)
CO2: 26 mmol/L (ref 22–32)
Calcium: 9.7 mg/dL (ref 8.9–10.3)
Chloride: 96 mmol/L — ABNORMAL LOW (ref 98–111)
Creatinine, Ser: 0.76 mg/dL (ref 0.61–1.24)
GFR, Estimated: >60 mL/min (ref >60)
Glucose, Bld: 334 mg/dL — ABNORMAL HIGH (ref 70–99)
Potassium: 4.1 mmol/L (ref 3.5–5.1)
Sodium: 135 mmol/L (ref 135–145)
Total Bilirubin: 0.7 mg/dL (ref 0.0–1.2)
Total Protein: 7.8 g/dL (ref 6.5–8.1)

## 2023-11-18 LAB — RESP PANEL BY RT-PCR (RSV, FLU A&B, COVID)  RVPGX2
Influenza A by PCR: NEGATIVE
Influenza B by PCR: NEGATIVE
Resp Syncytial Virus by PCR: NEGATIVE
SARS Coronavirus 2 by RT PCR: NEGATIVE

## 2023-11-18 LAB — PROTIME-INR
INR: 1 (ref 0.8–1.2)
Prothrombin Time: 14 s (ref 11.4–15.2)

## 2023-11-18 MED ORDER — PROCHLORPERAZINE EDISYLATE 10 MG/2ML IJ SOLN
10.0000 mg | Freq: Once | INTRAMUSCULAR | Status: AC
  Administered 2023-11-18: 10 mg via INTRAVENOUS
  Filled 2023-11-18: qty 2

## 2023-11-18 MED ORDER — ACETAMINOPHEN 500 MG PO TABS
1000.0000 mg | ORAL_TABLET | Freq: Once | ORAL | Status: AC
  Administered 2023-11-18: 1000 mg via ORAL
  Filled 2023-11-18: qty 2

## 2023-11-18 MED ORDER — LACTATED RINGERS IV BOLUS
1000.0000 mL | Freq: Once | INTRAVENOUS | Status: AC
  Administered 2023-11-18: 1000 mL via INTRAVENOUS

## 2023-11-18 MED ORDER — DIPHENHYDRAMINE HCL 50 MG/ML IJ SOLN
12.5000 mg | Freq: Once | INTRAMUSCULAR | Status: AC
  Administered 2023-11-18: 12.5 mg via INTRAVENOUS
  Filled 2023-11-18: qty 1

## 2023-11-18 MED ORDER — IOHEXOL 350 MG/ML SOLN
75.0000 mL | Freq: Once | INTRAVENOUS | Status: AC | PRN
  Administered 2023-11-18: 75 mL via INTRAVENOUS

## 2023-11-18 NOTE — ED Triage Notes (Signed)
 Pt presents with headache x 2 weeks. Went to [facility] hospital last Sunday and treated for Migraine. Pain has not gone away and today pain got very bad. Denies nausea/vomiting.

## 2023-11-18 NOTE — Plan of Care (Signed)
 Brief Neuro Note:  Briefly, Mr. 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 This has progressed since prior CTA in March 2024. With new onset severe headaches at his age and noted vessel imaging, there is concern for RCVS vs PACNS. This may alternatively just be progressive intracranial atherosclerosis.  He needs MRI Brain and detailed headache history, identifying potential triggers and RCVS2 scoring. Would be better served at [facility] rather than [facility]. There is a chance he may need diagnostic cerebral angiogram if diagnosis is still unclear.  Treatment is supportive thou.  Please let us  know when the patient is at [facility].  Plan discussed with Dr. Raford with the ED team.  Maricus Tanzi [facility]

## 2023-11-18 NOTE — ED Provider Notes (Signed)
 [place] EMERGENCY DEPARTMENT AT [facility] Provider Note  CSN: [id number] Arrival date & time: 11/18/23 1655  Chief Complaint(s) Headache  HPI Dustin Roberts is a 68 y.o. male history of CHF, COPD, prior stroke presenting to the emergency department with headache.  Patient reports headache for 2 weeks.  Reports that he has been having constant headaches.  Denies any head trauma.  Denies fevers or chills.  Went to outside hospital in [place] and had testing done, reports was told he was having a migraine and received treatment, this did help but the headache is subsequently returned.  Denies any weakness, numbness, tingling, trouble walking.   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   History of kidney stones    HOH (hard of hearing)    Hyperlipidemia    Hypertension    IDA (iron deficiency anemia)    Myocardial infarction (HCC)    2008   Peptic ulcer disease    Seizures (HCC)    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 knife surgery 2008   Sleep apnea    Stroke [facility])    Due to brain aneurysm   Patient Active Problem List   Diagnosis Date Noted   Non-adherence to medical treatment 07/09/2021   DM type 2 causing vascular disease (HCC) 11/12/2020   Mixed hyperlipidemia 11/12/2020   Essential hypertension, benign 11/12/2020   H/O type 2 diabetes mellitus 11/06/2016   Nausea with vomiting 06/16/2016   IDA (iron deficiency anemia) 05/25/2016   GERD (gastroesophageal reflux disease) 05/25/2016   Left sided abdominal pain 05/25/2016   Diarrhea 05/25/2016   AVM (arteriovenous malformation) 08/21/2013   Home Medication(s) Prior to Admission medications   Medication Sig Start Date End Date Taking? Authorizing  Provider  ACCU-CHEK GUIDE test strip USE TO MONITOR GLUCOSE 4 TIMES DAILY. 10/21/21   Nida, Gebreselassie W, MD  atorvastatin (LIPITOR) 80 MG tablet Take 80 mg by mouth daily.    [provider]  Blood Glucose Monitoring Suppl (ACCU-CHEK GUIDE ME) w/Device KIT 1 Piece by Does not apply route as directed. 11/12/20   Nida, Gebreselassie W, MD  docusate sodium  (COLACE CLEAR) 50 MG capsule Take 1 capsule (50 mg total) by mouth daily as needed for mild constipation. As needed for constipation associated with iron supplement 04/11/20   Lamon Pleasant HERO, PA-C  ferrous sulfate  325 (65 FE) MG EC tablet Take 1 tablet (325 mg total) by mouth daily with breakfast. 04/11/20   Lamon Pleasant M, PA-C  fluticasone-salmeterol (ADVAIR DISKUS) 250-50 MCG/ACT AEPB Place 1 puff into the nose in the morning and at bedtime.    [provider]  gabapentin (NEURONTIN) 300 MG capsule Take 300 mg by mouth 2 (two) times daily.    [provider]  GLOBAL EASE INJECT PEN NEEDLES 32G X 4 MM MISC USE TO INJECT INSULIN  4 TIMES DAILY. 10/21/21   Nida, Gebreselassie W, MD  hydrochlorothiazide (MICROZIDE) 12.5 MG capsule Take 12.5 mg by mouth daily.    [provider]  ibuprofen  (ADVIL ) 800 MG tablet Take 1 tablet (800 mg total) by mouth every 8 (eight) hours as needed. 10/06/22   Margrette Taft BRAVO, MD  insulin  aspart (NOVOLOG ) 100 UNIT/ML FlexPen Inject  6-12 Units into the skin 3 (three) times daily before meals. 12/10/20   Nida, Gebreselassie W, MD  Insulin  Glargine Solostar (LANTUS ) 100 UNIT/ML Solostar Pen Inject 50 Units into the skin at bedtime. 06/19/21   Nida, Gebreselassie W, MD  lisinopril (PRINIVIL,ZESTRIL) 40 MG tablet Take 40 mg by mouth every evening.  12/17/14   [provider]  loratadine (CLARITIN) 10 MG tablet Take 10 mg by mouth every evening.    [provider]  meloxicam  (MOBIC ) 15 MG tablet Take 1 tablet (15 mg total) by mouth daily as needed for pain.  10/03/22   Silver Wonda LABOR, PA  metFORMIN (GLUCOPHAGE) 1000 MG tablet Take 1,000 mg by mouth 2 (two) times daily. 01/01/15   [provider]  ondansetron  (ZOFRAN ) 4 MG tablet TAKE 1 TABLET BY MOUTH EVERY 4 HOURS AS NEEDED FOR NAUSEA/ VOMITING Patient taking differently: Take 4 mg by mouth every 4 (four) hours as needed for nausea or vomiting. 07/23/17   Shirlean Therisa ORN, NP  pantoprazole (PROTONIX) 40 MG tablet Take 40 mg by mouth every evening.  01/01/15   [provider]  rosuvastatin (CRESTOR) 40 MG tablet Take 40 mg by mouth daily.    [provider]                                                                                                                                    Past Surgical History Past Surgical History:  Procedure Laterality Date   APPENDECTOMY     BIOPSY  05/27/2016   Procedure: BIOPSY;  Surgeon: Shaaron Lamar HERO, MD;  Location: [facility];  Service: Endoscopy;;  Duodenal, esophagus   BRAIN SURGERY     aneurysm, age 35   CEREBRAL ANEURYSM REPAIR     Second procedure was gamma knife repair   COLONOSCOPY WITH PROPOFOL  N/A 05/27/2016   Procedure: COLONOSCOPY WITH PROPOFOL ;  Surgeon: Shaaron Lamar HERO, MD;  Location: [facility];  Service: Endoscopy;  Laterality: N/A;  1230    ESOPHAGOGASTRODUODENOSCOPY (EGD) WITH PROPOFOL  N/A 05/27/2016   Procedure: ESOPHAGOGASTRODUODENOSCOPY (EGD) WITH PROPOFOL ;  Surgeon: Shaaron Lamar HERO, MD;  Location: [facility];  Service: Endoscopy;  Laterality: N/A;   FRACTURE SURGERY     left wrist   GIVENS CAPSULE STUDY N/A 06/08/2016   Procedure: GIVENS CAPSULE STUDY;  Surgeon: Shaaron Lamar HERO, MD;  Location: [facility];  Service: Endoscopy;  Laterality: N/A;  7:30am. Patient to arrive at 7:00am   HERNIA REPAIR     left inguinal and umbilical   KNEE SURGERY Left    twice   POLYPECTOMY  05/27/2016   Procedure: POLYPECTOMY;  Surgeon: Shaaron Lamar HERO, MD;  Location: [facility];  Service: Endoscopy;;  recto-sigmoid  polyp   ROTATOR CUFF REPAIR Left    x2   Family History Family History  Problem Relation Age of Onset   Colon cancer Neg Hx  Social History Social History   Tobacco Use   Smoking status: Never   Smokeless tobacco: Never  Vaping Use   Vaping status: Never Used  Substance Use Topics   Alcohol use: No   Drug use: No   Allergies Morphine and codeine   Review of Systems Review of Systems  All other systems reviewed and are negative.   Physical Exam Vital Signs  I have reviewed the triage vital signs BP (!) 149/86   Pulse 81   Temp 98.6 F (37 C) (Oral)   Resp 18   Ht 5' 11 (1.803 m)   Wt 88.5 kg   SpO2 95%   BMI 27.20 kg/m  Physical Exam Vitals and nursing note reviewed.  Constitutional:      General: He is not in acute distress.    Appearance: Normal appearance.  HENT:     Mouth/Throat:     Mouth: Mucous membranes are moist.  Eyes:     Conjunctiva/sclera: Conjunctivae normal.  Cardiovascular:     Rate and Rhythm: Normal rate and regular rhythm.  Pulmonary:     Effort: Pulmonary effort is normal. No respiratory distress.     Breath sounds: Normal breath sounds.  Abdominal:     General: Abdomen is flat.     Palpations: Abdomen is soft.     Tenderness: There is no abdominal tenderness.  Musculoskeletal:     Right lower leg: No edema.     Left lower leg: No edema.  Skin:    General: Skin is warm and dry.     Capillary Refill: Capillary refill takes less than 2 seconds.  Neurological:     Mental Status: He is alert and oriented to person, place, and time. Mental status is at baseline.     Comments: Cranial nerves II through XII intact, strength 5 out of 5 in the bilateral upper and lower extremities, no sensory deficit to light touch, no dysmetria on finger-nose-finger testing, ambulatory with steady gait.  Psychiatric:        Mood and Affect: Mood normal.        Behavior: Behavior normal.     ED Results and Treatments Labs (all labs ordered  are listed, but only abnormal results are displayed) Labs Reviewed  RESP PANEL BY RT-PCR (RSV, FLU A&B, COVID)  RVPGX2  CBC WITH DIFFERENTIAL/PLATELET  COMPREHENSIVE METABOLIC PANEL WITH GFR  PROTIME-INR                                                                                                                          Radiology No results found.  Pertinent labs & imaging results that were available during my care of the patient were reviewed by me and considered in my medical decision making (see MDM for details).  Medications Ordered in ED Medications  prochlorperazine (COMPAZINE) injection 10 mg (has no administration in time range)  diphenhydrAMINE (BENADRYL) injection 12.5 mg (has no administration in time range)  acetaminophen  (TYLENOL ) tablet 1,000 mg (  has no administration in time range)  lactated ringers  bolus 1,000 mL (has no administration in time range)                                                                                                                                     Procedures Procedures  (including critical care time)  Medical Decision Making / ED Course   MDM:  68 year old presenting to the emergency department with headache.  Patient overall well-appearing, physical examination with normal neurologic exam.  Vital signs are reassuring.  Given persistent symptoms, history of aneurysm, will obtain CTA head and neck although seems less consistent with aneurysm.  Symptoms have been present for 2 weeks.  Also considered venous sinus thrombosis given persistent symptoms, will check CT venogram.  Low concern for intracranial bleeding but will check CT head.  Doubt other process such as meningitis or CNS infection, no meningismus, no fevers or infectious symptoms.  Will treat with headache cocktail.  Will reassess.      Additional history obtained: -Additional history obtained from {wsadditionalhistorian:28072} -External records from outside source  obtained and reviewed including: Chart review including previous notes, labs, imaging, consultation notes including ***   Lab Tests: -I ordered, reviewed, and interpreted labs.   The pertinent results include:   Labs Reviewed  RESP PANEL BY RT-PCR (RSV, FLU A&B, COVID)  RVPGX2  CBC WITH DIFFERENTIAL/PLATELET  COMPREHENSIVE METABOLIC PANEL WITH GFR  PROTIME-INR    Notable for ***  EKG   EKG Interpretation Date/Time:    Ventricular Rate:    PR Interval:    QRS Duration:    QT Interval:    QTC Calculation:   R Axis:      Text Interpretation:           Imaging Studies ordered: I ordered imaging studies including *** On my interpretation imaging demonstrates *** I independently visualized and interpreted imaging. I agree with the radiologist interpretation   Medicines ordered and prescription drug management: Meds ordered this encounter  Medications   prochlorperazine (COMPAZINE) injection 10 mg   diphenhydrAMINE (BENADRYL) injection 12.5 mg   acetaminophen  (TYLENOL ) tablet 1,000 mg   lactated ringers  bolus 1,000 mL    -I have reviewed the patients home medicines and have made adjustments as needed   Consultations Obtained: I requested consultation with the ***,  and discussed lab and imaging findings as well as pertinent plan - they recommend: ***   Cardiac Monitoring: The patient was maintained on a cardiac monitor.  I personally viewed and interpreted the cardiac monitored which showed an underlying rhythm of: ***  Social Determinants of Health:  Diagnosis or treatment significantly limited by social determinants of health: {wssoc:28071}   Reevaluation: After the interventions noted above, I reevaluated the patient and found that their symptoms have {resolved/improved/worsened:23923::improved}  Co morbidities that complicate the patient evaluation  Past Medical History:  Diagnosis Date   Arthritis  CHF (congestive heart failure) (HCC)    COPD  (chronic obstructive pulmonary disease) (HCC)    Diabetes mellitus    GERD (gastroesophageal reflux disease)    Headache    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   History of kidney stones    HOH (hard of hearing)    Hyperlipidemia    Hypertension    IDA (iron deficiency anemia)    Myocardial infarction (HCC)    2008   Peptic ulcer disease    Seizures (HCC)    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 knife surgery 2008   Sleep apnea    Stroke [facility])    Due to brain aneurysm      Dispostion: Disposition decision including need for hospitalization was considered, and patient {wsdispo:28070::discharged from emergency department.}    Final Clinical Impression(s) / ED Diagnoses Final diagnoses:  None     This chart was dictated using voice recognition software.  Despite best efforts to proofread,  errors can occur which can change the documentation meaning.

## 2023-11-18 NOTE — ED Provider Notes (Signed)
 Care assumed from Dr. Francesca, patient with new onset headaches. Headache is improved with headache cocktail. Workup is significant for intracranial stenoses. Dr. Vanessa is going to review the imaging and call me with recommendations.  Dr. Vanessa recommends hospitalization to get MRI and also be evaluated by neurologic IR to see if there is treatment advised for his intracranial stenoses.  Case is discussed with Dr. Manfred of [facility], who agrees to admit the patient.   Raford Lenis, MD 11/19/23 LARI

## 2023-11-18 NOTE — ED Notes (Signed)
 Pt stated headache is much better and he is going to try to get some rest

## 2023-11-19 ENCOUNTER — Inpatient Hospital Stay (HOSPITAL_COMMUNITY)

## 2023-11-19 DIAGNOSIS — I63411 Cerebral infarction due to embolism of right middle cerebral artery: Secondary | ICD-10-CM

## 2023-11-19 DIAGNOSIS — R29701 NIHSS score 1: Secondary | ICD-10-CM | POA: Diagnosis not present

## 2023-11-19 DIAGNOSIS — K219 Gastro-esophageal reflux disease without esophagitis: Secondary | ICD-10-CM

## 2023-11-19 DIAGNOSIS — Z794 Long term (current) use of insulin: Secondary | ICD-10-CM

## 2023-11-19 DIAGNOSIS — G441 Vascular headache, not elsewhere classified: Secondary | ICD-10-CM | POA: Diagnosis not present

## 2023-11-19 DIAGNOSIS — M25512 Pain in left shoulder: Secondary | ICD-10-CM | POA: Diagnosis not present

## 2023-11-19 DIAGNOSIS — M7918 Myalgia, other site: Secondary | ICD-10-CM | POA: Diagnosis not present

## 2023-11-19 DIAGNOSIS — R519 Headache, unspecified: Secondary | ICD-10-CM | POA: Diagnosis present

## 2023-11-19 DIAGNOSIS — Z7401 Bed confinement status: Secondary | ICD-10-CM | POA: Diagnosis not present

## 2023-11-19 DIAGNOSIS — E1165 Type 2 diabetes mellitus with hyperglycemia: Secondary | ICD-10-CM | POA: Diagnosis not present

## 2023-11-19 DIAGNOSIS — G44201 Tension-type headache, unspecified, intractable: Secondary | ICD-10-CM

## 2023-11-19 DIAGNOSIS — R748 Abnormal levels of other serum enzymes: Secondary | ICD-10-CM | POA: Diagnosis present

## 2023-11-19 DIAGNOSIS — E782 Mixed hyperlipidemia: Secondary | ICD-10-CM

## 2023-11-19 DIAGNOSIS — I10 Essential (primary) hypertension: Secondary | ICD-10-CM | POA: Diagnosis not present

## 2023-11-19 LAB — GLUCOSE, CAPILLARY
Glucose-Capillary: 276 mg/dL — ABNORMAL HIGH (ref 70–99)
Glucose-Capillary: 347 mg/dL — ABNORMAL HIGH (ref 70–99)

## 2023-11-19 LAB — CBG MONITORING, ED
Glucose-Capillary: 295 mg/dL — ABNORMAL HIGH (ref 70–99)
Glucose-Capillary: 228 mg/dL — ABNORMAL HIGH (ref 70–99)
Glucose-Capillary: 249 mg/dL — ABNORMAL HIGH (ref 70–99)

## 2023-11-19 LAB — HEMOGLOBIN A1C
Hgb A1c MFr Bld: 12.1 % — ABNORMAL HIGH (ref 4.8–5.6)
Mean Plasma Glucose: 300.57 mg/dL

## 2023-11-19 MED ORDER — PANTOPRAZOLE SODIUM 40 MG PO TBEC
40.0000 mg | DELAYED_RELEASE_TABLET | Freq: Every evening | ORAL | Status: DC
  Administered 2023-11-19: 40 mg via ORAL
  Filled 2023-11-19: qty 1

## 2023-11-19 MED ORDER — ASPIRIN 81 MG PO CHEW
81.0000 mg | CHEWABLE_TABLET | Freq: Every day | ORAL | Status: DC
  Administered 2023-11-19 – 2023-11-20 (×2): 81 mg via ORAL
  Filled 2023-11-19 (×2): qty 1

## 2023-11-19 MED ORDER — STROKE: EARLY STAGES OF RECOVERY BOOK
Freq: Once | Status: AC
  Filled 2023-11-19: qty 1

## 2023-11-19 MED ORDER — INSULIN ASPART 100 UNIT/ML IJ SOLN
0.0000 [IU] | Freq: Every day | INTRAMUSCULAR | Status: DC
  Administered 2023-11-19: 3 [IU] via SUBCUTANEOUS
  Administered 2023-11-19: 4 [IU] via SUBCUTANEOUS
  Filled 2023-11-19: qty 1
  Filled 2023-11-19: qty 5

## 2023-11-19 MED ORDER — LISINOPRIL 20 MG PO TABS
40.0000 mg | ORAL_TABLET | Freq: Every evening | ORAL | Status: DC
  Administered 2023-11-19: 40 mg via ORAL
  Filled 2023-11-19: qty 2

## 2023-11-19 MED ORDER — GADOBUTROL 1 MMOL/ML IV SOLN
7.5000 mL | Freq: Once | INTRAVENOUS | Status: AC | PRN
  Administered 2023-11-19: 7.5 mL via INTRAVENOUS

## 2023-11-19 MED ORDER — ROSUVASTATIN CALCIUM 20 MG PO TABS
40.0000 mg | ORAL_TABLET | Freq: Every day | ORAL | Status: DC
  Administered 2023-11-19: 40 mg via ORAL
  Filled 2023-11-19: qty 2

## 2023-11-19 MED ORDER — BUTALBITAL-APAP-CAFFEINE 50-325-40 MG PO TABS
2.0000 | ORAL_TABLET | Freq: Four times a day (QID) | ORAL | Status: DC | PRN
  Administered 2023-11-19: 2 via ORAL
  Filled 2023-11-19: qty 2

## 2023-11-19 MED ORDER — MUSCLE RUB 10-15 % EX CREA: TOPICAL_CREAM | CUTANEOUS | Status: DC | PRN

## 2023-11-19 MED ORDER — INSULIN GLARGINE-YFGN 100 UNIT/ML ~~LOC~~ SOLN
10.0000 [IU] | Freq: Every day | SUBCUTANEOUS | Status: DC
  Filled 2023-11-19 (×2): qty 0.1

## 2023-11-19 MED ORDER — CYCLOBENZAPRINE HCL 10 MG PO TABS
10.0000 mg | ORAL_TABLET | Freq: Every day | ORAL | Status: DC
  Administered 2023-11-19: 10 mg via ORAL
  Filled 2023-11-19: qty 1

## 2023-11-19 MED ORDER — INSULIN GLARGINE-YFGN 100 UNIT/ML ~~LOC~~ SOLN
20.0000 [IU] | SUBCUTANEOUS | Status: DC
  Administered 2023-11-19 – 2023-11-20 (×2): 20 [IU] via SUBCUTANEOUS
  Filled 2023-11-19 (×4): qty 0.2

## 2023-11-19 MED ORDER — INSULIN ASPART 100 UNIT/ML IJ SOLN
0.0000 [IU] | Freq: Three times a day (TID) | INTRAMUSCULAR | Status: DC
  Administered 2023-11-19 (×2): 5 [IU] via SUBCUTANEOUS
  Administered 2023-11-19: 8 [IU] via SUBCUTANEOUS
  Administered 2023-11-20 (×3): 3 [IU] via SUBCUTANEOUS
  Filled 2023-11-19: qty 3
  Filled 2023-11-19 (×2): qty 1
  Filled 2023-11-19: qty 3
  Filled 2023-11-19: qty 8
  Filled 2023-11-19 (×2): qty 3

## 2023-11-19 NOTE — TOC CM/SW Note (Signed)
 Transition of Care [facility]) - Inpatient Brief Assessment   Patient Details  Name: Dustin Roberts MRN: [id number] Date of Birth: 26-Jun-1955  Transition of Care [facility] CM/SW Contact:    Lucie Lunger, LCSWA Phone Number: 11/19/2023, 9:28 AM   Clinical Narrative: Transition of Care Department [facility]) has reviewed patient and no TOC needs have been identified at this time. We will continue to monitor patient advancement through interdiciplinary progression rounds. If new patient transition needs arise, please place a TOC consult.  Transition of Care Asessment: Insurance and Status: Insurance coverage has been reviewed Patient has primary care physician: Yes Home environment has been reviewed: From home Prior level of function:: Independent Prior/Current Home Services: No current home services Social Drivers of Health Review: SDOH reviewed no interventions necessary Readmission risk has been reviewed: Yes Transition of care needs: no transition of care needs at this time

## 2023-11-19 NOTE — Consult Note (Signed)
 NEUROLOGY CONSULT NOTE   Date of service: November 19, 2023 Patient Name: Dustin Roberts MRN:  [id number] DOB:  December 14, 1955 Chief Complaint: Severe headaches x 2 weeks Requesting Provider: Evonnie Lenis, MD  History of Present Illness  Dustin Roberts is a 68 y.o. male with hx of intracranial aneurysm rupture, hypertension, hyperlipidemia, diabetes and GERD who presents with a 2-week history of severe headache.  Patient states that headache was gradual in onset and currently rates it about a 2 out of 10 in the back of his head spreading to the neck and left shoulder.  He states that the headache is sharp in nature and has fluctuated in severity being a 10+ at its worst points.  Patient endorses an episode of nausea in the past week but no other symptoms. 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  LKW: Unclear Modified rankin score: 1-No significant post stroke disability and can perform usual duties with stroke symptoms IV Thrombolysis: No, outside of window EVT: No, outside of window  NIHSS components Score: Comment  1a Level of Conscious 0[x]  1[]  2[]  3[]      1b LOC Questions 0[x]  1[]  2[]       1c LOC Commands 0[x]  1[]  2[]       2 Best Gaze 0[x]  1[]  2[]       3 Visual 0[x]  1[]  2[]  3[]      4 Facial Palsy 0[x]  1[]  2[]  3[]      5a Motor Arm - left 0[x]  1[]  2[]  3[]  4[]  UN[]    5b Motor Arm - Right 0[x]  1[]  2[]  3[]  4[]  UN[]    6a Motor Leg - Left 0[x]  1[]  2[]  3[]  4[]  UN[]    6b Motor Leg - Right 0[x]  1[]  2[]  3[]  4[]  UN[]    7 Limb Ataxia 0[x]  1[]  2[]  UN[]      8 Sensory 0[]  1[x]  2[]  UN[]      9 Best Language 0[x]  1[]  2[]  3[]      10 Dysarthria 0[x]  1[]  2[]  UN[]      11 Extinct. 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   History of kidney stones    HOH (hard of hearing)    Hyperlipidemia    Hypertension    IDA (iron deficiency anemia)    Myocardial infarction (HCC)    2008   Peptic ulcer disease    Seizures (HCC)    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 knife surgery 2008   Sleep apnea    Stroke [facility])    Due to brain aneurysm    Past Surgical History:  Procedure Laterality Date   APPENDECTOMY     BIOPSY  05/27/2016   Procedure: BIOPSY;  Surgeon: Shaaron Lamar HERO, MD;  [facility];  Service: Endoscopy;;  Duodenal, esophagus   BRAIN SURGERY     aneurysm, age 56   CEREBRAL ANEURYSM REPAIR     Second procedure was gamma knife repair  COLONOSCOPY WITH PROPOFOL  N/A 05/27/2016   Procedure: COLONOSCOPY WITH PROPOFOL ;  Surgeon: Shaaron Lamar HERO, MD;  [facility];  Service: Endoscopy;  Laterality: N/A;  1230    ESOPHAGOGASTRODUODENOSCOPY (EGD) WITH PROPOFOL  N/A 05/27/2016   Procedure: ESOPHAGOGASTRODUODENOSCOPY (EGD) WITH PROPOFOL ;  Surgeon: Shaaron Lamar HERO, MD;  [facility];  Service: Endoscopy;  Laterality: N/A;   FRACTURE SURGERY     left wrist   GIVENS CAPSULE STUDY N/A 06/08/2016   Procedure: GIVENS CAPSULE STUDY;  Surgeon: Shaaron Lamar HERO, MD;  [facility];  Service: Endoscopy;  Laterality: N/A;  7:30am. Patient to arrive at 7:00am   HERNIA REPAIR     left inguinal and umbilical   KNEE SURGERY Left    twice   POLYPECTOMY  05/27/2016   Procedure: POLYPECTOMY;  Surgeon: Shaaron Lamar HERO, MD;  [facility];  Service: Endoscopy;;  recto-sigmoid polyp   ROTATOR CUFF REPAIR Left    x2    Family History: Family History  Problem Relation Age of Onset   Colon  cancer Neg Hx     Social History  reports that he has never smoked. He has never used smokeless tobacco. He reports that he does not drink alcohol and does not use drugs.  Allergies  Allergen Reactions   Morphine And Codeine      hallucinations    Medications   Current Facility-Administered Medications:    butalbital-acetaminophen -caffeine (FIORICET) 50-325-40 MG per tablet 2 tablet, 2 tablet, Oral, Q6H PRN, Adefeso, Oladapo, DO   insulin  aspart (novoLOG ) injection 0-15 Units, 0-15 Units, Subcutaneous, TID WC, Adefeso, Oladapo, DO, 5 Units at 11/19/23 1244   insulin  aspart (novoLOG ) injection 0-5 Units, 0-5 Units, Subcutaneous, QHS, Adefeso, Oladapo, DO, 3 Units at 11/19/23 0147   insulin  glargine-yfgn (SEMGLEE ) injection 20 Units, 20 Units, Subcutaneous, Q24H, Tat, David, MD   lisinopril (ZESTRIL) tablet 40 mg, 40 mg, Oral, QPM, Adefeso, Oladapo, DO, 40 mg at 11/19/23 1524   pantoprazole (PROTONIX) EC tablet 40 mg, 40 mg, Oral, QPM, Adefeso, Oladapo, DO, 40 mg at 11/19/23 1523   rosuvastatin (CRESTOR) tablet 40 mg, 40 mg, Oral, Daily, Adefeso, Oladapo, DO, 40 mg at 11/19/23 1524  Vitals   Vitals:   11/19/23 0230 11/19/23 0500 11/19/23 0530 11/19/23 1440  BP: (!) 143/73  135/77 (!) 151/95  Pulse: (!) 57 65 (!) 57 72  Resp: 16  18   Temp:    97.8 F (36.6 C)  TempSrc:    Oral  SpO2: 96% 95% 96% 96%  Weight:      Height:        Body mass index is 27.2 kg/m.   Physical Exam   Constitutional: Appears well-developed and well-nourished.  Psych: Affect appropriate to situation.  Eyes: No scleral injection.  HENT: No OP obstruction.  Hard of hearing Head: Normocephalic.  Respiratory: Effort normal, non-labored breathing.  Skin: WDI.   Neurologic Examination    NEURO:  Mental Status: Alert and oriented to person, place, time and situation, able to give clear and coherent history of present illness.   Speech/Language: speech is without dysarthria or aphasia.    Cranial  Nerves:  II: PERRL. Visual fields full.  III, IV, VI: EOMI. Eyelids elevate symmetrically.  V: Sensation is intact to light touch and symmetrical to face.  VII: Smile is symmetrical.  VIII: hearing intact to voice. IX, X: Phonation is normal.  KP:Dynloizm shrug 5/5. XII: tongue is midline without fasciculations. Motor: Able to move all 4  extremities with good, symmetrical antigravity strength Tone: is normal and bulk is normal Sensation- Intact to light touch bilaterally but diminished in bilateral calves and feet to light touch and vibration Coordination: FTN intact bilaterally Gait- deferred    Labs/Imaging/Neurodiagnostic studies   CBC:  Recent Labs  Lab 2023-12-14 2050  WBC 8.3  NEUTROABS 5.7  HGB 14.8  HCT 44.0  MCV 82.2  PLT 184   Basic Metabolic Panel:  Lab Results  Component Value Date   NA 135 12/14/23   K 4.1 Dec 14, 2023   CO2 26 12/14/23   GLUCOSE 334 (H) 2023/12/14   BUN 14 12/14/2023   CREATININE 0.76 12/14/23   CALCIUM 9.7 12/14/23   GFRNONAA >60 2023/12/14   GFRAA >60 05/26/2016   Lipid Panel:  Lab Results  Component Value Date   LDLCALC 105 (H) 03/17/2021   HgbA1c:  Lab Results  Component Value Date   HGBA1C 12.1 (H) 12-14-2023   Urine Drug Screen: No results found for: LABOPIA, COCAINSCRNUR, LABBENZ, AMPHETMU, THCU, LABBARB  Alcohol Level No results found for: [facility] INR  Lab Results  Component Value Date   INR 1.0 12/14/2023   CT Head without contrast(Personally reviewed): No acute abnormality  CT angio Head and Neck with contrast(Personally reviewed): Moderate to severe stenoses about supraclinoid left ICA, proximal left M1 segment, right MCA bifurcation/proximal M2 segment and bilateral P2 segments, possibly reflecting changes of vasculitis or RCVS versus progressive atherosclerotic disease  MRI Brain(Personally reviewed): Small foci of acute and subacute infarcts in superior right parietal lobe and right centrum  semiovale  ASSESSMENT   Dustin Roberts is a 68 y.o. male with hx of intracranial aneurysm rupture, hypertension, hyperlipidemia, diabetes and GERD who presents with a 2-week history of headache which was gradual in onset and fluctuating in severity, sometimes reaching 10/10 intensity and sharp in nature.  CT angiogram demonstrates multifocal intracranial arterial stenoses with sausage on a string pattern noted in the left MCA.  2 small acute to subacute ischemic strokes were seen on MRI.  Taken together, this could indicate a diagnosis of RCVS, although RCVS 2 score is equivocal at 3.  There is no known exposure to a vasoconstrictive agent as patient reports taking no medications at home, but will send urine drug screen to rule out exposure to cocaine or amphetamines, although this is unlikely.  Would in any case recommend usual stroke workup, treatment of headache pain and keeping systolic blood pressure less than 180.  Could consider angiogram as diagnosis of RCVS is not certain.  RECOMMENDATIONS  -Keep systolic blood pressure less than 180, may use as needed labetalol or hydralazine - Urine drug screen - TTE  - Check A1c and LDL + add statin per guidelines - Aspirin 81 mg daily antiplt/anticoag - q4 hr neuro checks - STAT head CT for any change in neuro exam - Tele - PT/OT/SLP - Stroke education - Amb referral to neurology upon discharge   ______________________________________________________________________  Patient seen by NP and then by MD, MD to edit note as needed.  Signed, Cortney E Everitt Clint Kill, NP [facility]   NEUROHOSPITALIST ADDENDUM Performed a face to face diagnostic evaluation.   I have reviewed the contents of history and physical exam as documented by PA/ARNP/Resident and agree with above documentation.  I have discussed and formulated the above plan as documented. Edits to the note have been made as needed.  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 With the noted strokes, will need stroke workup.  In addition to above, will give flexeril 10mg  tonight along with Bengay cream. Would also benefit from outpatient neck PT.  Ivana Nicastro, MD [facility] [phone]   If 7pm to 7am, please call on call as listed on AMION.

## 2023-11-19 NOTE — Care Management Obs Status (Signed)
 MEDICARE OBSERVATION STATUS NOTIFICATION   Patient Details  Name: Dustin Roberts MRN: [id number] Date of Birth: 02/09/1955   Medicare Observation Status Notification Given:       [name], RN 11/19/2023, 3:14 PM

## 2023-11-19 NOTE — Plan of Care (Signed)
  Problem: Education: Goal: Ability to describe self-care measures that may prevent or decrease complications (Diabetes Survival Skills Education) will improve Outcome: Progressing   Problem: Coping: Goal: Ability to adjust to condition or change in health will improve Outcome: Progressing   Problem: Health Behavior/Discharge Planning: Goal: Ability to identify and utilize available resources and services will improve Outcome: Progressing

## 2023-11-19 NOTE — Care Management CC44 (Signed)
 Condition Code 44 Documentation Completed  Patient Details  Name: Dustin Roberts MRN: [id number] Date of Birth: May 10, 1955   Condition Code 44 given:  Yes Patient signature on Condition Code 44 notice:  Yes Documentation of 2 MD's agreement:  Yes Code 44 added to claim:  Yes    [name], RN 11/19/2023, 3:16 PM

## 2023-11-19 NOTE — H&P (Signed)
 History and Physical    Patient: Dustin Roberts FMW:[id number] DOB: 11-13-1955 DOA: 11/18/2023 DOS: the patient was seen and examined on 11/19/2023 PCP: Gerome Tillman CROME, FNP  Patient coming from: Home  Chief Complaint:  Chief Complaint  Patient presents with   Headache   HPI: Dustin Roberts is a 68 y.o. male with medical history significant of hypertension, hyperlipidemia, T2DM, GERD who presents to the emergency department due to 2-week onset of constant headache.  He went to [facility] in [place], [place] , he was treated for migraine and was discharged home.  He had a transitory relief of the headache, but this has since returned.  Headache is more on the back of the head.  He also complained of left anterior-superior chest (close up to left shoulder) pain.  He denies any fall or trauma, numbness, tingling or any extremity weakness.  ED course In the emergency department, BP was 150/97, other vital signs were within normal range.  Workup in the ED showed normal CBC and BMP except for chloride of 96 and blood glucose of 334, ALP 138.  Respiratory panel was negative. CT head without contrast showed no acute intraocular abnormality CT angiography head and neck was negative for acute LVO and was concerning for multifocal multivessel intracranial stenosis. Patient was treated with Tylenol , Benadryl, Compazine.  IV hydration was provided. Neurologist (Dr. Vanessa) was consulted and recommended MRI brain and the patient should be admitted to [facility] for further evaluation and management by neurology team.    Review of Systems: As mentioned in the history of present illness. All other systems reviewed and are negative. 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 knife surgery 2008   Sleep apnea    Stroke [facility])    Due to brain aneurysm   Past Surgical History:  Procedure Laterality Date   APPENDECTOMY     BIOPSY  05/27/2016   Procedure: BIOPSY;  Surgeon: Shaaron Lamar HERO, MD;  [facility];  Service: Endoscopy;;  Duodenal, esophagus   BRAIN SURGERY     aneurysm, age 55   CEREBRAL ANEURYSM REPAIR     Second procedure was gamma knife repair   COLONOSCOPY WITH PROPOFOL  N/A 05/27/2016   Procedure: COLONOSCOPY WITH PROPOFOL ;  Surgeon: Shaaron Lamar HERO, MD;  [facility];  Service: Endoscopy;  Laterality: N/A;  1230    ESOPHAGOGASTRODUODENOSCOPY (EGD) WITH PROPOFOL  N/A 05/27/2016   Procedure: ESOPHAGOGASTRODUODENOSCOPY (EGD) WITH PROPOFOL ;  Surgeon: Shaaron Lamar HERO, MD;  [facility];  Service: Endoscopy;  Laterality: N/A;   FRACTURE SURGERY     left wrist   GIVENS CAPSULE STUDY N/A 06/08/2016   Procedure: GIVENS CAPSULE STUDY;  Surgeon: Shaaron Lamar HERO, MD;  [facility];  Service: Endoscopy;  Laterality: N/A;  7:30am. Patient to arrive at 7:00am   HERNIA REPAIR     left inguinal and umbilical  KNEE SURGERY Left    twice   POLYPECTOMY  05/27/2016   Procedure: POLYPECTOMY;  Surgeon: Shaaron Lamar HERO, MD;  [facility];  Service: Endoscopy;;  recto-sigmoid polyp   ROTATOR CUFF REPAIR Left    x2   Social History:  reports that he has never smoked. He has never used smokeless tobacco. He reports that he does not drink alcohol and does not use drugs.  Allergies  Allergen Reactions   Morphine And Codeine      hallucinations    Family History  Problem Relation Age of Onset   Colon cancer Neg Hx     Prior to Admission medications    Medication Sig Start Date End Date Taking? Authorizing Provider  ACCU-CHEK GUIDE test strip USE TO MONITOR GLUCOSE 4 TIMES DAILY. 10/21/21   Nida, Gebreselassie W, MD  atorvastatin (LIPITOR) 80 MG tablet Take 80 mg by mouth daily.    [provider]  Blood Glucose Monitoring Suppl (ACCU-CHEK GUIDE ME) w/Device KIT 1 Piece by Does not apply route as directed. 11/12/20   Nida, Gebreselassie W, MD  docusate sodium  (COLACE CLEAR) 50 MG capsule Take 1 capsule (50 mg total) by mouth daily as needed for mild constipation. As needed for constipation associated with iron supplement 04/11/20   Lamon Pleasant HERO, PA-C  ferrous sulfate  325 (65 FE) MG EC tablet Take 1 tablet (325 mg total) by mouth daily with breakfast. 04/11/20   Lamon Pleasant M, PA-C  fluticasone-salmeterol (ADVAIR DISKUS) 250-50 MCG/ACT AEPB Place 1 puff into the nose in the morning and at bedtime.    [provider]  gabapentin (NEURONTIN) 300 MG capsule Take 300 mg by mouth 2 (two) times daily.    [provider]  GLOBAL EASE INJECT PEN NEEDLES 32G X 4 MM MISC USE TO INJECT INSULIN  4 TIMES DAILY. 10/21/21   Nida, Gebreselassie W, MD  hydrochlorothiazide (MICROZIDE) 12.5 MG capsule Take 12.5 mg by mouth daily.    [provider]  ibuprofen  (ADVIL ) 800 MG tablet Take 1 tablet (800 mg total) by mouth every 8 (eight) hours as needed. 10/06/22   Margrette Taft BRAVO, MD  insulin  aspart (NOVOLOG ) 100 UNIT/ML FlexPen Inject 6-12 Units into the skin 3 (three) times daily before meals. 12/10/20   Nida, Gebreselassie W, MD  Insulin  Glargine Solostar (LANTUS ) 100 UNIT/ML Solostar Pen Inject 50 Units into the skin at bedtime. 06/19/21   Nida, Gebreselassie W, MD  lisinopril (PRINIVIL,ZESTRIL) 40 MG tablet Take 40 mg by mouth every evening.  12/17/14   [provider]  loratadine (CLARITIN) 10 MG tablet Take 10 mg by mouth every evening.    [provider]  meloxicam  (MOBIC ) 15 MG tablet Take 1  tablet (15 mg total) by mouth daily as needed for pain. 10/03/22   Silver Wonda LABOR, PA  metFORMIN (GLUCOPHAGE) 1000 MG tablet Take 1,000 mg by mouth 2 (two) times daily. 01/01/15   [provider]  ondansetron  (ZOFRAN ) 4 MG tablet TAKE 1 TABLET BY MOUTH EVERY 4 HOURS AS NEEDED FOR NAUSEA/ VOMITING Patient taking differently: Take 4 mg by mouth every 4 (four) hours as needed for nausea or vomiting. 07/23/17   Shirlean Therisa ORN, NP  pantoprazole (PROTONIX) 40 MG tablet Take 40 mg by mouth every evening.  01/01/15   [provider]  rosuvastatin (CRESTOR) 40 MG tablet Take 40 mg by mouth daily.    [provider]    Physical Exam: Vitals:   11/18/23 2050 11/18/23 2115 11/18/23  2215 11/18/23 2230  BP: (!) 149/86 (!) 151/81 (!) 152/88 (!) 159/99  Pulse: 81 100 72 70  Resp: 18 20 16 16   Temp:  98.5 F (36.9 C)    TempSrc:  Oral    SpO2: 95% 95% 97% 96%  Weight:      Height:       General: Elderly male. Awake and alert and oriented x3. Not in any acute distress.  HEENT: NCAT.  PERRLA. EOMI. Sclerae anicteric.  Moist mucosal membranes. Neck: Neck supple without lymphadenopathy. No carotid bruits. No masses palpated.  Cardiovascular: Regular rate with normal S1-S2 sounds. No murmurs, rubs or gallops auscultated. No JVD.  Respiratory: Clear breath sounds.  No accessory muscle use. Abdomen: Soft, nontender, nondistended. Active bowel sounds. No masses or hepatosplenomegaly  Skin: No rashes, lesions, or ulcerations.  Dry, warm to touch. Musculoskeletal: Tender to palpation of left ant-sup chest.  2+ dorsalis pedis and radial pulses. Good ROM.  No contractures  Psychiatric: Intact judgment and insight.  Mood appropriate to current condition. Neurologic: No focal neurological deficits. Strength is 5/5 x 4.  CN II - XII grossly intact.  Data Reviewed: There are no new results to review at this time.  Assessment and Plan: Persistent headache, r/o RCVS vs PACNS Patient was  treated with Tylenol , Benadryl, Compazine Continue Fioricet MRI of brain without contrast will be done per  Neurologist's recommendation Neurologist recommended admitting patient to [facility] for further evaluation and management.  Please notify neurology team when patient arrives to [facility]  Type II diabetes mellitus with hyperglycemia Continue Semglee  10 units nightly and high-dose dose accordingly Continue ISS and hypoglycemia protocol  GERD Continue Protonix  Essential hypertension Continue lisinopril  Mixed hyperlipidemia Continue Crestor   Advance Care Planning: Full code  Consults: Neurology by [facility]  Family Communication: None at bedside  Severity of Illness: The appropriate patient status for this patient is INPATIENT. Inpatient status is judged to be reasonable and necessary in order to provide the required intensity of service to ensure the patient's safety. The patient's presenting symptoms, physical exam findings, and initial radiographic and laboratory data in the context of their chronic comorbidities is felt to place them at high risk for further clinical deterioration. Furthermore, it is not anticipated that the patient will be medically stable for discharge from the hospital within 2 midnights of admission.   * I certify that at the point of admission it is my clinical judgment that the patient will require inpatient hospital care spanning beyond 2 midnights from the point of admission due to high intensity of service, high risk for further deterioration and high frequency of surveillance required.*  Author: Jaclynn Laumann, DO 11/19/2023 12:11 AM  For on call review [email or web address].

## 2023-11-19 NOTE — Progress Notes (Signed)
 PROGRESS NOTE  Dustin Roberts FMW:[id number] DOB: November 19, 1955 DOA: 11/18/2023 PCP: Gerome Tillman CROME, FNP  Brief History:  68 year old male with history of hypertension, hyperlipidemia, diabetes mellitus type 2, GERD, prior aneurysmal rupture presenting with 2 weeks of constant headache.  The patient states that he went to the ED at [facility] and was treated for migraine.  He was discharged with hydrocodone .  He states that his headache was somewhat controlled, but has been persistent.  In the past few days prior to this admission he states that his headache was worse.  He describes his headaches mostly in the occipital region.  He denies any fevers, chills, visual disturbance, focal extremity weakness, dysesthesias. At the same time, the patient has been describing some neck pain radiating to his left shoulder area.  He denies any injury or trauma.  He denies any new medications.  In the ED, the patient had low-grade temperature 99.0 F.  He was hemodynamically stable.  Oxygen saturation was 95% room air.  WBC 8.3, hemoglobin 14.8, platelets 184.  Sodium 135, potassium 4.1, bicarbonate 26, serum creatinine 0.76.  LFTs were unremarkable. CT brain negative for acute findings.  CTA head and neck was negative for LVO.  There was intracranial stenosis noted.  Neurology was consulted and recommended transfer to [facility] for further evaluation and treatment.  MRI of the brain has been ordered.   Assessment/Plan: Intractable headache -There is concern for reversible cerebral vasoconstriction syndrome versus primary CNS vasculitis - Appreciate neurology - MRI brain - No focal neurologic deficits on exam  Neck pain/left shoulder pain - MRI neck  Essential hypertension - Holding lisinopril and HCTZ temporarily  Diabetes mellitus type 2, uncontrolled with hyperglycemia - Check hemoglobin A1c - 10/20/2020 hemoglobin A1c 12.0 - NovoLog  sliding scale - Reduce dose Semglee  - Holding  metformin  Mixed hyperlipidemia - Continue statin          Family Communication: no  Family at bedside  Consultants:  neurology  Code Status:  FULL   DVT Prophylaxis:  SCDs   Procedures: As Listed in Progress Note Above  Antibiotics: None  RN Pressure Injury Documentation:        Subjective: Patient complains of a mild headache in the posterior occiput area.  He has some neck pain and left shoulder pain.  He denies any fevers, chills, chest pain, shortness breath, nausea, vomiting, diarrhea, domino pain, visual disturbance.  There is no focal extremity weakness.  Objective: Vitals:   11/19/23 0200 11/19/23 0230 11/19/23 0500 11/19/23 0530  BP: (!) 152/73 (!) 143/73  135/77  Pulse: (!) 56 (!) 57 65 (!) 57  Resp: 18 16  18   Temp: 99 F (37.2 C)     TempSrc:      SpO2: 96% 96% 95% 96%  Weight:      Height:       No intake or output data in the 24 hours ending 11/19/23 0728 Weight change:  Exam:  General:  Pt is alert, follows commands appropriately, not in acute distress HEENT: No icterus, No thrush, No neck mass, [place]/AT Cardiovascular: RRR, S1/S2, no rubs, no gallops Respiratory: CTA bilaterally, no wheezing, no crackles, no rhonchi Abdomen: Soft/+BS, non tender, non distended, no guarding Extremities: No edema, No lymphangitis, No petechiae, No rashes, no synovitis Neuro:  CN II-XII intact, strength 4/5 in RUE, RLE, strength 4/5 LUE, LLE; sensation intact bilateral; no dysmetria; babinski equivocal     Data Reviewed: I  have personally reviewed following labs and imaging studies Basic Metabolic Panel: Recent Labs  Lab 11/18/23 2050  NA 135  K 4.1  CL 96*  CO2 26  GLUCOSE 334*  BUN 14  CREATININE 0.76  CALCIUM 9.7   Liver Function Tests: Recent Labs  Lab 11/18/23 2050  AST 17  ALT 17  ALKPHOS 138*  BILITOT 0.7  PROT 7.8  ALBUMIN 4.2   No results for input(s): LIPASE, AMYLASE in the last 168 hours. No results for input(s):  AMMONIA in the last 168 hours. Coagulation Profile: Recent Labs  Lab 11/18/23 2050  INR 1.0   CBC: Recent Labs  Lab 11/18/23 2050  WBC 8.3  NEUTROABS 5.7  HGB 14.8  HCT 44.0  MCV 82.2  PLT 184   Cardiac Enzymes: No results for input(s): CKTOTAL, CKMB, CKMBINDEX, TROPONINI in the last 168 hours. BNP: Invalid input(s): POCBNP CBG: Recent Labs  Lab 11/19/23 0139  GLUCAP 295*   HbA1C: No results for input(s): HGBA1C in the last 72 hours. Urine analysis:    Component Value Date/Time   COLORURINE YELLOW 07/21/2016 1042   APPEARANCEUR CLEAR 07/21/2016 1042   LABSPEC 1.037 (H) 07/21/2016 1042   PHURINE 5.5 07/21/2016 1042   GLUCOSEU NEGATIVE 07/21/2016 1042   HGBUR NEGATIVE 07/21/2016 1042   BILIRUBINUR NEGATIVE 07/21/2016 1042   KETONESUR TRACE (A) 07/21/2016 1042   PROTEINUR TRACE (A) 07/21/2016 1042   UROBILINOGEN 0.2 06/15/2006 1739   NITRITE NEGATIVE 07/21/2016 1042   LEUKOCYTESUR NEGATIVE 07/21/2016 1042   Sepsis Labs: @LABRCNTIP (procalcitonin:4,lacticidven:4) ) Recent Results (from the past 240 hours)  Resp panel by RT-PCR (RSV, Flu A&B, Covid) Anterior Nasal Swab     Status: None   Collection Time: 11/18/23  6:16 PM   Specimen: Anterior Nasal Swab  Result Value Ref Range Status   SARS Coronavirus 2 by RT PCR NEGATIVE NEGATIVE Final    Comment: (NOTE) SARS-CoV-2 target nucleic acids are NOT DETECTED.  The SARS-CoV-2 RNA is generally detectable in upper respiratory specimens during the acute phase of infection. The lowest concentration of SARS-CoV-2 viral copies this assay can detect is 138 copies/mL. A negative result does not preclude SARS-Cov-2 infection and should not be used as the sole basis for treatment or other patient management decisions. A negative result may occur with  improper specimen collection/handling, submission of specimen other than nasopharyngeal swab, presence of viral mutation(s) within the areas targeted by this  assay, and inadequate number of viral copies(<138 copies/mL). A negative result must be combined with clinical observations, patient history, and epidemiological information. The expected result is Negative.  Fact Sheet for Patients:  bloggercourse.com  Fact Sheet for Healthcare Providers:  seriousbroker.it  This test is no t yet approved or cleared by the [place]  FDA and  has been authorized for detection and/or diagnosis of SARS-CoV-2 by FDA under an Emergency Use Authorization (EUA). This EUA will remain  in effect (meaning this test can be used) for the duration of the COVID-19 declaration under Section 564(b)(1) of the Act, 21 U.S.C.section 360bbb-3(b)(1), unless the authorization is terminated  or revoked sooner.       Influenza A by PCR NEGATIVE NEGATIVE Final   Influenza B by PCR NEGATIVE NEGATIVE Final    Comment: (NOTE) The Xpert Xpress SARS-CoV-2/FLU/RSV plus assay is intended as an aid in the diagnosis of influenza from Nasopharyngeal swab specimens and should not be used as a sole basis for treatment. Nasal washings and aspirates are unacceptable for Xpert Xpress SARS-CoV-2/FLU/RSV testing.  Fact Sheet for Patients: bloggercourse.com  Fact Sheet for Healthcare Providers: seriousbroker.it  This test is not yet approved or cleared by the [place]  FDA and has been authorized for detection and/or diagnosis of SARS-CoV-2 by FDA under an Emergency Use Authorization (EUA). This EUA will remain in effect (meaning this test can be used) for the duration of the COVID-19 declaration under Section 564(b)(1) of the Act, 21 U.S.C. section 360bbb-3(b)(1), unless the authorization is terminated or revoked.     Resp Syncytial Virus by PCR NEGATIVE NEGATIVE Final    Comment: (NOTE) Fact Sheet for Patients: bloggercourse.com  Fact Sheet for  Healthcare Providers: seriousbroker.it  This test is not yet approved or cleared by the [place]  FDA and has been authorized for detection and/or diagnosis of SARS-CoV-2 by FDA under an Emergency Use Authorization (EUA). This EUA will remain in effect (meaning this test can be used) for the duration of the COVID-19 declaration under Section 564(b)(1) of the Act, 21 U.S.C. section 360bbb-3(b)(1), unless the authorization is terminated or revoked.  Performed at [facility], [address], [place], [place] [address]      Scheduled Meds:  insulin  aspart  0-15 Units Subcutaneous TID WC   insulin  aspart  0-5 Units Subcutaneous QHS   insulin  glargine-yfgn  10 Units Subcutaneous QHS   Continuous Infusions:  Procedures/Studies: CT ANGIO HEAD NECK W WO CM Result Date: 11/18/2023 CLINICAL DATA:  Initial evaluation for acute headache. EXAM: CT ANGIOGRAPHY HEAD AND NECK WITH AND WITHOUT CONTRAST TECHNIQUE: Multidetector CT imaging of the head and neck was performed using the standard protocol during bolus administration of intravenous contrast. Multiplanar CT image reconstructions and MIPs were obtained to evaluate the vascular anatomy. Carotid stenosis measurements (when applicable) are obtained utilizing NASCET criteria, using the distal internal carotid diameter as the denominator. RADIATION DOSE REDUCTION: This exam was performed according to the departmental dose-optimization program which includes automated exposure control, adjustment of the mA and/or kV according to patient size and/or use of iterative reconstruction technique. CONTRAST:  75mL OMNIPAQUE  IOHEXOL  350 MG/ML SOLN COMPARISON:  Prior study from 03/30/2022. FINDINGS: CT HEAD FINDINGS Brain: Postoperative changes from prior left sided craniotomy. Chronic encephalomalacia within the underlying left frontotemporal region. Few remote lacunar infarcts present about the bilateral basal ganglia. No acute  intracranial hemorrhage. No acute large vessel territory infarct. No mass lesion or midline shift. Ex vacuo dilatation of the left lateral ventricle without hydrocephalus. No extra-axial fluid collection. Vascular: No abnormal hyperdense vessel. Scattered vascular calcifications noted within the carotid siphons. Skull: Scalp soft tissues within normal limits. Prior left craniotomy. Calvarium otherwise intact. Sinuses/Orbits: Globes orbital soft tissues within normal limits. Scattered mucosal thickening present about the ethmoidal air cells. No significant mastoid effusion. Other: None. Review of the MIP images confirms the above findings CTA NECK FINDINGS Aortic arch: Visualized aortic arch within normal limits for caliber with standard branch pattern. Aortic atherosclerosis. No significant stenosis about the origin the great vessels. Right carotid system: Right common and internal carotid arteries are patent without dissection. Mild atheromatous change about the right carotid bulb without stenosis. Left carotid system: Left common and internal carotid arteries are patent without dissection. Mild atheromatous change about the left carotid bulb without stenosis. Vertebral arteries: Left vertebral artery arises directly from the aortic arch. Right vertebral artery dominant. Vertebral arteries are patent without stenosis or dissection. Skeleton: No worrisome osseous lesions. 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 Moderate spondylosis at C5-6 and C6-7. Patient is edentulous. Other neck: No other  acute finding. Upper chest: No other acute finding. Review of the MIP images confirms the above findings CTA HEAD FINDINGS Anterior circulation: Atheromatous irregularity about the carotid siphons bilaterally. There is a new/progressive moderate stenosis involving the supraclinoid left ICA (series 15, image 109). A1 segments patent bilaterally, with the left being hypoplastic. Normal anterior communicating artery complex.  ACAs patent to their distal aspects without significant stenosis. No M1 occlusion. New and/or progressive severe stenoses about the proximal left M1 segment (series 21, image 19). As well as the right MCA bifurcation/proximal M2 segment (series 21, image 19). No large vessel occlusion. Distal MCA branches perfused and fairly symmetric, although demonstrate scattered atheromatous change, most notable about a distal left M3 branch (series 21, image 17). Posterior circulation: Both V4 segments patent without stenosis. Right vertebral artery dominant. Both PICA grossly patent at their origins. Basilar patent without stenosis. Superior cerebral arteries patent bilaterally. Both PCAs primarily supplied via the basilar. Multifocal severe bilateral P2 stenoses, mildly progressed from prior. PCAs remain patent to their distal aspects. Venous sinuses: Patent allowing for timing the contrast bolus. Anatomic variants: As above.  No aneurysm. Review of the MIP images confirms the above findings IMPRESSION: CT HEAD: 1. No acute intracranial abnormality. 2. Postoperative changes from prior left craniotomy with chronic encephalomalacia within the underlying left frontotemporal region. 3. Few remote lacunar infarcts about the bilateral basal ganglia. CTA HEAD AND NECK: 1. Negative CTA for acute large vessel occlusion. 2. New and/or progressive moderate to severe stenoses about the supraclinoid left ICA, proximal left M1 segment, right MCA bifurcation/proximal M2 segment, and bilateral P2 segments as above. While these findings could reflect progressive atherosclerotic disease, possible changes of vasculitis and/or RCVS could also be considered given the history of headache. 3. Mild atheromatous change about the carotid bifurcations. No hemodynamically significant stenosis within the neck. Aortic Atherosclerosis (ICD10-I70.0). Electronically Signed   By: Morene Hoard M.D.   On: 11/18/2023 23:25   CT VENOGRAM HEAD Result  Date: 11/18/2023 CLINICAL DATA:  Initial evaluation for dural venous sinus thrombosis suspected. EXAM: CT VENOGRAM HEAD TECHNIQUE: Venographic phase images of the brain were obtained following the administration of intravenous contrast. Multiplanar reformats and maximum intensity projections were generated. RADIATION DOSE REDUCTION: This exam was performed according to the departmental dose-optimization program which includes automated exposure control, adjustment of the mA and/or kV according to patient size and/or use of iterative reconstruction technique. CONTRAST:  75mL OMNIPAQUE  IOHEXOL  350 MG/ML SOLN COMPARISON:  Concomitant CT performed at the same time. FINDINGS: Normal enhancement seen throughout the superior sagittal sinus to the torcula. Transverse and sigmoid sinuses are patent as are the jugular bulbs and visualized proximal internal jugular veins. Straight sinus, vein of Galen, and internal cerebral veins are patent. No evidence for dural venous sinus thrombosis. No appreciable cortical vein abnormality. No appreciable abnormality about the cavernous sinus. IMPRESSION: Negative CT venogram. No evidence for dural venous sinus thrombosis. Electronically Signed   By: Morene Hoard M.D.   On: 11/18/2023 23:02    Alm Schneider, DO  [facility]  If 7PM-7AM, please contact night-coverage [email or web address] Password [email or web address] 11/19/2023, 7:28 AM   LOS: 0 days

## 2023-11-19 NOTE — Hospital Course (Addendum)
 68 year old male with history of hypertension, hyperlipidemia, diabetes mellitus type 2, GERD, prior aneurysmal rupture presented to hospital with constant headache despite being treated for migraine at the outside hospital.  Patient describes the headache being worse mostly in the occipital region with some neck pain radiation towards the shoulder.  In the ED patient had temperature of 99 F.  No leukocytosis.  CT of the brain was negative for acute findings.    Patient was then admitted hospital for further evaluation and treatment.  Intractable headache  Headache has improved at this time.  Patient was seen by neurology.  MRI of the brain shows infarcts.  CT venogram was negative for dural venous sinus thrombosis.  CT angiogram of the head and neck was negative for large vessel occlusion but had some stenosis.  Neurology recommends blood pressure goal less than 180.  LDL of 135.  Hemoglobin A1c at 12.1.  Continue aspirin.  Get PT OT speech therapy evaluation.  Will need ambulatory neurology follow-up on discharge.  On Fioricet.  Acute/subacute infarcts.  Neurology on board.  Workup undergoing.  PT OT speech therapy.  Check 2D echocardiogram patient continue aspirin Lipitor.  Follow neurology recommendations.  Neck pain/left shoulder pain MRI of the cervical spine showed multilevel cervical spondylosis with left foraminal narrowing on C5-C6.  MRI of the brain shows a small foci of acute and subacute infarct in the superior right parietal lobe and right centrum semiovale.  Local Flexeril and Bengay cream.   Essential hypertension Has been started on lisinopril.   Diabetes mellitus type 2, uncontrolled with hyperglycemia Latest hemoglobin A1c of 12.1.  Continue sliding scale insulin , on Semglee  reduced dose.  Continue to hold metformin.  Latest blood glucose level of 165.   Mixed hyperlipidemia Continue Crestor

## 2023-11-19 NOTE — Progress Notes (Signed)
 PT arrived on the unit WITHOUT REPORT from transfer facility. A&O x4, call light in reach. Ambulated to bed. Admissions paged for orders.

## 2023-11-20 ENCOUNTER — Observation Stay (HOSPITAL_COMMUNITY)

## 2023-11-20 ENCOUNTER — Other Ambulatory Visit: Payer: Self-pay | Admitting: Neurology

## 2023-11-20 DIAGNOSIS — M5412 Radiculopathy, cervical region: Secondary | ICD-10-CM | POA: Diagnosis not present

## 2023-11-20 DIAGNOSIS — G4486 Cervicogenic headache: Secondary | ICD-10-CM | POA: Diagnosis not present

## 2023-11-20 DIAGNOSIS — I5030 Unspecified diastolic (congestive) heart failure: Secondary | ICD-10-CM | POA: Diagnosis not present

## 2023-11-20 DIAGNOSIS — I639 Cerebral infarction, unspecified: Secondary | ICD-10-CM | POA: Insufficient documentation

## 2023-11-20 LAB — BASIC METABOLIC PANEL WITH GFR
Anion gap: 10 (ref 5–15)
BUN: 17 mg/dL (ref 8–23)
CO2: 27 mmol/L (ref 22–32)
Calcium: 9.5 mg/dL (ref 8.9–10.3)
Chloride: 103 mmol/L (ref 98–111)
Creatinine, Ser: 0.79 mg/dL (ref 0.61–1.24)
GFR, Estimated: >60 mL/min
Glucose, Bld: 160 mg/dL — ABNORMAL HIGH (ref 70–99)
Potassium: 3.5 mmol/L (ref 3.5–5.1)
Sodium: 140 mmol/L (ref 135–145)

## 2023-11-20 LAB — ECHOCARDIOGRAM COMPLETE
Area-P 1/2: 3.01 cm2
Calc EF: 64.7 %
Height: 71 in
MV VTI: 2.92 cm2
S' Lateral: 3.2 cm
Single Plane A2C EF: 64.4 %
Single Plane A4C EF: 63.3 %
Weight: 3120 [oz_av]

## 2023-11-20 LAB — RAPID URINE DRUG SCREEN, HOSP PERFORMED
Amphetamines: NOT DETECTED
Barbiturates: POSITIVE — AB
Benzodiazepines: NOT DETECTED
Cocaine: NOT DETECTED
Opiates: NOT DETECTED
Tetrahydrocannabinol: NOT DETECTED

## 2023-11-20 LAB — CBC
HCT: 39.1 % (ref 39.0–52.0)
Hemoglobin: 13.7 g/dL (ref 13.0–17.0)
MCH: 28.1 pg (ref 26.0–34.0)
MCHC: 35 g/dL (ref 30.0–36.0)
MCV: 80.1 fL (ref 80.0–100.0)
Platelets: 107 K/uL — ABNORMAL LOW (ref 150–400)
RBC: 4.88 MIL/uL (ref 4.22–5.81)
RDW: 11.6 % (ref 11.5–15.5)
WBC: 5.8 K/uL (ref 4.0–10.5)
nRBC: 0 % (ref 0.0–0.2)

## 2023-11-20 LAB — GLUCOSE, CAPILLARY
Glucose-Capillary: 165 mg/dL — ABNORMAL HIGH (ref 70–99)
Glucose-Capillary: 151 mg/dL — ABNORMAL HIGH (ref 70–99)
Glucose-Capillary: 182 mg/dL — ABNORMAL HIGH (ref 70–99)

## 2023-11-20 LAB — LIPID PANEL
Cholesterol: 189 mg/dL (ref 0–200)
HDL: 30 mg/dL — ABNORMAL LOW
LDL Cholesterol: 135 mg/dL — ABNORMAL HIGH (ref 0–99)
Total CHOL/HDL Ratio: 6.3 ratio
Triglycerides: 121 mg/dL
VLDL: 24 mg/dL (ref 0–40)

## 2023-11-20 LAB — MAGNESIUM: Magnesium: 1.9 mg/dL (ref 1.7–2.4)

## 2023-11-20 MED ORDER — METFORMIN HCL 500 MG PO TABS: 500.0000 mg | ORAL_TABLET | Freq: Two times a day (BID) | ORAL | 2 refills | Status: AC

## 2023-11-20 MED ORDER — MUSCLE RUB 10-15 % EX CREA: 1.0000 | TOPICAL_CREAM | CUTANEOUS | 0 refills | Status: AC | PRN

## 2023-11-20 MED ORDER — CYCLOBENZAPRINE HCL 5 MG PO TABS: 5.0000 mg | ORAL_TABLET | Freq: Three times a day (TID) | ORAL | 0 refills | Status: AC | PRN

## 2023-11-20 MED ORDER — LANCET DEVICE MISC: 1.0000 | 0 refills | Status: DC

## 2023-11-20 MED ORDER — CLOPIDOGREL BISULFATE 75 MG PO TABS
75.0000 mg | ORAL_TABLET | Freq: Every day | ORAL | Status: DC
  Administered 2023-11-20: 75 mg via ORAL
  Filled 2023-11-20: qty 1

## 2023-11-20 MED ORDER — CLOPIDOGREL BISULFATE 75 MG PO TABS: 75.0000 mg | ORAL_TABLET | Freq: Every day | ORAL | 0 refills | Status: AC

## 2023-11-20 MED ORDER — BLOOD GLUCOSE MONITORING SUPPL DEVI: 1.0000 | 0 refills | Status: DC

## 2023-11-20 MED ORDER — BUTALBITAL-APAP-CAFFEINE 50-325-40 MG PO TABS: 1.0000 | ORAL_TABLET | Freq: Four times a day (QID) | ORAL | 0 refills | Status: AC | PRN

## 2023-11-20 MED ORDER — LISINOPRIL 10 MG PO TABS: 10.0000 mg | ORAL_TABLET | Freq: Every day | ORAL | 2 refills | Status: AC

## 2023-11-20 MED ORDER — INSULIN GLARGINE 100 UNIT/ML SOLOSTAR PEN: 20.0000 [IU] | PEN_INJECTOR | Freq: Every day | SUBCUTANEOUS | 0 refills | Status: AC

## 2023-11-20 MED ORDER — ROSUVASTATIN CALCIUM 20 MG PO TABS: 20.0000 mg | ORAL_TABLET | Freq: Every day | ORAL | 2 refills | Status: AC

## 2023-11-20 MED ORDER — INSULIN PEN NEEDLE 32G X 8 MM MISC: 0 refills | Status: DC

## 2023-11-20 MED ORDER — BLOOD GLUCOSE TEST VI STRP: 1.0000 | ORAL_STRIP | 0 refills | Status: DC

## 2023-11-20 MED ORDER — ASPIRIN 81 MG PO CHEW: 81.0000 mg | CHEWABLE_TABLET | Freq: Every day | ORAL | 3 refills | Status: DC

## 2023-11-20 MED ORDER — ROSUVASTATIN CALCIUM 20 MG PO TABS
20.0000 mg | ORAL_TABLET | Freq: Every day | ORAL | Status: DC
  Administered 2023-11-20: 20 mg via ORAL
  Filled 2023-11-20: qty 1

## 2023-11-20 MED ORDER — LANCETS MISC: 1.0000 | 0 refills | Status: DC

## 2023-11-20 NOTE — Discharge Summary (Signed)
 Physician Discharge Summary  Dustin Roberts FMW:[id number] DOB: 12/03/1955 DOA: 11/18/2023  PCP: Gerome Tillman CROME, FNP  Admit date: 11/18/2023 Discharge date: 11/20/2023  Admitted From: Home  Discharge disposition: Home   Recommendations for Outpatient Follow-Up:   Follow up with your primary care provider in one week.  Check CBC, BMP, magnesium in the next visit Discussed with your primary care provider regarding adequate diabetes control.  You have been started on insulin  please continue to take it at home and closely monitor your blood glucose levels. Ambulatory follow-up with neurology has been requested   Discharge Diagnosis:   Principal Problem:   Headache Active Problems:   Uncontrolled type 2 diabetes mellitus with hyperglycemia, with long-term current use of insulin  (HCC)   Stroke (cerebrum) (HCC)  Discharge Condition: Improved.  Diet recommendation:   Carbohydrate-modified.    Wound care: None.  Code status: Full.   History of Present Illness:   68 year old male with history of hypertension, hyperlipidemia, diabetes mellitus type 2, GERD, prior aneurysmal rupture presented to hospital with constant headache despite being treated for migraine at the outside hospital. Patient describes the headache being worse mostly in the occipital region with some neck pain radiation towards the shoulder. In the ED patient had temperature of 99 F. No leukocytosis. CT of the brain was negative for acute findings. Patient was then admitted hospital for further evaluation and treatment.    Hospital Course:   Following conditions were addressed during hospitalization as listed below,  Intractable headache  Headache has improved at this time.  Patient was seen by neurology.  MRI of the brain shows infarcts.  CT venogram was negative for dural venous sinus thrombosis.  CT angiogram of the head and neck was negative for large vessel occlusion but had some stenosis.  Neurology  recommends blood pressure goal less than 180.  LDL of 135.  Hemoglobin A1c at 12.1.  Continue aspirin and Plavix for 3 months and subsequently aspirin alone.  Seen by PT OT and speech therapy during hospitalization..  Had follow-up with [facility] neurology as outpatient  Acute/subacute infarcts.  Neurology saw the patient during hospitalization.    2D echocardiogram with preserved LV function.  Patient continue aspirin, Plavix and Lipitor.  Communicated with neurology prior to discharge.  Neck pain/left shoulder pain MRI of the cervical spine showed multilevel cervical spondylosis with left foraminal narrowing on C5-C6.  MRI of the brain shows a small foci of acute and subacute infarct in the superior right parietal lobe and right centrum semiovale.  Local Flexeril and Bengay cream on discharge.   Essential hypertension Will continue lisinopril 10 mg daily on discharge.   Diabetes mellitus type 2, uncontrolled with hyperglycemia Latest hemoglobin A1c of 12.1.  Communicated with the patient as well as patient's wife about it.  Patient used to be on insulin  in the past but he has not been using it.  I have started on Semglee  20 units at nighttime and metformin twice daily for now.  Will need to be titrated as outpatient.  Lifestyle modifications and adequate monitoring of blood glucose levels were explained to the patient   Mixed hyperlipidemia Continue Crestor   Disposition.  At this time, patient is stable for disposition home with outpatient PCP and neurology follow-up.  Spoke with the patient's spouse prior to disposition.  Medical Consultants:   Neurology Procedures:    None Subjective:   Today, patient was seen and examined at bedside.  Denies any dizziness lightheadedness shortness of breath.  No focal weaknesses.  Headache is better today.  Discharge Exam:   Vitals:   11/20/23 0752 11/20/23 1531  BP: 96/62 131/73  Pulse: 61 68  Resp: 18 18  Temp: 97.6 F (36.4 C) 97.8 F  (36.6 C)  SpO2: 98% 97%   Vitals:   11/19/23 2031 11/20/23 0559 11/20/23 0752 11/20/23 1531  BP: (!) 141/79 128/82 96/62 131/73  Pulse: 70 64 61 68  Resp:  17 18 18   Temp: 97.7 F (36.5 C) (!) 97.5 F (36.4 C) 97.6 F (36.4 C) 97.8 F (36.6 C)  TempSrc: Oral Oral Oral Oral  SpO2: 100% 99% 98% 97%  Weight:      Height:       Body mass index is 27.2 kg/m.  General: Alert awake, not in obvious distress HENT: pupils equally reacting to light,  No scleral pallor or icterus noted. Oral mucosa is moist.  Chest:  Clear breath sounds.  Diminished breath sounds bilaterally. No crackles or wheezes.  CVS: S1 &S2 heard. No murmur.  Regular rate and rhythm. Abdomen: Soft, nontender, nondistended.  Bowel sounds are heard.   Extremities: No cyanosis, clubbing or edema.  Peripheral pulses are palpable. Psych: Alert, awake and oriented, normal mood CNS:  No cranial nerve deficits.  Power equal in all extremities.   Skin: Warm and dry.  No rashes noted.  The results of significant diagnostics from this hospitalization (including imaging, microbiology, ancillary and laboratory) are listed below for reference.     Diagnostic Studies:   MR CERVICAL SPINE WO CONTRAST Result Date: 11/19/2023 EXAM: MRI CERVICAL SPINE WITHOUT CONTRAST 11/19/2023 08:50:48 AM TECHNIQUE: Multiplanar multisequence MRI of the cervical spine was performed. COMPARISON: Cervical spine CT 03/30/22. 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 Moderate degenerative changes of the C1-C2 articulation. Normal vertebral body heights. Bone marrow signal is unremarkable. SPINAL CORD: Normal spinal cord size. No abnormal spinal cord signal. SOFT TISSUES: No paraspinal mass. C2-C3: Congenital nonsegmentation. C3-C4: Right greater than left facet arthropathy and uncovertebral joint spurring result in moderate right neural foraminal  narrowing. C4-C5: Mild bilateral facet arthropathy. C5-C6: Moderate disc height loss, small disc bulge without significant spinal canal stenosis. Left greater than right facet arthropathy and uncovertebral joint spurring result in severe left neural foraminal narrowing. C6-C7: Moderate disc height loss, small disc bulge without significant spinal canal stenosis. Facet arthropathy and uncovertebral joint spurring contribute to moderate bilateral neural foraminal narrowing. C7-T1: Right greater than left facet arthropathy. No spinal canal stenosis or neural foraminal narrowing. IMPRESSION: 1. Multilevel cervical spondylosis with the most severe involvement at C5-6, where severe left neural foraminal narrowing is present. 2. At C3-4, moderate right neural foraminal narrowing. 3. At C6-7, moderate bilateral neural foraminal narrowing. Electronically signed by: Ryan Chess MD 11/19/2023 09:04 AM EST RP Workstation: [id number]   MR BRAIN W WO CONTRAST Result Date: 11/19/2023 EXAM: MRI BRAIN WITH AND WITHOUT CONTRAST 11/19/2023 08:50:48 AM TECHNIQUE: Multiplanar multisequence MRI of the head/brain was performed with and without the administration of 7.5 mL gadobutrol (GADAVIST) 1 MMOL/ML injection. COMPARISON: CT head 11/18/2023. CLINICAL HISTORY: Headache, increasing frequency or severity. FINDINGS: BRAIN AND VENTRICLES: Small foci of acute and subacute infarcts in the superior right parietal lobe and right centrum semiovale. 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 No acute intracranial hemorrhage. No mass effect or midline shift. No hydrocephalus. The sella is unremarkable. Normal flow voids. No mass or  abnormal enhancement. Prior left frontoparietal craniotomy. Background of mild to moderate chronic small vessel disease. ORBITS: No acute abnormality. SINUSES: No acute abnormality. BONES AND SOFT TISSUES: Normal bone marrow signal and enhancement. No acute soft tissue abnormality.  IMPRESSION: 1. Small foci of acute and subacute infarcts in the superior right parietal lobe and right centrum semiovale. 2. 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 Electronically signed by: Ryan Chess MD 11/19/2023 09:01 AM EST RP Workstation: [id number]   CT ANGIO HEAD NECK W WO CM Result Date: 11/18/2023 CLINICAL DATA:  Initial evaluation for acute headache. EXAM: CT ANGIOGRAPHY HEAD AND NECK WITH AND WITHOUT CONTRAST TECHNIQUE: Multidetector CT imaging of the head and neck was performed using the standard protocol during bolus administration of intravenous contrast. Multiplanar CT image reconstructions and MIPs were obtained to evaluate the vascular anatomy. Carotid stenosis measurements (when applicable) are obtained utilizing NASCET criteria, using the distal internal carotid diameter as the denominator. RADIATION DOSE REDUCTION: This exam was performed according to the departmental dose-optimization program which includes automated exposure control, adjustment of the mA and/or kV according to patient size and/or use of iterative reconstruction technique. CONTRAST:  75mL OMNIPAQUE  IOHEXOL  350 MG/ML SOLN COMPARISON:  Prior study from 03/30/2022. FINDINGS: CT HEAD FINDINGS Brain: Postoperative changes from prior left sided craniotomy. Chronic encephalomalacia within the underlying left frontotemporal region. Few remote lacunar infarcts present about the bilateral basal ganglia. No acute intracranial hemorrhage. No acute large vessel territory infarct. No mass lesion or midline shift. Ex vacuo dilatation of the left lateral ventricle without hydrocephalus. No extra-axial fluid collection. Vascular: No abnormal hyperdense vessel. Scattered vascular calcifications noted within the carotid siphons. Skull: Scalp soft tissues within normal limits. Prior left craniotomy. Calvarium otherwise intact. Sinuses/Orbits: Globes orbital soft tissues within normal limits. Scattered mucosal  thickening present about the ethmoidal air cells. No significant mastoid effusion. Other: None. Review of the MIP images confirms the above findings CTA NECK FINDINGS Aortic arch: Visualized aortic arch within normal limits for caliber with standard branch pattern. Aortic atherosclerosis. No significant stenosis about the origin the great vessels. Right carotid system: Right common and internal carotid arteries are patent without dissection. Mild atheromatous change about the right carotid bulb without stenosis. Left carotid system: Left common and internal carotid arteries are patent without dissection. Mild atheromatous change about the left carotid bulb without stenosis. Vertebral arteries: Left vertebral artery arises directly from the aortic arch. Right vertebral artery dominant. Vertebral arteries are patent without stenosis or dissection. Skeleton: No worrisome osseous lesions. 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 Moderate spondylosis at C5-6 and C6-7. Patient is edentulous. Other neck: No other acute finding. Upper chest: No other acute finding. Review of the MIP images confirms the above findings CTA HEAD FINDINGS Anterior circulation: Atheromatous irregularity about the carotid siphons bilaterally. There is a new/progressive moderate stenosis involving the supraclinoid left ICA (series 15, image 109). A1 segments patent bilaterally, with the left being hypoplastic. Normal anterior communicating artery complex. ACAs patent to their distal aspects without significant stenosis. No M1 occlusion. New and/or progressive severe stenoses about the proximal left M1 segment (series 21, image 19). As well as the right MCA bifurcation/proximal M2 segment (series 21, image 19). No large vessel occlusion. Distal MCA branches perfused and fairly symmetric, although demonstrate scattered atheromatous change, most notable about a distal left M3 branch (series 21, image 17). Posterior circulation: Both V4 segments  patent without stenosis. Right vertebral artery dominant. Both PICA grossly patent at their  origins. Basilar patent without stenosis. Superior cerebral arteries patent bilaterally. Both PCAs primarily supplied via the basilar. Multifocal severe bilateral P2 stenoses, mildly progressed from prior. PCAs remain patent to their distal aspects. Venous sinuses: Patent allowing for timing the contrast bolus. Anatomic variants: As above.  No aneurysm. Review of the MIP images confirms the above findings IMPRESSION: CT HEAD: 1. No acute intracranial abnormality. 2. Postoperative changes from prior left craniotomy with chronic encephalomalacia within the underlying left frontotemporal region. 3. Few remote lacunar infarcts about the bilateral basal ganglia. CTA HEAD AND NECK: 1. Negative CTA for acute large vessel occlusion. 2. New and/or progressive moderate to severe stenoses about the supraclinoid left ICA, proximal left M1 segment, right MCA bifurcation/proximal M2 segment, and bilateral P2 segments as above. While these findings could reflect progressive atherosclerotic disease, possible changes of vasculitis and/or RCVS could also be considered given the history of headache. 3. Mild atheromatous change about the carotid bifurcations. No hemodynamically significant stenosis within the neck. Aortic Atherosclerosis (ICD10-I70.0). Electronically Signed   By: Morene Hoard M.D.   On: 11/18/2023 23:25   CT VENOGRAM HEAD Result Date: 11/18/2023 CLINICAL DATA:  Initial evaluation for dural venous sinus thrombosis suspected. EXAM: CT VENOGRAM HEAD TECHNIQUE: Venographic phase images of the brain were obtained following the administration of intravenous contrast. Multiplanar reformats and maximum intensity projections were generated. RADIATION DOSE REDUCTION: This exam was performed according to the departmental dose-optimization program which includes automated exposure control, adjustment of the mA and/or kV  according to patient size and/or use of iterative reconstruction technique. CONTRAST:  75mL OMNIPAQUE  IOHEXOL  350 MG/ML SOLN COMPARISON:  Concomitant CT performed at the same time. FINDINGS: Normal enhancement seen throughout the superior sagittal sinus to the torcula. Transverse and sigmoid sinuses are patent as are the jugular bulbs and visualized proximal internal jugular veins. Straight sinus, vein of Galen, and internal cerebral veins are patent. No evidence for dural venous sinus thrombosis. No appreciable cortical vein abnormality. No appreciable abnormality about the cavernous sinus. IMPRESSION: Negative CT venogram. No evidence for dural venous sinus thrombosis. Electronically Signed   By: Morene Hoard M.D.   On: 11/18/2023 23:02     Labs:   Basic Metabolic Panel: Recent Labs  Lab 11/18/23 2050 11/20/23 0449  NA 135 140  K 4.1 3.5  CL 96* 103  CO2 26 27  GLUCOSE 334* 160*  BUN 14 17  CREATININE 0.76 0.79  CALCIUM 9.7 9.5  MG  --  1.9   GFR Estimated Creatinine Clearance: 91.5 mL/min (by C-G formula based on SCr of 0.79 mg/dL). Liver Function Tests: Recent Labs  Lab 11/18/23 2050  AST 17  ALT 17  ALKPHOS 138*  BILITOT 0.7  PROT 7.8  ALBUMIN 4.2   No results for input(s): LIPASE, AMYLASE in the last 168 hours. No results for input(s): AMMONIA in the last 168 hours. Coagulation profile Recent Labs  Lab 11/18/23 2050  INR 1.0    CBC: Recent Labs  Lab 11/18/23 2050 11/20/23 0449  WBC 8.3 5.8  NEUTROABS 5.7  --   HGB 14.8 13.7  HCT 44.0 39.1  MCV 82.2 80.1  PLT 184 107*   Cardiac Enzymes: No results for input(s): CKTOTAL, CKMB, CKMBINDEX, TROPONINI in the last 168 hours. BNP: Invalid input(s): POCBNP CBG: Recent Labs  Lab 11/19/23 1629 11/19/23 2118 11/20/23 0624 11/20/23 1138 11/20/23 1555  GLUCAP 276* 347* 165* 151* 182*   D-Dimer No results for input(s): DDIMER in the last 72 hours. Hgb A1c Recent  Labs     11/18/23 2050  HGBA1C 12.1*   Lipid Profile Recent Labs    11/20/23 0449  CHOL 189  HDL 30*  LDLCALC 135*  TRIG 121  CHOLHDL 6.3   Thyroid function studies No results for input(s): TSH, T4TOTAL, T3FREE, THYROIDAB in the last 72 hours.  Invalid input(s): FREET3 Anemia work up No results for input(s): VITAMINB12, FOLATE, FERRITIN, TIBC, IRON, RETICCTPCT in the last 72 hours. Microbiology Recent Results (from the past 240 hours)  Resp panel by RT-PCR (RSV, Flu A&B, Covid) Anterior Nasal Swab     Status: None   Collection Time: 11/18/23  6:16 PM   Specimen: Anterior Nasal Swab  Result Value Ref Range Status   SARS Coronavirus 2 by RT PCR NEGATIVE NEGATIVE Final    Comment: (NOTE) SARS-CoV-2 target nucleic acids are NOT DETECTED.  The SARS-CoV-2 RNA is generally detectable in upper respiratory specimens during the acute phase of infection. The lowest concentration of SARS-CoV-2 viral copies this assay can detect is 138 copies/mL. A negative result does not preclude SARS-Cov-2 infection and should not be used as the sole basis for treatment or other patient management decisions. A negative result may occur with  improper specimen collection/handling, submission of specimen other than nasopharyngeal swab, presence of viral mutation(s) within the areas targeted by this assay, and inadequate number of viral copies(<138 copies/mL). A negative result must be combined with clinical observations, patient history, and epidemiological information. The expected result is Negative.  Fact Sheet for Patients:  bloggercourse.com  Fact Sheet for Healthcare Providers:  seriousbroker.it  This test is no t yet approved or cleared by the [place]  FDA and  has been authorized for detection and/or diagnosis of SARS-CoV-2 by FDA under an Emergency Use Authorization (EUA). This EUA will remain  in effect (meaning this  test can be used) for the duration of the COVID-19 declaration under Section 564(b)(1) of the Act, 21 U.S.C.section 360bbb-3(b)(1), unless the authorization is terminated  or revoked sooner.       Influenza A by PCR NEGATIVE NEGATIVE Final   Influenza B by PCR NEGATIVE NEGATIVE Final    Comment: (NOTE) The Xpert Xpress SARS-CoV-2/FLU/RSV plus assay is intended as an aid in the diagnosis of influenza from Nasopharyngeal swab specimens and should not be used as a sole basis for treatment. Nasal washings and aspirates are unacceptable for Xpert Xpress SARS-CoV-2/FLU/RSV testing.  Fact Sheet for Patients: bloggercourse.com  Fact Sheet for Healthcare Providers: seriousbroker.it  This test is not yet approved or cleared by the [place]  FDA and has been authorized for detection and/or diagnosis of SARS-CoV-2 by FDA under an Emergency Use Authorization (EUA). This EUA will remain in effect (meaning this test can be used) for the duration of the COVID-19 declaration under Section 564(b)(1) of the Act, 21 U.S.C. section 360bbb-3(b)(1), unless the authorization is terminated or revoked.     Resp Syncytial Virus by PCR NEGATIVE NEGATIVE Final    Comment: (NOTE) Fact Sheet for Patients: bloggercourse.com  Fact Sheet for Healthcare Providers: seriousbroker.it  This test is not yet approved or cleared by the [place]  FDA and has been authorized for detection and/or diagnosis of SARS-CoV-2 by FDA under an Emergency Use Authorization (EUA). This EUA will remain in effect (meaning this test can be used) for the duration of the COVID-19 declaration under Section 564(b)(1) of the Act, 21 U.S.C. section 360bbb-3(b)(1), unless the authorization is terminated or revoked.  Performed at [facility], [address], [place], [place]  [address]      Discharge Instructions:   Discharge  Instructions     Ambulatory referral to Neurology   Complete by: As directed    An appointment is requested in approximately: 4 weeks   Call MD for:  persistant dizziness or light-headedness   Complete by: As directed    Call MD for:  severe uncontrolled pain   Complete by: As directed    Diet Carb Modified   Complete by: As directed    Discharge instructions   Complete by: As directed    Follow-up with your primary care provider in 1 week.  Check blood work at that time.  Take medications as prescribed.  Continue to take medications for diabetes.  Diabetic diet.  Monitor your blood glucose levels at home.  Discussed with your primary care provider regarding better control of diabetes.   Increase activity slowly   Complete by: As directed       Allergies as of 11/20/2023       Reactions   Morphine And Codeine     hallucinations        Medication List     STOP taking these medications    methocarbamol 500 MG tablet Commonly known as: ROBAXIN   naproxen sodium 220 MG tablet Commonly known as: ALEVE       TAKE these medications    acetaminophen  500 MG tablet Commonly known as: TYLENOL  Take 500-1,000 mg by mouth every 6 (six) hours as needed for moderate pain (pain score 4-6).   aspirin 81 MG chewable tablet Chew 1 tablet (81 mg total) by mouth daily. Start taking on: November 21, 2023   Blood Glucose Monitoring Suppl Devi 1 each by Does not apply route as directed. Dispense based on patient and insurance preference. Use up to four times daily as directed. (FOR ICD-10 E10.9, E11.9).   BLOOD GLUCOSE TEST STRIPS Strp 1 each by Does not apply route as directed. Dispense based on patient and insurance preference. Use up to four times daily as directed. (FOR ICD-10 E10.9, E11.9).   butalbital-acetaminophen -caffeine 50-325-40 MG tablet Commonly known as: FIORICET Take 1 tablet by mouth every 6 (six) hours as needed for headache.   clopidogrel 75 MG tablet Commonly  known as: PLAVIX Take 1 tablet (75 mg total) by mouth daily. Start taking on: November 21, 2023   cyclobenzaprine 5 MG tablet Commonly known as: FLEXERIL Take 1 tablet (5 mg total) by mouth 3 (three) times daily as needed for muscle spasms (neck pain).   insulin  glargine 100 UNIT/ML Solostar Pen Commonly known as: LANTUS  Inject 20 Units into the skin at bedtime.   Insulin  Pen Needle 32G X 8 MM Misc Use as directed   Lancet Device Misc 1 each by Does not apply route as directed. Dispense based on patient and insurance preference. Use up to four times daily as directed. (FOR ICD-10 E10.9, E11.9).   Lancets Misc 1 each by Does not apply route as directed. Dispense based on patient and insurance preference. Use up to four times daily as directed. (FOR ICD-10 E10.9, E11.9).   lisinopril 10 MG tablet Commonly known as: ZESTRIL Take 1 tablet (10 mg total) by mouth daily.   metFORMIN 500 MG tablet Commonly known as: GLUCOPHAGE Take 1 tablet (500 mg total) by mouth 2 (two) times daily with a meal.   Muscle Rub 10-15 % Crea Apply 1 Application topically as needed for up to 10 days for muscle pain (neck pain).   rosuvastatin 20 MG  tablet Commonly known as: CRESTOR Take 1 tablet (20 mg total) by mouth daily. What changed:  medication strength how much to take        Follow-up Information     Gerome Tillman CROME, FNP Follow up in 1 week(s).   Specialty: Family Medicine Contact information: [address] [place] [address] [phone]                  Time coordinating discharge: 39 minutes  Signed:  Alexiz Cothran  [facility] 11/20/2023, 4:12 PM

## 2023-11-20 NOTE — Plan of Care (Signed)
  Problem: Education: Goal: Ability to describe self-care measures that may prevent or decrease complications (Diabetes Survival Skills Education) will improve Outcome: Progressing   Problem: Coping: Goal: Ability to adjust to condition or change in health will improve Outcome: Progressing   Problem: Fluid Volume: Goal: Ability to maintain a balanced intake and output will improve Outcome: Progressing   Problem: Nutritional: Goal: Maintenance of adequate nutrition will improve Outcome: Progressing   Problem: Education: Goal: Knowledge of disease or condition will improve Outcome: Progressing Goal: Knowledge of secondary prevention will improve (MUST DOCUMENT ALL) Outcome: Progressing Goal: Knowledge of patient specific risk factors will improve (DELETE if not current risk factor) Outcome: Progressing

## 2023-11-20 NOTE — Evaluation (Signed)
 Occupational Therapy Evaluation Patient Details Name: Dustin Roberts MRN: [id number] DOB: 02/27/1955 Today's Date: 11/20/2023   History of Present Illness   Pt is a 68 y.o. M who presents 11/18/2023 with 2 week history of severe headache. 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 Significant PMH:  intracranial aneurysm rupture, hypertension, hyperlipidemia, diabetes and GERD.     Clinical Impressions Pt ind at baseline with ADLs/functional mobility, lives with spouse who can assist at d/c. Pt currently needs supervision for ADLs, ind with bed mobility and transfers. 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 Pt presenting with impairments listed below, however has no further acute OT needs at this time, will s/o. Please reconsult if there is a change in pt status. Anticipate no OT follow up needs at d/c.      If plan is discharge home, recommend the following:   Assistance with cooking/housework;Assist for transportation     Functional Status Assessment   Patient has had a recent decline in their functional status and demonstrates the ability to make significant improvements in function in a reasonable and predictable amount of time.     Equipment Recommendations   None recommended by OT     Recommendations for Other Services   PT consult     Precautions/Restrictions   Precautions Precautions: None Restrictions Weight Bearing Restrictions Per Provider Order: No     Mobility Bed Mobility Overal bed mobility: Independent                  Transfers Overall transfer level: Independent                        Balance Overall balance assessment: No apparent balance deficits (not formally assessed)                                          ADL either performed or assessed with clinical judgement   ADL Overall ADL's : Needs assistance/impaired Eating/Feeding: Set up   Grooming: Set up   Upper Body Bathing: Supervision/ safety   Lower Body Bathing: Supervison/ safety   Upper Body Dressing : Supervision/safety   Lower Body Dressing: Supervision/safety   Toilet Transfer: Supervision/safety   Toileting- Clothing Manipulation and Hygiene: Supervision/safety       Functional mobility during ADLs: Supervision/safety       Vision   Additional Comments: pt with RLQ hemianopsia at baseline from prior CVA, at baseline per pt     Perception Perception: Not tested       Praxis Praxis: Not tested       Pertinent Vitals/Pain Pain Assessment Pain Assessment: Faces Pain Score: 4  Faces Pain Scale: Hurts little more Pain Location: L upper trapezius; tender to touch and pain with R cervical rotation Pain Descriptors / Indicators: Discomfort, Grimacing, Guarding Pain Intervention(s): Limited activity within patient's tolerance     Extremity/Trunk Assessment Upper Extremity Assessment Upper Extremity Assessment: Generalized weakness   Lower Extremity Assessment Lower Extremity Assessment: Defer to PT evaluation   Cervical / Trunk Assessment Cervical / Trunk Assessment: Other exceptions (L upper trapezius pain)   Communication Communication Communication: Impaired Factors Affecting Communication: Hearing impaired   Cognition Arousal:  Alert Behavior During Therapy: WFL for tasks assessed/performed Cognition: No apparent impairments                               Following commands: Intact       Cueing  General Comments   Cueing Techniques: Verbal cues  VSS   Exercises     Shoulder Instructions      Home Living Family/patient expects to be discharged to:: Private residence Living Arrangements: Spouse/significant other Available Help at Discharge:  Family Type of Home: Mobile home Home Access: Stairs to enter Entrance Stairs-Number of Steps: 1   Home Layout: One level     Bathroom Shower/Tub: Tub/shower unit         Home Equipment: Cane - single point          Prior Functioning/Environment Prior Level of Function : Independent/Modified Independent;Driving                    OT Problem List:     OT Treatment/Interventions:        OT Goals(Current goals can be found in the care plan section)   Acute Rehab OT Goals Patient Stated Goal: to rest OT Goal Formulation: With patient Time For Goal Achievement: 12/04/23 Potential to Achieve Goals: Good   OT Frequency:       Co-evaluation              AM-PAC OT 6 Clicks Daily Activity     Outcome Measure Help from another person eating meals?: None Help from another person taking care of personal grooming?: None Help from another person toileting, which includes using toliet, bedpan, or urinal?: None Help from another person bathing (including washing, rinsing, drying)?: A Little Help from another person to put on and taking off regular upper body clothing?: None Help from another person to put on and taking off regular lower body clothing?: None 6 Click Score: 23   End of Session Nurse Communication: Mobility status  Activity Tolerance: Patient tolerated treatment well Patient left: in bed;with call bell/phone within reach  OT Visit Diagnosis: Unsteadiness on feet (R26.81);Other abnormalities of gait and mobility (R26.89);Muscle weakness (generalized) (M62.81)                Time: 8877-8864 OT Time Calculation (min): 13 min Charges:  OT General Charges $OT Visit: 1 Visit OT Evaluation $OT Eval Low Complexity: 1 Low  Dustin Roberts, OTD, OTR/L SecureChat Preferred Acute Rehab [phone]   Dustin Roberts 11/20/2023, 12:34 PM

## 2023-11-20 NOTE — Progress Notes (Signed)
  Echocardiogram 2D Echocardiogram has been performed.  Dustin Roberts 11/20/2023, 10:40 AM

## 2023-11-20 NOTE — Evaluation (Signed)
 Speech Language Pathology Evaluation Patient Details Name: Dustin Roberts MRN: [id number] DOB: [birth year] Today's Date: 11/20/2023 Time: 8694-8664 SLP Time Calculation (min) (ACUTE ONLY): 30 min  Problem List:  Patient Active Problem List   Diagnosis Date Noted   Headache 11/19/2023   Uncontrolled type 2 diabetes mellitus with hyperglycemia, with long-term current use of insulin  (HCC) 11/19/2023   Non-adherence to medical treatment 07/09/2021   DM type 2 causing vascular disease (HCC) 11/12/2020   Mixed hyperlipidemia 11/12/2020   Essential hypertension, benign 11/12/2020   H/O type 2 diabetes mellitus 11/06/2016   Nausea with vomiting 06/16/2016   IDA (iron deficiency anemia) 05/25/2016   GERD (gastroesophageal reflux disease) 05/25/2016   Left sided abdominal pain 05/25/2016   Diarrhea 05/25/2016   AVM (arteriovenous malformation) 08/21/2013   Past Medical History:  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   History of kidney stones    HOH (hard of hearing)    Hyperlipidemia    Hypertension    IDA (iron deficiency anemia)    Myocardial infarction (HCC)    2008   Peptic ulcer disease    Seizures (HCC)    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 knife surgery 2008   Sleep apnea    Stroke [facility])    Due to brain aneurysm   Past Surgical History:  Past Surgical History:  Procedure Laterality Date   APPENDECTOMY     BIOPSY  05/27/2016   Procedure: BIOPSY;  Surgeon: Shaaron Lamar HERO, MD;  Location: [facility];  Service: Endoscopy;;  Duodenal, esophagus   BRAIN SURGERY     aneurysm, age 50   CEREBRAL ANEURYSM REPAIR     Second procedure was gamma knife repair   COLONOSCOPY WITH PROPOFOL  N/A 05/27/2016   Procedure: COLONOSCOPY WITH PROPOFOL ;   Surgeon: Shaaron Lamar HERO, MD;  Location: [facility];  Service: Endoscopy;  Laterality: N/A;  1230    ESOPHAGOGASTRODUODENOSCOPY (EGD) WITH PROPOFOL  N/A 05/27/2016   Procedure: ESOPHAGOGASTRODUODENOSCOPY (EGD) WITH PROPOFOL ;  Surgeon: Shaaron Lamar HERO, MD;  Location[facility];  Service: Endoscopy;  Laterality: N/A;   FRACTURE SURGERY     left wrist   GIVENS CAPSULE STUDY N/A 06/08/2016   Procedure: GIVENS CAPSULE STUDY;  Surgeon: Shaaron Lamar HERO, MD;  [facility];  Service: Endoscopy;  Laterality: N/A;  7:30am. Patient to arrive at 7:00am   HERNIA REPAIR     left inguinal and umbilical   KNEE SURGERY Left    twice   POLYPECTOMY  05/27/2016   Procedure: POLYPECTOMY;  Surgeon: Shaaron Lamar HERO, MD;  [facility]: [facility];  Service: Endoscopy;;  recto-sigmoid polyp   ROTATOR CUFF REPAIR Left    x2   HPI:  Dustin Roberts is a 68 y.o. male with medical history significant of hypertension, hyperlipidemia, T2DM, GERD who presents to the emergency department due to 2-week onset of constant headache.  He went to [facility] in [place], [place] , he was treated for migraine and was discharged home.  He had a transitory relief of the headache, but this has since returned.  Headache is more on the back of the head.  He also complained of left anterior-superior chest (close up to left shoulder) pain.  He denies any fall or trauma, numbness, tingling or any extremity weakness.     ST consulted for Speech/language cognitive assessment.   Assessment / Plan / Recommendation Clinical Impression  Pt is likely at baseline functioning for memory and no further ST needs required.  ST will s/o in acute setting.    Pt seen for speech/language cognitive assessment with portions of [employer] Mental Status Examination and Cognistat paired with observations/chart review and pt report.  Pt is HOH and did not have his hearing aids available for evaluation which impacted performance  requiring repetition throughout assessment and impacted auditory comprehension tasks intermittently.  Pt's speech was noted to be intelligible within simple conversation without dysarthria present.  Pt able to follow functional directives and answer personal information questions accurately.  Ox4, but did appear min emotionally labile stating I just want to go home often during examination.  Pt denotes baseline decreased memory recall/being forgetful, but exhibited good awareness of deficits and was able to problem solve simple information without difficulty.  Pt has a hx of AVM rupture at the age of 78 and stated he had to learn to walk/talk again.  He denies dysphagia and OME unremarkable.  If symptoms persist with emotional lability and/or memory recall skills are exacerbated, he may f/u with ST at next venue of care prn.  Thank you for this consult.    SLP Assessment  SLP Recommendation/Assessment: All further Speech Language Pathology needs can be addressed in the next venue of care SLP Visit Diagnosis: Cognitive communication deficit (R41.841)     Assistance Recommended at Discharge  Other (comment) (TBD)  Functional Status Assessment Patient has had a recent decline in their functional status and demonstrates the ability to make significant improvements in function in a reasonable and predictable amount of time.  Frequency and Duration  (evaluation only)         SLP Evaluation Cognition  Overall Cognitive Status: No family/caregiver present to determine baseline cognitive functioning Arousal/Alertness: Awake/alert Orientation Level: Oriented X4 Attention: Sustained Sustained Attention: Appears intact Memory: Impaired Memory Impairment: Decreased short term memory;Decreased recall of new information;Other (comment) (baseline) Decreased Short Term Memory: Verbal complex;Functional complex Awareness: Appears intact Problem Solving: Appears intact Behaviors:  Lability Safety/Judgment: Appears intact Comments: HOH impacting auditory comprehension; has hearing aids, but not readily available       Comprehension  Auditory Comprehension Overall Auditory Comprehension: Appears within functional limits for tasks assessed Conversation: Complex Interfering Components: Pain EffectiveTechniques: Repetition;Increased volume;Other (Comment) (for HOH dx) Visual Recognition/Discrimination Discrimination: Within Function Limits Reading Comprehension Reading Status: Not tested    Expression Expression Primary Mode of Expression: Verbal Verbal Expression Overall Verbal Expression: Appears within functional limits for tasks assessed Level of Generative/Spontaneous Verbalization: Conversation Naming: No impairment Pragmatics: No impairment Non-Verbal Means of Communication: Not applicable Written Expression Dominant Hand: Right Written Expression: Not tested   Oral / Motor  Oral Motor/Sensory Function Overall Oral Motor/Sensory Function: Within functional limits Motor Speech Overall Motor Speech: Appears within functional limits for tasks assessed Respiration: Within functional limits Phonation: Normal Resonance: Within functional limits Articulation: Within functional limitis Intelligibility: Intelligible Motor Planning: Within functional limits Motor Speech Errors: Not applicable            Pat Yareth Macdonnell,M.S.,CCC-SLP 11/20/2023, 2:41 PM

## 2023-11-20 NOTE — Evaluation (Signed)
 Physical Therapy Brief Evaluation and Discharge Note Patient Details Name: Dustin Roberts MRN: [id number] DOB: 1955/07/01 Today's Date: 11/20/2023   History of Present Illness  Pt is a 68 y.o. M who presents 11/18/2023 with 2 week history of severe headache. 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 Significant PMH:  intracranial aneurysm rupture, hypertension, hyperlipidemia, diabetes and GERD.  Clinical Impression  Patient evaluated by Physical Therapy with no further acute PT needs identified. Pt denies headache and feels back to his baseline. Pt ambulating 300 ft with no assistive device and negotiated 2 steps without physical assist. Scoring 20/24 on the DGI, indicating he is not at high risk for falls. Pt does display decreased cervical ROM, with tightness in left upper trapezius muscle, tenderness to palpation and pain with right cervical rotation. Recommend follow up OPPT to address for ROM, manual therapy and postural re-education and stabilization. All education has been completed and the patient has no further questions. PT is signing off. Thank you for this referral.      PT Assessment Patient does not need any further PT services  Assistance Needed at Discharge  PRN    Equipment Recommendations None recommended by PT  Recommendations for Other Services       Precautions/Restrictions Precautions Precautions: None Restrictions Weight Bearing Restrictions Per Provider Order: No        Mobility  Bed Mobility   Supine/Sidelying to sit: Independent      Transfers Overall transfer level: Independent Equipment used: None                    Ambulation/Gait Ambulation/Gait assistance: Independent Gait Distance (Feet): 300 Feet Assistive device: None Gait  Pattern/deviations: Step-through pattern, Decreased stride length, Decreased dorsiflexion - right, Decreased dorsiflexion - left      Home Activity Instructions    Stairs Stairs: Yes Stairs assistance: Independent Stair Management: No rails Number of Stairs: 2    Modified Rankin (Stroke Patients Only) Modified Rankin (Stroke Patients Only) Pre-Morbid Rankin Score: No symptoms Modified Rankin: No symptoms      Balance Overall balance assessment: Mild deficits observed, not formally tested               Standardized Balance Assessment Standardized Balance Assessment : Dynamic Gait Index Dynamic Gait Index Level Surface: Mild Impairment Change in Gait Speed: Mild Impairment Gait with Horizontal Head Turns: Normal Gait with Vertical Head Turns: Normal Gait and Pivot Turn: Mild Impairment Step Over Obstacle: Mild Impairment Step Around Obstacles: Normal Steps: Normal Total Score: 20      Pertinent Vitals/Pain PT - Brief Vital Signs All Vital Signs Stable: Yes Pain Assessment Pain Assessment: Faces Faces Pain Scale: Hurts a little bit Pain Location: L upper trapezius; tender to touch and pain with R cervical rotation Pain Descriptors / Indicators: Discomfort, Grimacing, Guarding Pain Intervention(s): Monitored during session     Home Living Family/patient expects to be discharged to:: Private residence Living Arrangements: Spouse/significant other Available Help at Discharge: Family;Other (Comment) (small dog = Precious) Home Environment: Stairs to enter  [employer] of Steps: 1          Prior Function Level of Independence: Independent Comments: Denies falls, drives, retired from naval architect work. Enjoys being outside    UE/LE Assessment   UE ROM/Strength/Tone/Coordination: [facility]    LE ROM/Strength/Tone/Coordination: WFL  Communication   Communication Communication: Impaired Factors Affecting Communication: Hearing impaired      Cognition Overall Cognitive Status: Appears within functional limits for tasks assessed/performed       General Comments      Exercises     Assessment/Plan    PT Problem List         PT Visit Diagnosis Unsteadiness on feet (R26.81);Pain    No Skilled PT All education completed;Patient at baseline level of functioning;Patient is modified independent with all activity/mobility   Co-evaluation                AMPAC 6 Clicks Help needed turning from your back to your side while in a flat bed without using bedrails?: None Help needed moving from lying on your back to sitting on the side of a flat bed without using bedrails?: None Help needed moving to and from a bed to a chair (including a wheelchair)?: None Help needed standing up from a chair using your arms (e.g., wheelchair or bedside chair)?: None Help needed to walk in hospital room?: None Help needed climbing 3-5 steps with a railing? : None 6 Click Score: 24      End of Session   Activity Tolerance: Patient tolerated treatment well Patient left: in bed;with call bell/phone within reach Nurse Communication: Mobility status PT Visit Diagnosis: Unsteadiness on feet (R26.81);Pain Pain - Right/Left: Left Pain - part of body: Shoulder     Time: 9069-9052 PT Time Calculation (min) (ACUTE ONLY): 17 min  Charges:   PT Evaluation $PT Eval Low Complexity: 1 Low      Aleck Daring, PT, DPT Acute Rehabilitation Services Office [phone]   Aleck ONEIDA Daring  11/20/2023, 9:54 AM

## 2023-11-20 NOTE — Progress Notes (Signed)
 Discharged

## 2023-11-20 NOTE — Progress Notes (Addendum)
 STROKE TEAM PROGRESS NOTE   INTERIM HISTORY/SUBJECTIVE Headache is better but neck is hurting him. Does report chronic neck and back pain. No focal deficits on exam. Hemodynamically stable.   OBJECTIVE  CBC    Component Value Date/Time   WBC 5.8 11/20/2023 0449   RBC 4.88 11/20/2023 0449   HGB 13.7 11/20/2023 0449   HCT 39.1 11/20/2023 0449   PLT 107 (L) 11/20/2023 0449   MCV 80.1 11/20/2023 0449   MCH 28.1 11/20/2023 0449   MCHC 35.0 11/20/2023 0449   RDW 11.6 11/20/2023 0449   LYMPHSABS 1.6 11/18/2023 2050   MONOABS 0.8 11/18/2023 2050   EOSABS 0.2 11/18/2023 2050   BASOSABS 0.1 11/18/2023 2050    BMET    Component Value Date/Time   NA 140 11/20/2023 0449   NA 139 03/17/2021 1259   K 3.5 11/20/2023 0449   CL 103 11/20/2023 0449   CO2 27 11/20/2023 0449   GLUCOSE 160 (H) 11/20/2023 0449   BUN 17 11/20/2023 0449   BUN 16 03/17/2021 1259   CREATININE 0.79 11/20/2023 0449   CREATININE 0.88 07/21/2016 1042   CALCIUM 9.5 11/20/2023 0449   EGFR 90 03/17/2021 1259   GFRNONAA >60 11/20/2023 0449    IMAGING past 24 hours No results found.  Vitals:   11/19/23 1440 11/19/23 2031 11/20/23 0559 11/20/23 0752  BP: (!) 151/95 (!) 141/79 128/82 96/62  Pulse: 72 70 64 61  Resp:   17 18  Temp: 97.8 F (36.6 C) 97.7 F (36.5 C) (!) 97.5 F (36.4 C) 97.6 F (36.4 C)  TempSrc: Oral Oral Oral Oral  SpO2: 96% 100% 99% 98%  Weight:      Height:         PHYSICAL EXAM General:  Alert, well-nourished, well-developed patient in no acute distress Psych:  Mood and affect appropriate for situation CV: Regular rate and rhythm on monitor Respiratory:  Regular, unlabored respirations on room air GI: Abdomen soft and nontender   NEURO:  Mental Status: AA&Ox3, patient is able to give clear and coherent history Speech/Language: speech is without dysarthria or aphasia.  Naming, repetition, fluency, and comprehension intact.  Cranial Nerves:  II: PERRL. Visual fields full.   III, IV, VI: EOMI. Eyelids elevate symmetrically.  V: Sensation is intact to light touch and symmetrical to face.  VII: Face is symmetrical resting and smiling VIII: hearing intact to voice. IX, X: Palate elevates symmetrically. Phonation is normal.  KP:Dynloizm shrug 5/5. XII: tongue is midline without fasciculations. Motor: 5/5 strength to all muscle groups tested.  Tone: is normal and bulk is normal Sensation- Intact to light touch bilaterally. Extinction absent to light touch to DSS.   Coordination: FTN intact bilaterally, HKS: no ataxia in BLE.No drift.  Gait- deferred  Most Recent NIH 0    ASSESSMENT/PLAN  Mr. MURLE OTTING is a 68 y.o. male with history of AVM rupture prior to 2007, hypertension, hyperlipidemia, diabetes and GERD who presents with a 2-week history of headache which was gradual in onset and fluctuating in severity, sometimes reaching 10/10 intensity and sharp in nature.  NIH on Admission 1  Cervical radiculopathy Presented with back of head pain and neck pain MRI C-Spine - Multilevel cervical spondylosis with the most severe involvement at 5-6, where severe left neural foraminal narrowing is present.  At C3-4, moderate right neural foraminal narrowing. At C6-7, moderate bilateral neural foraminal narrowing.  At home uses heat pad Home meds flexeril  Conservative management   Stroke, incidental finding:  small acute and subacute infarcts in right parietal lobe and right centrum semiovale, etiology:  intracranial stenosis   CT head No acute abnormality.  CTA head & neck Moderate to severe stenoses about supraclinoid left ICA, proximal left M1 segment, right MCA bifurcation/proximal M2 segment and bilateral P2 segments, possibly reflecting changes of vasculitis or RCVS versus progressive atherosclerotic disease  MRI  Small foci of acute and subacute infarcts in superior right parietal lobe and right centrum semiovale  2D Echo EF 60-65% LDL 135 HgbA1c 12.1 UDS  + barbiturate VTE prophylaxis - SCDs No antithrombotic prior to admission, now on aspirin 81 mg daily and clopidogrel 75 mg daily for 3 months and then ASA 81mg  alone. Therapy recommendations:  outpt PT Disposition:  Pending   Intracranial stenosis 03/2022 CTA head and neck showed L M2 short segment occlusion, severe R P1, L P2+P3 Current CTA head and neck Moderate to severe stenoses about supraclinoid left ICA, proximal left M1 segment, right MCA bifurcation/proximal M2 segment and bilateral P2 segments On DAPT  Hx of AVM s/p resection Hx of AVM rupture - no records available  CT Postoperative changes from prior left craniotomy with chronic encephalomalacia within the underlying left frontotemporal region  Hypertension Home meds:  None Stable Long term BP goal normotensive  Hyperlipidemia LDL 135, goal < 70 Add Crestor 20mg  Continue statin at discharge  Diabetes type II Uncontrolled HgbA1c 12.1, goal < 7.0 On insulin  CBGs, SSI Recommend close follow-up with PCP for better DM control  Other stroke risk factors Advanced age   Other Active Problems   Hospital day # 1  Neurology will sign off. Please call with questions. Pt will follow up with stroke clinic NP at [facility] in about 4 weeks. Thanks for the consult.  Ary Cummins, MD PhD Stroke Neurology 11/20/2023 11:49 PM    To contact Stroke Continuity provider, please refer to [email or web address]. After hours, contact General Neurology

## 2023-11-20 NOTE — Progress Notes (Signed)
 Reviewed AVS, patient expressed understanding of medications, MD follow up reviewed.   Removed IV, Site clean, dry and intact.  Patient states all belongings brought to the hospital at time of admission are accounted for and packed to take home.  Patient informed and expressed understanding where to pick up discharge medications.  [facility] contacted to transport patient to entrance A, where family member was waiting in vehicle to transport home.

## 2023-12-03 ENCOUNTER — Emergency Department (HOSPITAL_COMMUNITY)

## 2023-12-03 ENCOUNTER — Inpatient Hospital Stay (HOSPITAL_COMMUNITY)
Admission: EM | Admit: 2023-12-03 | Discharge: 2023-12-05 | DRG: 066 | Disposition: A | Discharge status: Home / Self Care | Attending: Internal Medicine | Admitting: Internal Medicine

## 2023-12-03 ENCOUNTER — Encounter (HOSPITAL_COMMUNITY): Payer: Self-pay | Admitting: Emergency Medicine

## 2023-12-03 ENCOUNTER — Other Ambulatory Visit: Payer: Self-pay

## 2023-12-03 DIAGNOSIS — I6310 Cerebral infarction due to embolism of unspecified precerebral artery: Secondary | ICD-10-CM | POA: Diagnosis not present

## 2023-12-03 DIAGNOSIS — E1159 Type 2 diabetes mellitus with other circulatory complications: Secondary | ICD-10-CM | POA: Diagnosis present

## 2023-12-03 DIAGNOSIS — J449 Chronic obstructive pulmonary disease, unspecified: Secondary | ICD-10-CM | POA: Diagnosis present

## 2023-12-03 DIAGNOSIS — Z87442 Personal history of urinary calculi: Secondary | ICD-10-CM

## 2023-12-03 DIAGNOSIS — M4722 Other spondylosis with radiculopathy, cervical region: Secondary | ICD-10-CM | POA: Diagnosis present

## 2023-12-03 DIAGNOSIS — R29702 NIHSS score 2: Secondary | ICD-10-CM | POA: Diagnosis not present

## 2023-12-03 DIAGNOSIS — I2510 Atherosclerotic heart disease of native coronary artery without angina pectoris: Secondary | ICD-10-CM | POA: Diagnosis not present

## 2023-12-03 DIAGNOSIS — I110 Hypertensive heart disease with heart failure: Secondary | ICD-10-CM | POA: Diagnosis not present

## 2023-12-03 DIAGNOSIS — G4440 Drug-induced headache, not elsewhere classified, not intractable: Secondary | ICD-10-CM

## 2023-12-03 DIAGNOSIS — E782 Mixed hyperlipidemia: Secondary | ICD-10-CM | POA: Diagnosis not present

## 2023-12-03 DIAGNOSIS — Z860100 Personal history of colon polyps, unspecified: Secondary | ICD-10-CM

## 2023-12-03 DIAGNOSIS — G9389 Other specified disorders of brain: Secondary | ICD-10-CM | POA: Diagnosis present

## 2023-12-03 DIAGNOSIS — K219 Gastro-esophageal reflux disease without esophagitis: Secondary | ICD-10-CM | POA: Diagnosis present

## 2023-12-03 DIAGNOSIS — Z7984 Long term (current) use of oral hypoglycemic drugs: Secondary | ICD-10-CM

## 2023-12-03 DIAGNOSIS — I639 Cerebral infarction, unspecified: Secondary | ICD-10-CM

## 2023-12-03 DIAGNOSIS — E1165 Type 2 diabetes mellitus with hyperglycemia: Secondary | ICD-10-CM | POA: Diagnosis not present

## 2023-12-03 DIAGNOSIS — I252 Old myocardial infarction: Secondary | ICD-10-CM | POA: Diagnosis not present

## 2023-12-03 DIAGNOSIS — M5481 Occipital neuralgia: Secondary | ICD-10-CM | POA: Diagnosis present

## 2023-12-03 DIAGNOSIS — G8311 Monoplegia of lower limb affecting right dominant side: Secondary | ICD-10-CM | POA: Diagnosis present

## 2023-12-03 DIAGNOSIS — I10 Essential (primary) hypertension: Secondary | ICD-10-CM | POA: Diagnosis present

## 2023-12-03 DIAGNOSIS — Z794 Long term (current) use of insulin: Secondary | ICD-10-CM | POA: Diagnosis not present

## 2023-12-03 DIAGNOSIS — Z7982 Long term (current) use of aspirin: Secondary | ICD-10-CM

## 2023-12-03 DIAGNOSIS — G4730 Sleep apnea, unspecified: Secondary | ICD-10-CM | POA: Diagnosis present

## 2023-12-03 DIAGNOSIS — I6522 Occlusion and stenosis of left carotid artery: Secondary | ICD-10-CM | POA: Diagnosis present

## 2023-12-03 DIAGNOSIS — I69398 Other sequelae of cerebral infarction: Secondary | ICD-10-CM

## 2023-12-03 DIAGNOSIS — Z7902 Long term (current) use of antithrombotics/antiplatelets: Secondary | ICD-10-CM | POA: Diagnosis not present

## 2023-12-03 DIAGNOSIS — Z8711 Personal history of peptic ulcer disease: Secondary | ICD-10-CM

## 2023-12-03 DIAGNOSIS — H538 Other visual disturbances: Secondary | ICD-10-CM | POA: Diagnosis present

## 2023-12-03 DIAGNOSIS — I6340 Cerebral infarction due to embolism of unspecified cerebral artery: Secondary | ICD-10-CM | POA: Diagnosis not present

## 2023-12-03 DIAGNOSIS — Z885 Allergy status to narcotic agent status: Secondary | ICD-10-CM

## 2023-12-03 DIAGNOSIS — Z79899 Other long term (current) drug therapy: Secondary | ICD-10-CM

## 2023-12-03 DIAGNOSIS — H5340 Unspecified visual field defects: Secondary | ICD-10-CM

## 2023-12-03 DIAGNOSIS — R519 Headache, unspecified: Principal | ICD-10-CM | POA: Diagnosis present

## 2023-12-03 DIAGNOSIS — H9190 Unspecified hearing loss, unspecified ear: Secondary | ICD-10-CM | POA: Diagnosis present

## 2023-12-03 LAB — HEPATITIS PANEL, ACUTE
HCV Ab: NONREACTIVE
Hep A IgM: NONREACTIVE
Hep B C IgM: NONREACTIVE
Hepatitis B Surface Ag: NONREACTIVE

## 2023-12-03 LAB — HEPATIC FUNCTION PANEL
ALT: 12 U/L (ref 0–44)
AST: 13 U/L — ABNORMAL LOW (ref 15–41)
Albumin: 4.2 g/dL (ref 3.5–5.0)
Alkaline Phosphatase: 127 U/L — ABNORMAL HIGH (ref 38–126)
Bilirubin, Direct: 0.2 mg/dL (ref 0.0–0.2)
Indirect Bilirubin: 0.3 mg/dL (ref 0.3–0.9)
Total Bilirubin: 0.5 mg/dL (ref 0.0–1.2)
Total Protein: 7.5 g/dL (ref 6.5–8.1)

## 2023-12-03 LAB — PROTEIN AND GLUCOSE, CSF
Glucose, CSF: 146 mg/dL — ABNORMAL HIGH (ref 40–70)
Total  Protein, CSF: 60 mg/dL — ABNORMAL HIGH (ref 15–45)

## 2023-12-03 LAB — PROTIME-INR
INR: 1 (ref 0.8–1.2)
Prothrombin Time: 14 s (ref 11.4–15.2)

## 2023-12-03 LAB — BASIC METABOLIC PANEL WITH GFR
Anion gap: 10 (ref 5–15)
BUN: 19 mg/dL (ref 8–23)
CO2: 27 mmol/L (ref 22–32)
Calcium: 9.5 mg/dL (ref 8.9–10.3)
Chloride: 98 mmol/L (ref 98–111)
Creatinine, Ser: 0.82 mg/dL (ref 0.61–1.24)
GFR, Estimated: >60 mL/min (ref >60)
Glucose, Bld: 357 mg/dL — ABNORMAL HIGH (ref 70–99)
Potassium: 4.7 mmol/L (ref 3.5–5.1)
Sodium: 135 mmol/L (ref 135–145)

## 2023-12-03 LAB — SEDIMENTATION RATE: Sed Rate: 33 mm/h — ABNORMAL HIGH (ref 0–20)

## 2023-12-03 LAB — CBC WITH DIFFERENTIAL/PLATELET
Abs Immature Granulocytes: 0.03 K/uL (ref 0.00–0.07)
Basophils Absolute: 0 K/uL (ref 0.0–0.1)
Basophils Relative: 0 %
Eosinophils Absolute: 0.1 K/uL (ref 0.0–0.5)
Eosinophils Relative: 1 %
HCT: 38.7 % — ABNORMAL LOW (ref 39.0–52.0)
Hemoglobin: 12.8 g/dL — ABNORMAL LOW (ref 13.0–17.0)
Immature Granulocytes: 0 %
Lymphocytes Relative: 14 %
Lymphs Abs: 1.3 K/uL (ref 0.7–4.0)
MCH: 27.2 pg (ref 26.0–34.0)
MCHC: 33.1 g/dL (ref 30.0–36.0)
MCV: 82.3 fL (ref 80.0–100.0)
Monocytes Absolute: 0.9 K/uL (ref 0.1–1.0)
Monocytes Relative: 10 %
Neutro Abs: 6.7 K/uL (ref 1.7–7.7)
Neutrophils Relative %: 75 %
Platelets: 147 K/uL — ABNORMAL LOW (ref 150–400)
RBC: 4.7 MIL/uL (ref 4.22–5.81)
RDW: 12 % (ref 11.5–15.5)
WBC: 8.9 K/uL (ref 4.0–10.5)
nRBC: 0 % (ref 0.0–0.2)

## 2023-12-03 LAB — GLUCOSE, CAPILLARY: Glucose-Capillary: 298 mg/dL — ABNORMAL HIGH (ref 70–99)

## 2023-12-03 LAB — MENINGITIS/ENCEPHALITIS PANEL (CSF)

## 2023-12-03 LAB — HIV ANTIBODY (ROUTINE TESTING W REFLEX): HIV Screen 4th Generation wRfx: NONREACTIVE

## 2023-12-03 LAB — CSF CELL COUNT WITH DIFFERENTIAL
RBC Count, CSF: 12 /mm3 — ABNORMAL HIGH
RBC Count, CSF: 268 /mm3 — ABNORMAL HIGH
Tube #: 1
WBC, CSF: 1 /mm3 (ref 0–5)
WBC, CSF: 9 /mm3 — ABNORMAL HIGH (ref 0–5)

## 2023-12-03 MED ORDER — INSULIN ASPART 100 UNIT/ML IJ SOLN
0.0000 [IU] | Freq: Every day | INTRAMUSCULAR | Status: DC
  Administered 2023-12-03 – 2023-12-04 (×2): 3 [IU] via SUBCUTANEOUS
  Filled 2023-12-03 (×2): qty 3

## 2023-12-03 MED ORDER — PROCHLORPERAZINE EDISYLATE 10 MG/2ML IJ SOLN
10.0000 mg | Freq: Once | INTRAMUSCULAR | Status: AC
  Administered 2023-12-03: 10 mg via INTRAVENOUS
  Filled 2023-12-03: qty 2

## 2023-12-03 MED ORDER — DIPHENHYDRAMINE HCL 50 MG/ML IJ SOLN
12.5000 mg | Freq: Once | INTRAMUSCULAR | Status: AC
  Administered 2023-12-03: 12.5 mg via INTRAVENOUS
  Filled 2023-12-03: qty 1

## 2023-12-03 MED ORDER — INSULIN ASPART 100 UNIT/ML IJ SOLN
0.0000 [IU] | Freq: Three times a day (TID) | INTRAMUSCULAR | Status: DC
  Administered 2023-12-04 (×2): 5 [IU] via SUBCUTANEOUS
  Administered 2023-12-05: 11 [IU] via SUBCUTANEOUS
  Filled 2023-12-03 (×2): qty 5
  Filled 2023-12-03: qty 11

## 2023-12-03 MED ORDER — ACETAMINOPHEN 500 MG PO TABS
1000.0000 mg | ORAL_TABLET | Freq: Once | ORAL | Status: AC
  Administered 2023-12-03: 1000 mg via ORAL
  Filled 2023-12-03: qty 2

## 2023-12-03 MED ORDER — ASPIRIN 81 MG PO CHEW
81.0000 mg | CHEWABLE_TABLET | Freq: Every day | ORAL | Status: DC
  Administered 2023-12-04: 81 mg via ORAL
  Filled 2023-12-03: qty 1

## 2023-12-03 MED ORDER — IOHEXOL 350 MG/ML SOLN
75.0000 mL | Freq: Once | INTRAVENOUS | Status: AC | PRN
  Administered 2023-12-03: 75 mL via INTRAVENOUS

## 2023-12-03 MED ORDER — ONDANSETRON HCL 4 MG/2ML IJ SOLN: 4.0000 mg | Freq: Four times a day (QID) | INTRAMUSCULAR | Status: DC | PRN

## 2023-12-03 MED ORDER — ONDANSETRON HCL 4 MG PO TABS: 4.0000 mg | ORAL_TABLET | Freq: Four times a day (QID) | ORAL | Status: DC | PRN

## 2023-12-03 MED ORDER — CLOPIDOGREL BISULFATE 75 MG PO TABS
75.0000 mg | ORAL_TABLET | Freq: Every day | ORAL | Status: DC
  Administered 2023-12-04 – 2023-12-05 (×2): 75 mg via ORAL
  Filled 2023-12-03 (×2): qty 1

## 2023-12-03 MED ORDER — GADOBUTROL 1 MMOL/ML IV SOLN
9.0000 mL | Freq: Once | INTRAVENOUS | Status: AC | PRN
  Administered 2023-12-03: 9 mL via INTRAVENOUS

## 2023-12-03 MED ORDER — ROSUVASTATIN CALCIUM 20 MG PO TABS
20.0000 mg | ORAL_TABLET | Freq: Every day | ORAL | Status: DC
  Administered 2023-12-03 – 2023-12-05 (×3): 20 mg via ORAL
  Filled 2023-12-03 (×3): qty 1

## 2023-12-03 MED ORDER — INSULIN GLARGINE-YFGN 100 UNIT/ML ~~LOC~~ SOLN
20.0000 [IU] | Freq: Every day | SUBCUTANEOUS | Status: DC
  Administered 2023-12-03 – 2023-12-04 (×2): 20 [IU] via SUBCUTANEOUS
  Filled 2023-12-03 (×3): qty 0.2

## 2023-12-03 NOTE — ED Triage Notes (Signed)
 Patient arrives in wheelchair by POV c/o continued headache since stroke last month. Patient states Tuesday night started having bilateral leg weakness and pain. Points to right groin where pain is located. States similar symptoms to last month. Reports taking his medications as prescribed but has not had his BP medication today. Patient hard of hearing.
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
## 2023-12-03 NOTE — ED Provider Notes (Signed)
 I discussed the case with the hospitalist after neurology recommended that the patient be admitted to the hospital and transferred to [facility].  They have asked for a lumbar puncture which was performed.  Consent was obtained, see note below   Lumbar Puncture  Date/Time: 12/03/2023 5:07 PM  Performed by: Cleotilde Rogue, MD Authorized by: Cleotilde Rogue, MD   Consent:    Consent obtained:  Written and verbal   Consent given by:  Patient   Risks, benefits, and alternatives were discussed: yes     Risks discussed:  Bleeding, headache, pain, nerve damage, infection and repeat procedure   Alternatives discussed:  Alternative treatment Universal protocol:    Procedure explained and questions answered to patient or proxy's satisfaction: yes     Site/side marked: yes     Patient identity confirmed:  Verbally with patient and arm band Pre-procedure details:    Procedure purpose:  Diagnostic   Preparation: Patient was prepped and draped in usual sterile fashion   Anesthesia:    Anesthesia method:  Local infiltration   Local anesthetic:  Lidocaine  1% w/o epi Procedure details:    Lumbar space:  L3-L4 interspace   Patient position:  L lateral decubitus   Needle gauge:  22   Needle type:  Spinal needle - Quincke tip   Needle length (in):  3.5   Ultrasound guidance: no     Number of attempts:  1   Fluid appearance:  Clear   Tubes of fluid:  4   Total volume (ml):  4 Post-procedure details:    Puncture site:  Adhesive bandage applied   Procedure completion:  Tolerated well, no immediate complications Comments:           Cleotilde Rogue, MD 12/03/23 1708

## 2023-12-03 NOTE — ED Provider Notes (Signed)
 [place] EMERGENCY DEPARTMENT AT [facility] Provider Note  CSN: [id number] Arrival date & time: 12/03/23 9057  Chief Complaint(s) Headache and Weakness  HPI Dustin Roberts is a 68 y.o. male history of CHF, COPD, recent mission for stroke and intracranial stenosis, headache presenting with persistent headache.  Patient reports worsening headache so came in today.  He is also concerned that he is having right leg weakness.  Reports that he takes medicine for his headache daily.  Denies nausea or vomiting.  Denies vision changes, trouble swallowing, trouble speaking, weakness in his arms.  Reports trouble walking.  Also he reports pain in R groin.  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   History of kidney stones    HOH (hard of hearing)    Hyperlipidemia    Hypertension    IDA (iron deficiency anemia)    Myocardial infarction (HCC)    2008   Peptic ulcer disease    Seizures (HCC)    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 knife surgery 2008   Sleep apnea    Stroke [facility])    Due to brain aneurysm   Patient Active Problem List   Diagnosis Date Noted   Stroke (cerebrum) (HCC) 11/20/2023   Headache 11/19/2023   Uncontrolled type 2 diabetes mellitus with hyperglycemia, with long-term current use of insulin  (HCC) 11/19/2023   Non-adherence to medical treatment 07/09/2021   DM type 2 causing vascular disease (HCC) 11/12/2020   Mixed hyperlipidemia 11/12/2020   Essential hypertension, benign 11/12/2020   H/O type 2 diabetes mellitus 11/06/2016   Nausea with vomiting 06/16/2016   IDA (iron deficiency anemia) 05/25/2016   GERD (gastroesophageal reflux disease) 05/25/2016   Left sided abdominal pain 05/25/2016    Diarrhea 05/25/2016   AVM (arteriovenous malformation) 08/21/2013   Home Medication(s) Prior to Admission medications   Medication Sig Start Date End Date Taking? Authorizing Provider  acetaminophen  (TYLENOL ) 500 MG tablet Take 500-1,000 mg by mouth every 6 (six) hours as needed for moderate pain (pain score 4-6).    [provider]  aspirin  81 MG chewable tablet Chew 1 tablet (81 mg total) by mouth daily. 11/21/23 11/20/24  Pokhrel, Laxman, MD  butalbital -acetaminophen -caffeine  (FIORICET ) 50-325-40 MG tablet Take 1 tablet by mouth every 6 (six) hours as needed for headache. 11/20/23   Pokhrel, Laxman, MD  clopidogrel  (PLAVIX ) 75 MG tablet Take 1 tablet (75 mg total) by mouth daily. 11/21/23 02/19/24  Pokhrel, Laxman, MD  cyclobenzaprine  (FLEXERIL ) 5 MG tablet Take 1 tablet (5 mg total) by mouth 3 (three) times daily as needed for muscle spasms (neck pain). 11/20/23   Pokhrel, Laxman, MD  insulin  glargine (LANTUS ) 100 UNIT/ML Solostar Pen Inject 20 Units into the skin at bedtime. 11/20/23 11/19/24  Pokhrel, Laxman, MD  lisinopril  (ZESTRIL ) 10 MG tablet Take 1 tablet (10 mg total) by mouth daily. 11/20/23 11/19/24  Pokhrel, Laxman, MD  metFORMIN  (GLUCOPHAGE ) 500 MG tablet Take 1 tablet (500 mg total) by mouth 2 (two) times daily with a meal. 11/20/23 02/18/24  Pokhrel, Vernal, MD  rosuvastatin  (CRESTOR ) 20 MG tablet Take 1 tablet (20 mg total) by mouth daily. 11/20/23 02/18/24  Pokhrel, Laxman, MD  Past Surgical History Past Surgical History:  Procedure Laterality Date   APPENDECTOMY     BIOPSY  05/27/2016   Procedure: BIOPSY;  Surgeon: Shaaron Lamar HERO, MD;  Location: [facility];  Service: Endoscopy;;  Duodenal, esophagus   BRAIN SURGERY     aneurysm, age 35   CEREBRAL ANEURYSM REPAIR     Second procedure was gamma knife repair   COLONOSCOPY WITH PROPOFOL   N/A 05/27/2016   Procedure: COLONOSCOPY WITH PROPOFOL ;  Surgeon: Shaaron Lamar HERO, MD;  Location: [facility];  Service: Endoscopy;  Laterality: N/A;  1230    ESOPHAGOGASTRODUODENOSCOPY (EGD) WITH PROPOFOL  N/A 05/27/2016   Procedure: ESOPHAGOGASTRODUODENOSCOPY (EGD) WITH PROPOFOL ;  Surgeon: Shaaron Lamar HERO, MD;  Location: [facility];  Service: Endoscopy;  Laterality: N/A;   FRACTURE SURGERY     left wrist   GIVENS CAPSULE STUDY N/A 06/08/2016   Procedure: GIVENS CAPSULE STUDY;  Surgeon: Shaaron Lamar HERO, MD;  Location: [facility];  Service: Endoscopy;  Laterality: N/A;  7:30am. Patient to arrive at 7:00am   HERNIA REPAIR     left inguinal and umbilical   KNEE SURGERY Left    twice   POLYPECTOMY  05/27/2016   Procedure: POLYPECTOMY;  Surgeon: Shaaron Lamar HERO, MD;  Location: [facility];  Service: Endoscopy;;  recto-sigmoid polyp   ROTATOR CUFF REPAIR Left    x2   Family History Family History  Problem Relation Age of Onset   Colon cancer Neg Hx     Social History Social History   Tobacco Use   Smoking status: Never   Smokeless tobacco: Never  Vaping Use   Vaping status: Never Used  Substance Use Topics   Alcohol use: No   Drug use: No   Allergies Morphine and codeine   Review of Systems Review of Systems  All other systems reviewed and are negative.   Physical Exam Vital Signs  I have reviewed the triage vital signs BP (!) 143/88   Pulse 78   Temp 98.6 F (37 C) (Oral)   Resp 18   Ht 5' 11 (1.803 m)   Wt 88.5 kg   SpO2 94%   BMI 27.20 kg/m  Physical Exam Vitals and nursing note reviewed.  Constitutional:      General: He is not in acute distress.    Appearance: Normal appearance.  HENT:     Mouth/Throat:     Mouth: Mucous membranes are moist.  Eyes:     Conjunctiva/sclera: Conjunctivae normal.  Cardiovascular:     Rate and Rhythm: Normal rate and regular rhythm.  Pulmonary:     Effort: Pulmonary effort is normal. No respiratory distress.      Breath sounds: Normal breath sounds.  Abdominal:     General: Abdomen is flat.     Palpations: Abdomen is soft.     Tenderness: There is no abdominal tenderness.     Hernia: No hernia is present.  Musculoskeletal:     Right lower leg: No edema.     Left lower leg: No edema.  Skin:    General: Skin is warm and dry.     Capillary Refill: Capillary refill takes less than 2 seconds.  Neurological:     Mental Status: He is alert and oriented to person, place, and time. Mental status is at baseline.     Comments: Cranial nerves II through XII intact, strength 5 out of 5 in the bilateral upper extremities, no sensory deficit to light touch throughout, no dysmetria  on finger-nose-finger testing.  Strength in the bilateral lower extremities seems to be 5 out of 5 bilaterally with hip flexion, knee extension, dorsi/plantarflexion, patient has some effort dependent weakness in the right lower extremity with hip flexion which seems to be mostly related to pain given strength seems intact   Psychiatric:        Mood and Affect: Mood normal.        Behavior: Behavior normal.     ED Results and Treatments Labs (all labs ordered are listed, but only abnormal results are displayed) Labs Reviewed  BASIC METABOLIC PANEL WITH GFR - Abnormal; Notable for the following components:      Result Value   Glucose, Bld 357 (*)    All other components within normal limits  CBC WITH DIFFERENTIAL/PLATELET - Abnormal; Notable for the following components:   Hemoglobin 12.8 (*)    HCT 38.7 (*)    Platelets 147 (*)    All other components within normal limits  SEDIMENTATION RATE - Abnormal; Notable for the following components:   Sed Rate 33 (*)    All other components within normal limits  PROTIME-INR  C-REACTIVE PROTEIN  ANA W/REFLEX IF POSITIVE  ANTIEXTRACTABLE NUCLEAR AG  ANCA TITERS  RHEUMATOID FACTOR  HEPATIC FUNCTION PANEL  C3 COMPLEMENT  C4 COMPLEMENT  CRYOGLOBULIN  PROTEIN ELECTROPHORESIS,  SERUM  LYME DISEASE SEROLOGY W/REFLEX  HSV DNA BY PCR (REFERENCE LAB)  HEPATITIS PANEL, ACUTE  SYPHILIS: RPR W/REFLEX TO RPR TITER AND TREPONEMAL ANTIBODIES, TRADITIONAL SCREENING AND DIAGNOSIS ALGORITHM  BARTONELLA ANTIBODY PANEL  QUANTIFERON-TB GOLD PLUS  HIV ANTIBODY (ROUTINE TESTING W REFLEX)                                                                                                                          Radiology MR BRAIN WO CONTRAST Result Date: 12/03/2023 EXAM: MRI BRAIN WITHOUT CONTRAST 12/03/2023 12:42:12 PM TECHNIQUE: Multiplanar multisequence MRI of the head/brain was performed without the administration of intravenous contrast. COMPARISON: MRI head 11/19/2023 and earlier same day CTA head and neck. CLINICAL HISTORY: Neuro deficit, acute, stroke suspected. FINDINGS: BRAIN AND VENTRICLES: There are multiple small foci of diffusion signal abnormality in the right centrum semiovale and right parietal lobe corresponding to infarcts seen on the prior MRI; these infarcts are now subacute in appearance. There are a few additional punctate areas of infarct within the right parietal lobe which appear late acute versus subacute and were not seen on the prior MRI. These infarcts are noted on series 5 images 34 and 35. Encephalomalacia and gliosis in the left MCA territory primarily involving the left temporal lobe compatible with remote infarct. There are additional remote lacunar infarcts in the left basal ganglia and a more prominent remote lacunar infarct in the right lentiform nucleus. Scattered white matter signal abnormalities suggestive of mild chronic microvascular ischemic changes. There is mild age related volume loss. No intracranial hemorrhage. No mass. No midline shift. No hydrocephalus. The sella is unremarkable. Normal flow  voids. ORBITS: No acute abnormality. SINUSES AND MASTOIDS: Mild mucosal thickening in the right ethmoid sinus. Trace fluid in the mastoid air cells. BONES AND  SOFT TISSUES: Normal marrow signal. No acute soft tissue abnormality. IMPRESSION: 1. Multiple small foci of diffusion signal abnormality in the right centrum semiovale and right parietal lobe corresponding to prior infarcts, now subacute in appearance. 2. A few additional punctate infarcts within the right parietal lobe, late acute versus subacute, not seen on the prior MRI. 3. Encephalomalacia and gliosis in the left MCA territory primarily involving the left temporal lobe, compatible with remote infarct. 4. Additional remote lacunar infarcts in the left basal ganglia and a more prominent remote lacunar infarct in the right lentiform nucleus. Electronically signed by: Donnice Mania MD 12/03/2023 01:19 PM EST RP Workstation: [id number]   CT ANGIO HEAD NECK W WO CM Result Date: 12/03/2023 EXAM: CTA HEAD AND NECK WITHOUT AND WITH 12/03/2023 12:03:53 PM TECHNIQUE: CTA of the head and neck was performed without and with the administration of 75 mL of iohexol  (OMNIPAQUE ) 350 MG/ML injection. Multiplanar 2D and/or 3D reformatted images are provided for review. Automated exposure control, iterative reconstruction, and/or weight based adjustment of the mA/kV was utilized to reduce the radiation dose to as low as reasonably achievable. Stenosis of the internal carotid arteries measured using NASCET criteria. COMPARISON: MRI head 11/19/2023 CLINICAL HISTORY: Neuro deficit, acute, stroke suspected. FINDINGS: CTA NECK: AORTIC ARCH AND ARCH VESSELS: Mild atherosclerosis of the visualized aortic arch. The left vertebral artery originates on the aortic arch. No dissection or arterial injury. No significant stenosis of the brachiocephalic or subclavian arteries. CERVICAL CAROTID ARTERIES: The right carotid artery is patent from the origin to the skull base. Mild atherosclerosis at the right carotid bifurcation without hemodynamically significant stenosis. The left carotid artery is patent from the origin to the skull base. No  hemodynamically significant stenosis. No dissection or arterial injury. CERVICAL VERTEBRAL ARTERIES: The right vertebral artery is dominant. The left vertebral artery originates on the aortic arch. Vertebral arteries are patent from the origins to the vertebrobasilar confluence. No dissection, arterial injury, or significant stenosis. LUNGS AND MEDIASTINUM: Unremarkable. SOFT TISSUES: No acute abnormality. BONES: Degenerative changes in the visualized spine. Edentulous maxilla and mandible. Left frontotemporal craniotomy. Mild mucosal thickening in the ethmoid sinuses, right greater than left. CTA HEAD: ANTERIOR CIRCULATION: Intracranial internal carotid arteries are patent bilaterally. Atherosclerosis of the carotid siphons. There is focal moderate-to-severe narrowing at the left supraclinoid right ICA without significant calcified atherosclerosis at this level. Diminutive A1 segment of the left ACA which is likely congenital. There is irregularity and mild narrowing of the right A1 segment. The anterior cerebral arteries are patent bilaterally. There is focal severe stenosis of the proximal M1 segment of the left MCA. Additional irregularity of the distal left M1 segment. Diminutive caliber of multiple M2 and M3 branches of the left MCA likely related to prior infarct. There is focal severe stenosis of an M3 branch of the left MCA within the posterior aspect of the sylvian fissure. There is mild narrowing and distal tapering of the right M1 segment. There is moderate posterior stenosis of a proximal infarct branch of the right MCA. No aneurysm. POSTERIOR CIRCULATION: There is short segment moderate to severe stenosis of the anterior P2 segment of the right PCA. The posterior P2 segment of the right PCA is diminutive in caliber with irregularity and in multiple areas of moderate stenosis. Irregularity and moderate stenosis of the P2 segment of the left PCA.  No significant stenosis of the basilar artery. No  significant stenosis of the vertebral arteries. No aneurysm. OTHER: Redemonstrated encephalomalacia in the left MCA territory primarily involving the left temporal lobe. Additional remote infarcts in the bilateral basal ganglia. There is mild ex vacuo dilatation of the left temporal horn. No midline shift. The basilar cisterns are patent. No dural venous sinus thrombosis on this non-dedicated study. IMPRESSION: 1. No acute large vessel occlusion. 2. Multifocal intracranial arterial stenoses, similar to prior. 3. Similar moderate-to-severe stenosis of the supraclinoid left ICA. 4. Focal severe stenosis of the proximal M1 segment of the left MCA with additional distal M1 irregularity and diminutive caliber of multiple M2 and M3 branches of the left MCA, likely related to prior infarct. 5. Focal severe stenosis of an M3 branch of the left MCA within the posterior aspect of the sylvian fissure. 6. Mild narrowing and distal tapering of the right M1 segment with moderate-to-severe stenosis of a proximal M3 branch. 7. Short segment moderate to severe stenosis of the anterior P2 segment of the right PCA, with diminutive caliber and irregularity of the posterior P2 segment. Irregularity with moderate stenosis of the left PCA P2 segment. Electronically signed by: Donnice Mania MD 12/03/2023 12:24 PM EST RP Workstation: [id number]    Pertinent labs & imaging results that were available during my care of the patient were reviewed by me and considered in my medical decision making (see MDM for details).  Medications Ordered in ED Medications  acetaminophen  (TYLENOL ) tablet 1,000 mg (1,000 mg Oral Given 12/03/23 1040)  prochlorperazine  (COMPAZINE ) injection 10 mg (10 mg Intravenous Given 12/03/23 1040)  diphenhydrAMINE  (BENADRYL ) injection 12.5 mg (12.5 mg Intravenous Given 12/03/23 1041)  iohexol  (OMNIPAQUE ) 350 MG/ML injection 75 mL (75 mLs Intravenous Contrast Given 12/03/23 1142)  gadobutrol  (GADAVIST ) 1 MMOL/ML  injection 9 mL (9 mLs Intravenous Contrast Given 12/03/23 1439)                                                                                                                                     Procedures Procedures  (including critical care time)  Medical Decision Making / ED Course   MDM:  50-year-old presenting to the emergency department with leg weakness, persistent headaches.  Patient was just admitted for headache, found to have stroke.  There was some thought he may have had RCVS however it seems this was ultimately not thought to be the cause of his symptoms.  Patient was discharged but reports persistent headaches.  He also reports right leg weakness although this seems probably more related to musculoskeletal groin pain.  No palpable hernia.  Will check MRI brain, repeat CT angio head to evaluate for any interval changes.  Will give migraine cocktail.  Will reassess.  Clinical Course as of 12/03/23 1549  Fri Dec 03, 2023  1547 Signed out to Dr. Cleotilde. Neurology recommend obtaining further labs and admission [WS]    Clinical  Course User Index [WS] Francesca Elsie CROME, MD     Additional history obtained: -Additional history obtained from spouse -External records from outside source obtained and reviewed including: Chart review including previous notes, labs, imaging, consultation notes including prior notes    Lab Tests: -I ordered, reviewed, and interpreted labs.   The pertinent results include:   Labs Reviewed  BASIC METABOLIC PANEL WITH GFR - Abnormal; Notable for the following components:      Result Value   Glucose, Bld 357 (*)    All other components within normal limits  CBC WITH DIFFERENTIAL/PLATELET - Abnormal; Notable for the following components:   Hemoglobin 12.8 (*)    HCT 38.7 (*)    Platelets 147 (*)    All other components within normal limits  SEDIMENTATION RATE - Abnormal; Notable for the following components:   Sed Rate 33 (*)    All other  components within normal limits  PROTIME-INR  C-REACTIVE PROTEIN  ANA W/REFLEX IF POSITIVE  ANTIEXTRACTABLE NUCLEAR AG  ANCA TITERS  RHEUMATOID FACTOR  HEPATIC FUNCTION PANEL  C3 COMPLEMENT  C4 COMPLEMENT  CRYOGLOBULIN  PROTEIN ELECTROPHORESIS, SERUM  LYME DISEASE SEROLOGY W/REFLEX  HSV DNA BY PCR (REFERENCE LAB)  HEPATITIS PANEL, ACUTE  SYPHILIS: RPR W/REFLEX TO RPR TITER AND TREPONEMAL ANTIBODIES, TRADITIONAL SCREENING AND DIAGNOSIS ALGORITHM  BARTONELLA ANTIBODY PANEL  QUANTIFERON-TB GOLD PLUS  HIV ANTIBODY (ROUTINE TESTING W REFLEX)    Notable for mild hypergylcemia      Imaging Studies ordered: I ordered imaging studies including CTA head, MRI brain On my interpretation imaging demonstrates punctate acute stroke  I independently visualized and interpreted imaging. I agree with the radiologist interpretation   Medicines ordered and prescription drug management: Meds ordered this encounter  Medications   acetaminophen  (TYLENOL ) tablet 1,000 mg   prochlorperazine  (COMPAZINE ) injection 10 mg   diphenhydrAMINE  (BENADRYL ) injection 12.5 mg   iohexol  (OMNIPAQUE ) 350 MG/ML injection 75 mL   gadobutrol  (GADAVIST ) 1 MMOL/ML injection 9 mL    -I have reviewed the patients home medicines and have made adjustments as needed  Reevaluation: After the interventions noted above, I reevaluated the patient and found that their symptoms have improved  Co morbidities that complicate the patient evaluation  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    Final Clinical Impression(s) / ED Diagnoses Final diagnoses:  Nonintractable headache, unspecified chronicity pattern, unspecified headache type  Acute ischemic stroke [facility])     This chart was dictated using voice recognition software.  Despite best efforts to proofread,  errors can occur which can change the documentation meaning.    Francesca Elsie CROME, MD 12/03/23 [id number]

## 2023-12-03 NOTE — Plan of Care (Signed)

## 2023-12-03 NOTE — ED Notes (Signed)
[facility] called for transport to [facility].  

## 2023-12-03 NOTE — Consult Note (Signed)
 I connected with  [name] on 12/03/23 by a video enabled telemedicine application and verified that I am speaking with the correct person using two identifiers.   I discussed the limitations of evaluation and management by telemedicine. The patient expressed understanding and agreed to proceed.  NEUROLOGY CONSULT NOTE   Date of service: December 03, 2023 Patient Name: Dustin Roberts MRN:  [id number] DOB:  [birth year] Chief Complaint: Headache Requesting Provider: Francesca Elsie CROME, MD  History of Present Illness  Dustin Roberts is a 68 y.o. right-handed male with hx of ruptured AVM (s/p L craniotomy 1997, gamma knife 2007 for residual over the L superior temporal gyrus with feeder from L MCA, c/b chronic HA), multiple prior strokes (residual R field cut), hearing loss, shingles (c/b R herpes zoster ophthalmicus, resolved as per 2019 ophthalmology exam), COPD, CHF, HTN, DM, who presents with persistent recurrent headache, found to have new strokes on imaging compared to two weeks prior.  Patient has had a headache since after his gamma knife procedure in 2007 that was done for residual AVM in the L superior temporal gyrus. Headache is localized to the left side of his head, described as sharp. The quality of pain is the same, however, in the past few months, the frequency & severity has increased.  For this reason, he was seen in [place], [place] in end of October/early November. He was treated and headache did improve on Fioricet , however worsened again.  He then presented to [facility] and as admitted to [facility] (11/13 - 11/21/23) after CTA obtained showing multifocal intracranial stenosis, MRI w/wo with acute/subacute strokes in the R parietal and R centrum semiovale. Compared to his 03/2022 CTA (also obtained for L sided HA after a fall with headstrike), there was new/progression of stenosis of the L ICA (supraclinoid), L M1, R M1/M2, bilateral P2. CTA in 2024 had showed R P1, proximal P2 & P3 stenosis,  and L M2 occlusion; as well as clustered vessels near L MCA (possibly the reported L superior temporal AVM).   Initially had considered RCVS on differential, however description of headache was with gradual onset and chronic, therefore unlikely.  UDS was negative except for barbiturates (iso Fioricet )  He was discharged with DAPT x 90 days as per [facility].   He presents today with the same persistent headache without new focal deficits. There was question of leg weakness, however this was found to be effort-dependent and limited due to R hip groin pain.   Patient reports he has been taking medication for his headache at least 5 days out of 7 for months prior to presenting in early November.   LKW: Tuesday night, 11/30/23 (onset of BLE pain and weakness) Modified rankin score: 0-Completely asymptomatic and back to baseline post- stroke IV Thrombolysis: No, out of window EVT: No, out of window ICH Score: N/A  NIHSS components Score: Comment  1a Level of Conscious 0[]  1[]  2[]  3[]      1b LOC Questions 0[]  1[]  2[]       1c LOC Commands 0[]  1[]  2[]       2 Best Gaze 0[]  1[]  2[]       3 Visual 0[]  1[]  2[x]  3[]    Right  4 Facial Palsy 0[]  1[]  2[]  3[]      5a Motor Arm - left 0[]  1[]  2[]  3[]  4[]  UN[]    5b Motor Arm - Right 0[]  1[]  2[]  3[]  4[]  UN[]    6a Motor Leg - Left 0[]  1[]  2[]  3[]  4[]  UN[]   6b Motor Leg - Right 0[]  1[]  2[]  3[]  4[]  UN[]    7 Limb Ataxia 0[]  1[]  2[]  UN[]      8 Sensory 0[]  1[]  2[]  UN[]      9 Best Language 0[]  1[]  2[]  3[]      10 Dysarthria 0[]  1[]  2[]  UN[]      11 Extinct. and Inattention 0[]  1[]  2[]       TOTAL: 2 (chronic)      ROS  Comprehensive ROS performed and pertinent positives documented in HPI    Past History   Past Medical History:  Diagnosis Date   Arthritis    CHF (congestive heart failure) (HCC)    COPD (chronic obstructive pulmonary disease) (HCC)    Diabetes mellitus    GERD (gastroesophageal reflux disease)    Headache    has headaches daily since  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   History of kidney stones    HOH (hard of hearing)    Hyperlipidemia    Hypertension    IDA (iron deficiency anemia)    Myocardial infarction (HCC)    2008   Peptic ulcer disease    Seizures (HCC)    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 knife surgery 2008   Sleep apnea    Stroke [facility])    Due to brain aneurysm    Past Surgical History:  Procedure Laterality Date   APPENDECTOMY     BIOPSY  05/27/2016   Procedure: BIOPSY;  Surgeon: Shaaron Lamar HERO, MD;  [facility];  Service: Endoscopy;;  Duodenal, esophagus   BRAIN SURGERY     aneurysm, age 78   CEREBRAL ANEURYSM REPAIR     Second procedure was gamma knife repair   COLONOSCOPY WITH PROPOFOL  N/A 05/27/2016   Procedure: COLONOSCOPY WITH PROPOFOL ;  Surgeon: Shaaron Lamar HERO, MD;  [facility];  Service: Endoscopy;  Laterality: N/A;  1230    ESOPHAGOGASTRODUODENOSCOPY (EGD) WITH PROPOFOL  N/A 05/27/2016   Procedure: ESOPHAGOGASTRODUODENOSCOPY (EGD) WITH PROPOFOL ;  Surgeon: Shaaron Lamar HERO, MD;  [facility];  Service: Endoscopy;  Laterality: N/A;   FRACTURE SURGERY     left wrist   GIVENS CAPSULE STUDY N/A 06/08/2016   Procedure: GIVENS CAPSULE STUDY;  Surgeon: Shaaron Lamar HERO, MD;  [facility];  Service: Endoscopy;  Laterality: N/A;  7:30am. Patient to arrive at 7:00am   HERNIA REPAIR     left inguinal and umbilical   KNEE SURGERY Left    twice   POLYPECTOMY  05/27/2016   Procedure: POLYPECTOMY;  Surgeon: Shaaron Lamar HERO, MD;  [facility];  Service: Endoscopy;;  recto-sigmoid polyp   ROTATOR CUFF REPAIR Left    x2    Family History: Family History  Problem Relation Age of Onset   Colon cancer Neg Hx     Social History  reports that he has never smoked. He has never used smokeless tobacco. He reports that he does not drink alcohol and does not use drugs.  Allergies  Allergen Reactions   Morphine And Codeine      hallucinations     Medications  No current facility-administered medications for this encounter.  Current Outpatient Medications:    acetaminophen  (TYLENOL ) 500 MG tablet, Take 500-1,000 mg by mouth every 6 (six) hours as needed for moderate pain (pain score 4-6)., Disp: , Rfl:    aspirin  81 MG chewable tablet, Chew 1 tablet (81 mg total) by mouth daily., Disp: 90 tablet, Rfl: 3   butalbital -acetaminophen -caffeine  (FIORICET ) 50-325-40 MG tablet, Take  1 tablet by mouth every 6 (six) hours as needed for headache., Disp: 14 tablet, Rfl: 0   clopidogrel  (PLAVIX ) 75 MG tablet, Take 1 tablet (75 mg total) by mouth daily., Disp: 90 tablet, Rfl: 0   cyclobenzaprine  (FLEXERIL ) 5 MG tablet, Take 1 tablet (5 mg total) by mouth 3 (three) times daily as needed for muscle spasms (neck pain)., Disp: 30 tablet, Rfl: 0   insulin  glargine (LANTUS ) 100 UNIT/ML Solostar Pen, Inject 20 Units into the skin at bedtime., Disp: 18 mL, Rfl: 0   lisinopril  (ZESTRIL ) 10 MG tablet, Take 1 tablet (10 mg total) by mouth daily., Disp: 30 tablet, Rfl: 2   metFORMIN  (GLUCOPHAGE ) 500 MG tablet, Take 1 tablet (500 mg total) by mouth 2 (two) times daily with a meal., Disp: 60 tablet, Rfl: 2   rosuvastatin  (CRESTOR ) 20 MG tablet, Take 1 tablet (20 mg total) by mouth daily., Disp: 30 tablet, Rfl: 2  Vitals   Vitals:   December 10, 2023 1238 December 10, 2023 1330 December 10, 2023 1345 2023-12-10 1358  BP: 138/74 (!) 140/78 (!) 143/88   Pulse:  75 78   Resp:   18 18  Temp:      TempSrc:      SpO2:  96% 94%   Weight:      Height:        Body mass index is 27.2 kg/m.   Physical Exam  Performed with assistance of RN at bedside  Constitutional: Appears well-developed and well-nourished.  Psych: Affect appropriate to situation.  Eyes: No scleral injection.  HENT: No OP obstruction.  Head: Normocephalic. Exquisitely tender to palpation at distribution of distal L occipital nerve, near vertex of head. Cardiovascular: Appears well perfused Respiratory: Effort  normal, non-labored breathing.  GI: No distension Skin: WDI.   Neurologic Examination  Cranial nerves: PERRL (however R is sluggish) EOMI without nystagmus or diplopia R visual field cut (assessed by blink to threat) Face symmetric at rest and with activation Face sensation intact V1-V3 symmetrically. Very, very hard of hearing.  Strength: R-handed No abnormal movements such as tremor, myoclonus. No pronator drift or drift of LE. 5/5 deltoids, biceps, triceps, hand grip 5/5 hip flexion (pain with testing of the RLE)  Sensory: Sensation intact to light touch in UE and LE.  Reflexes: Deferred as tele  Coordination: No ataxia with FTN with eyes closed.  Labs/Imaging/Neurodiagnostic studies   CBC:  Recent Labs  Lab 10-Dec-2023 1027  WBC 8.9  NEUTROABS 6.7  HGB 12.8*  HCT 38.7*  MCV 82.3  PLT 147*   Basic Metabolic Panel:  Lab Results  Component Value Date   NA 135 2023/12/10   K 4.7 December 10, 2023   CO2 27 December 10, 2023   GLUCOSE 357 (H) Dec 10, 2023   BUN 19 2023/12/10   CREATININE 0.82 Dec 10, 2023   CALCIUM  9.5 12-10-23   GFRNONAA >60 2023/12/10   GFRAA >60 05/26/2016   Lipid Panel:  Lab Results  Component Value Date   LDLCALC 135 (H) 11/20/2023   HgbA1c:  Lab Results  Component Value Date   HGBA1C 12.1 (H) 11/18/2023   Urine Drug Screen:     Component Value Date/Time   LABOPIA NONE DETECTED 11/20/2023 0816   COCAINSCRNUR NONE DETECTED 11/20/2023 0816   LABBENZ NONE DETECTED 11/20/2023 0816   AMPHETMU NONE DETECTED 11/20/2023 0816   THCU NONE DETECTED 11/20/2023 0816   LABBARB POSITIVE (A) 11/20/2023 0816    Alcohol Level No results found for: [facility] INR  Lab Results  Component Value Date   INR 1.0  12/03/2023     CT angio Head and Neck with contrast(12/03/23, Personally reviewed): IMPRESSION: 1. No acute large vessel occlusion. 2. Multifocal intracranial arterial stenoses, similar to prior. 3. Similar moderate-to-severe stenosis of the  supraclinoid left ICA. 4. Focal severe stenosis of the proximal M1 segment of the left MCA with additional distal M1 irregularity and diminutive caliber of multiple M2 and M3 branches of the left MCA, likely related to prior infarct. 5. Focal severe stenosis of an M3 branch of the left MCA within the posterior aspect of the sylvian fissure. 6. Mild narrowing and distal tapering of the right M1 segment with moderate-to-severe stenosis of a proximal M3 branch. 7. Short segment moderate to severe stenosis of the anterior P2 segment of the right PCA, with diminutive caliber and irregularity of the posterior P2 segment. Irregularity with moderate stenosis of the left PCA P2 segment.  CT angio Head and Neck with contrast(11/18/23, Personally reviewed): 1. Negative CTA for acute large vessel occlusion. 2. New and/or progressive moderate to severe stenoses about the supraclinoid left ICA, proximal left M1 segment, right MCA bifurcation/proximal M2 segment, and bilateral P2 segments as above. While these findings could reflect progressive atherosclerotic disease, possible changes of vasculitis and/or RCVS could also be considered given the history of headache. 3. Mild atheromatous change about the carotid bifurcations. No hemodynamically significant stenosis within the neck.  MRI Brain w/wo (12/03/23, Personally reviewed): New punctuate infarcts not seen on MRI brain 12/03/23: R parietal Evolving subacute infarcts in R parietal and R centrum semiovale. Encephalomalacia & gliosis of L temp, bilateral BG  MRI Brain w/wo (11/19/23, Personally reviewed): Acute/subacute infarcts in R parietal and R centrum semiovale. Encephalomalacia of L temp, bilateral BG  CTV (11/18/2023, as per report): No thrombosis.  MRI cervical spine w/o (11/19/23, Personally reviewed): Multi-level mild canal stenosis however mass effect on cord, no signal cord change. Severe L C5-C6 neural foraminal narrowing.   ASSESSMENT    MYKELL RAWL is a 68 y.o. male with hx of ruptured AVM (s/p L craniotomy 1997, gamma knife 2007 for residual over the L superior temporal gyrus with feeder from L MCA, c/b chronic HA), multiple prior strokes (residual R field cut), hearing loss, shingles (c/b R herpes zoster ophthalmicus, resolved as per 2019 ophthalmology exam), COPD, CHF, HTN, DM, who presents with persistent recurrent headache, found to have new strokes on imaging compared to two weeks prior.   Presentation could be secondary to intracranial stenosis from atherosclerotic disease, however recurrence in such a short amount of time as well as in association with headache warrants further investigation.   Considered vasculopathy sequelae of gamma knife, however stenosis and strokes are on the right side, opposite to target.   Headache itself could be separate issue, as he reports having similar headache for many years after the gamma knife surgery, but it is unclear why he has had an increase in severity and frequency. There is also likely a component of medication-overuse headache as well as he has increased his OTC intake due to increase in his headaches.   RECOMMENDATIONS  - Treat headache with migraine cocktail for now  - Hold off on steroids for now - Consider occipital nerve block if possible - Vasculitis work-up  - Serum studies for rheumatologic, infectious have been ordered  - LP studies have been ordered - After ruling out infection, can consider steroids which would treat both vasculitis as well as medication-overuse headache  ______________________________________________________________________    Signed, Normie CHRISTELLA Blower, MD [facility]

## 2023-12-04 ENCOUNTER — Inpatient Hospital Stay (HOSPITAL_COMMUNITY)

## 2023-12-04 DIAGNOSIS — I6310 Cerebral infarction due to embolism of unspecified precerebral artery: Secondary | ICD-10-CM | POA: Diagnosis not present

## 2023-12-04 DIAGNOSIS — E1159 Type 2 diabetes mellitus with other circulatory complications: Secondary | ICD-10-CM | POA: Diagnosis not present

## 2023-12-04 DIAGNOSIS — Z7902 Long term (current) use of antithrombotics/antiplatelets: Secondary | ICD-10-CM | POA: Diagnosis not present

## 2023-12-04 DIAGNOSIS — J449 Chronic obstructive pulmonary disease, unspecified: Secondary | ICD-10-CM | POA: Diagnosis not present

## 2023-12-04 DIAGNOSIS — E1165 Type 2 diabetes mellitus with hyperglycemia: Secondary | ICD-10-CM | POA: Diagnosis not present

## 2023-12-04 DIAGNOSIS — I639 Cerebral infarction, unspecified: Secondary | ICD-10-CM | POA: Diagnosis not present

## 2023-12-04 DIAGNOSIS — I6522 Occlusion and stenosis of left carotid artery: Secondary | ICD-10-CM | POA: Diagnosis not present

## 2023-12-04 DIAGNOSIS — Z794 Long term (current) use of insulin: Secondary | ICD-10-CM | POA: Diagnosis not present

## 2023-12-04 DIAGNOSIS — I2510 Atherosclerotic heart disease of native coronary artery without angina pectoris: Secondary | ICD-10-CM | POA: Diagnosis not present

## 2023-12-04 DIAGNOSIS — E782 Mixed hyperlipidemia: Secondary | ICD-10-CM | POA: Diagnosis not present

## 2023-12-04 DIAGNOSIS — G8311 Monoplegia of lower limb affecting right dominant side: Secondary | ICD-10-CM | POA: Diagnosis not present

## 2023-12-04 DIAGNOSIS — G9389 Other specified disorders of brain: Secondary | ICD-10-CM | POA: Diagnosis not present

## 2023-12-04 DIAGNOSIS — I110 Hypertensive heart disease with heart failure: Secondary | ICD-10-CM | POA: Diagnosis not present

## 2023-12-04 LAB — GLUCOSE, CAPILLARY
Glucose-Capillary: 231 mg/dL — ABNORMAL HIGH (ref 70–99)
Glucose-Capillary: 245 mg/dL — ABNORMAL HIGH (ref 70–99)
Glucose-Capillary: 289 mg/dL — ABNORMAL HIGH (ref 70–99)

## 2023-12-04 LAB — BASIC METABOLIC PANEL WITH GFR
Anion gap: 8 (ref 5–15)
BUN: 12 mg/dL (ref 8–23)
CO2: 26 mmol/L (ref 22–32)
Calcium: 8.9 mg/dL (ref 8.9–10.3)
Chloride: 102 mmol/L (ref 98–111)
Creatinine, Ser: 0.67 mg/dL (ref 0.61–1.24)
GFR, Estimated: >60 mL/min (ref >60)
Glucose, Bld: 240 mg/dL — ABNORMAL HIGH (ref 70–99)
Potassium: 3.7 mmol/L (ref 3.5–5.1)
Sodium: 136 mmol/L (ref 135–145)

## 2023-12-04 LAB — C4 COMPLEMENT: Complement C4, Body Fluid: 21 mg/dL (ref 12–38)

## 2023-12-04 LAB — CBC
HCT: 34.1 % — ABNORMAL LOW (ref 39.0–52.0)
Hemoglobin: 11.6 g/dL — ABNORMAL LOW (ref 13.0–17.0)
MCH: 27.3 pg (ref 26.0–34.0)
MCHC: 34 g/dL (ref 30.0–36.0)
MCV: 80.2 fL (ref 80.0–100.0)
Platelets: 126 K/uL — ABNORMAL LOW (ref 150–400)
RBC: 4.25 MIL/uL (ref 4.22–5.81)
RDW: 12.1 % (ref 11.5–15.5)
WBC: 6.8 K/uL (ref 4.0–10.5)
nRBC: 0 % (ref 0.0–0.2)

## 2023-12-04 LAB — ENA+DNA/DS+ANTICH+CENTRO+JO...
Anti JO-1: <0.2 AI (ref 0.0–0.9)
Centromere Ab Screen: <0.2 AI (ref 0.0–0.9)
Chromatin Ab SerPl-aCnc: <0.2 AI (ref 0.0–0.9)
ENA SM Ab Ser-aCnc: <0.2 AI (ref 0.0–0.9)
Ribonucleic Protein: 2.3 AI — ABNORMAL HIGH (ref 0.0–0.9)
SSA (Ro) (ENA) Antibody, IgG: 1.1 AI — ABNORMAL HIGH (ref 0.0–0.9)
SSB (La) (ENA) Antibody, IgG: <0.2 AI (ref 0.0–0.9)
Scleroderma (Scl-70) (ENA) Antibody, IgG: <0.2 AI (ref 0.0–0.9)
ds DNA Ab: 2 [IU]/mL (ref 0–9)

## 2023-12-04 LAB — ANTIEXTRACTABLE NUCLEAR AG
ENA SM Ab Ser-aCnc: <0.2 AI (ref 0.0–0.9)
Ribonucleic Protein: 2.4 AI — ABNORMAL HIGH (ref 0.0–0.9)

## 2023-12-04 LAB — SYPHILIS: RPR W/REFLEX TO RPR TITER AND TREPONEMAL ANTIBODIES, TRADITIONAL SCREENING AND DIAGNOSIS ALGORITHM: RPR Ser Ql: NONREACTIVE

## 2023-12-04 LAB — C-REACTIVE PROTEIN: CRP: 6.1 mg/dL — ABNORMAL HIGH (ref <1.0)

## 2023-12-04 LAB — C3 COMPLEMENT: C3 Complement: 170 mg/dL — ABNORMAL HIGH (ref 82–167)

## 2023-12-04 LAB — ANA W/REFLEX IF POSITIVE: Anti Nuclear Antibody (ANA): POSITIVE — AB

## 2023-12-04 LAB — RHEUMATOID FACTOR: Rheumatoid fact SerPl-aCnc: 12.6 [IU]/mL (ref <14.0)

## 2023-12-04 MED ORDER — DEXAMETHASONE SOD PHOSPHATE PF 10 MG/ML IJ SOLN
20.0000 mg | Freq: Once | INTRAMUSCULAR | Status: AC
  Administered 2023-12-04: 20 mg via INTRAMUSCULAR

## 2023-12-04 MED ORDER — LIDOCAINE HCL 1 % IJ SOLN
10.0000 mL | Freq: Once | INTRAMUSCULAR | Status: AC
  Administered 2023-12-04: 10 mL via INTRADERMAL
  Filled 2023-12-04: qty 10

## 2023-12-04 MED ORDER — ACETAMINOPHEN 500 MG PO TABS
1000.0000 mg | ORAL_TABLET | Freq: Once | ORAL | Status: AC
  Administered 2023-12-04: 1000 mg via ORAL
  Filled 2023-12-04: qty 2

## 2023-12-04 MED ORDER — CILOSTAZOL 100 MG PO TABS
100.0000 mg | ORAL_TABLET | Freq: Two times a day (BID) | ORAL | Status: DC
  Administered 2023-12-04 – 2023-12-05 (×2): 100 mg via ORAL
  Filled 2023-12-04 (×3): qty 1

## 2023-12-04 NOTE — Procedures (Signed)
 Procedure Date:  12/04/23 Procedure: Occipital Nerve Block, bi  Pre-procedure Diagnosis: Headache  Post-procedure Diagnosis: same as above  Prior to Procedure:  Informed Consent: The risks, benefits, indications, potential complications, and alternatives were explained to the patient/family and informed consent obtained.  Attending Staff:  me Resident/Fellow: none Skin Prep: Cleansed with alcohol.  Anesthesia: 4ml Lidocaine  1% without epinephrine without added sodium bicarbonate, decadron 1ml (4mg ) Indications: 68 yo M here for headache. The identity of the patient was confirmed and a bedside time out was performed.  Description of Procedure: Pt's skin was prepped with alcohol and lidocaine  1% 4cc and decadron 4mg /ml 1cc was injected over the occipital ridge in a fan-like fashion with appropriate procedure on the left. Pt had immediate relief. Pt stated that R side occipital palpation not significant now and would like to hold off R occipital nerve block this time Findings: immediate HA relief  Complications: The patient tolerated the procedure well with no complications.  Specimens:none  Estimated blood loss: zero    Ary Cummins, MD PhD Stroke Neurology 12/04/2023 7:05 PM

## 2023-12-04 NOTE — Plan of Care (Signed)
 Patient reports that some things are hard to change but he will try his best.  Problem: Education: Goal: Knowledge of General Education information will improve Description: Including pain rating scale, medication(s)/side effects and non-pharmacologic comfort measures Outcome: Progressing   Problem: Health Behavior/Discharge Planning: Goal: Ability to manage health-related needs will improve Outcome: Progressing   Problem: Clinical Measurements: Goal: Ability to maintain clinical measurements within normal limits will improve Outcome: Progressing

## 2023-12-04 NOTE — Progress Notes (Addendum)
 STROKE TEAM PROGRESS NOTE   SUBJECTIVE (INTERVAL HISTORY) His wife and granddaughter are at the bedside.  Overall his condition is gradually improving. Stated that HA now mild but continued to have intermittent posterior headache. On exam, he has focal tenderness on b/l occipital nerve palpation    OBJECTIVE Temp:  [97.8 F (36.6 C)-98.1 F (36.7 C)] 97.9 F (36.6 C) (11/29 1220) Pulse Rate:  [87-93] 91 (11/29 1220) Cardiac Rhythm: Normal sinus rhythm (11/29 0900) Resp:  [16-18] 18 (11/29 1220) BP: (138-154)/(79-93) 140/81 (11/29 1220) SpO2:  [97 %-99 %] 98 % (11/29 1220) Weight:  [85.5 kg] 85.5 kg (11/29 0507)  Recent Labs  Lab 12/03/23 2136 12/04/23 0641 12/04/23 1704  GLUCAP 298* 231* 245*   Recent Labs  Lab 12/03/23 1027 12/04/23 0545  NA 135 136  K 4.7 3.7  CL 98 102  CO2 27 26  GLUCOSE 357* 240*  BUN 19 12  CREATININE 0.82 0.67  CALCIUM  9.5 8.9   Recent Labs  Lab 12/03/23 1552  AST 13*  ALT 12  ALKPHOS 127*  BILITOT 0.5  PROT 7.5  ALBUMIN 4.2   Recent Labs  Lab 12/03/23 1027 12/04/23 0545  WBC 8.9 6.8  NEUTROABS 6.7  --   HGB 12.8* 11.6*  HCT 38.7* 34.1*  MCV 82.3 80.2  PLT 147* 126*   No results for input(s): CKTOTAL, CKMB, CKMBINDEX, TROPONINI in the last 168 hours. Recent Labs    12/03/23 1027  LABPROT 14.0  INR 1.0   No results for input(s): COLORURINE, LABSPEC, PHURINE, GLUCOSEU, HGBUR, BILIRUBINUR, KETONESUR, PROTEINUR, UROBILINOGEN, NITRITE, LEUKOCYTESUR in the last 72 hours.  Invalid input(s): APPERANCEUR     Component Value Date/Time   CHOL 189 11/20/2023 0449   CHOL 169 03/17/2021 1259   TRIG 121 11/20/2023 0449   HDL 30 (L) 11/20/2023 0449   HDL 39 (L) 03/17/2021 1259   CHOLHDL 6.3 11/20/2023 0449   VLDL 24 11/20/2023 0449   LDLCALC 135 (H) 11/20/2023 0449   LDLCALC 105 (H) 03/17/2021 1259   Lab Results  Component Value Date   HGBA1C 12.1 (H) 11/18/2023      Component Value Date/Time    LABOPIA NONE DETECTED 11/20/2023 0816   COCAINSCRNUR NONE DETECTED 11/20/2023 0816   LABBENZ NONE DETECTED 11/20/2023 0816   AMPHETMU NONE DETECTED 11/20/2023 0816   THCU NONE DETECTED 11/20/2023 0816   LABBARB POSITIVE (A) 11/20/2023 0816    No results for input(s): ETH in the last 168 hours.  I have personally reviewed the radiological images below and agree with the radiology interpretations.  MR Brain W and Wo Contrast Result Date: 12/03/2023 EXAM: MRI BRAIN WITH AND WITHOUT CONTRAST 12/03/2023 02:55:32 PM TECHNIQUE: Multiplanar multisequence MRI of the head/brain was performed with and without the administration of intravenous contrast. COMPARISON: Comparison to earlier same day brain MRI (12/03/2023) and brain MRI from 11/19/2023. A few small foci of subacute infarct are present in the right centrum semiovale and right parietal lobe, corresponding to infarcts seen on the MRI from 11/19/2023. Additional punctate infarcts in the right parietal lobe, representing late acute versus subacute infarcts, were seen on the earlier same day MRI but were not present on the 11/19/2023 study. No new areas of acute infarct are appreciated compared to the earlier same day MRI. Similar appearance of left temporal lobe and bilateral basal ganglia remote infarcts. CLINICAL HISTORY: Headache, increasing frequency or severity. FINDINGS: BRAIN AND VENTRICLES: A few small foci of subacute infarct are present in the right centrum semiovale  and right parietal lobe, corresponding to infarcts seen on the MRI from 11/19/2023. Additional punctate infarcts in the right parietal lobe represent late acute versus subacute infarcts, which were seen on the earlier same day MRI but were not present on the 11/19/2023 study. No new areas of acute infarct are appreciated compared to the earlier same day MRI. Similar appearance of left temporal lobe and bilateral basal ganglia remote infarcts. Similar appearance of chronic  microvascular ischemic changes and parenchymal volume loss. No significant edema. No acute intracranial hemorrhage. No mass effect or midline shift. No hydrocephalus. The sella is unremarkable. Normal flow voids. Focal enhancement is present in the right parietal lobe at a site of infarct noted on the 11/19/2023 MRI, likely reflecting enhancement in the setting of subacute infarct seen on series 17, image 113. Similar susceptibility in the left temporal lobe. ORBITS: No acute abnormality. SINUSES: Mucosal thickening in the ethmoid sinuses. Trace fluid in the mastoid air cells. BONES AND SOFT TISSUES: Left-sided craniotomy changes. Normal bone marrow signal and enhancement. No acute soft tissue abnormality. IMPRESSION: 1. No new areas of acute infarct. 2. Subacute infarcts in the right centrum semiovale and right parietal lobe, with additional late acute versus subacute punctate infarcts in the right parietal lobe. 3. Focal enhancement in the right parietal lobe at a site of infarct, likely reflecting enhancement in the setting of subacute infarct. 4. Similar appearance of left temporal lobe and bilateral basal ganglia remote infarcts. 5. Similar appearance of chronic microvascular ischemic changes and parenchymal volume loss. No significant edema or midline shift. Electronically signed by: Donnice Mania MD 12/03/2023 06:16 PM EST RP Workstation: [id number]   MR BRAIN WO CONTRAST Result Date: 12/03/2023 EXAM: MRI BRAIN WITHOUT CONTRAST 12/03/2023 12:42:12 PM TECHNIQUE: Multiplanar multisequence MRI of the head/brain was performed without the administration of intravenous contrast. COMPARISON: MRI head 11/19/2023 and earlier same day CTA head and neck. CLINICAL HISTORY: Neuro deficit, acute, stroke suspected. FINDINGS: BRAIN AND VENTRICLES: There are multiple small foci of diffusion signal abnormality in the right centrum semiovale and right parietal lobe corresponding to infarcts seen on the prior MRI; these  infarcts are now subacute in appearance. There are a few additional punctate areas of infarct within the right parietal lobe which appear late acute versus subacute and were not seen on the prior MRI. These infarcts are noted on series 5 images 34 and 35. Encephalomalacia and gliosis in the left MCA territory primarily involving the left temporal lobe compatible with remote infarct. There are additional remote lacunar infarcts in the left basal ganglia and a more prominent remote lacunar infarct in the right lentiform nucleus. Scattered white matter signal abnormalities suggestive of mild chronic microvascular ischemic changes. There is mild age related volume loss. No intracranial hemorrhage. No mass. No midline shift. No hydrocephalus. The sella is unremarkable. Normal flow voids. ORBITS: No acute abnormality. SINUSES AND MASTOIDS: Mild mucosal thickening in the right ethmoid sinus. Trace fluid in the mastoid air cells. BONES AND SOFT TISSUES: Normal marrow signal. No acute soft tissue abnormality. IMPRESSION: 1. Multiple small foci of diffusion signal abnormality in the right centrum semiovale and right parietal lobe corresponding to prior infarcts, now subacute in appearance. 2. A few additional punctate infarcts within the right parietal lobe, late acute versus subacute, not seen on the prior MRI. 3. Encephalomalacia and gliosis in the left MCA territory primarily involving the left temporal lobe, compatible with remote infarct. 4. Additional remote lacunar infarcts in the left basal ganglia and a more prominent  remote lacunar infarct in the right lentiform nucleus. Electronically signed by: Donnice Mania MD 12/03/2023 01:19 PM EST RP Workstation: [id number]   CT ANGIO HEAD NECK W WO CM Result Date: 12/03/2023 EXAM: CTA HEAD AND NECK WITHOUT AND WITH 12/03/2023 12:03:53 PM TECHNIQUE: CTA of the head and neck was performed without and with the administration of 75 mL of iohexol  (OMNIPAQUE ) 350 MG/ML  injection. Multiplanar 2D and/or 3D reformatted images are provided for review. Automated exposure control, iterative reconstruction, and/or weight based adjustment of the mA/kV was utilized to reduce the radiation dose to as low as reasonably achievable. Stenosis of the internal carotid arteries measured using NASCET criteria. COMPARISON: MRI head 11/19/2023 CLINICAL HISTORY: Neuro deficit, acute, stroke suspected. FINDINGS: CTA NECK: AORTIC ARCH AND ARCH VESSELS: Mild atherosclerosis of the visualized aortic arch. The left vertebral artery originates on the aortic arch. No dissection or arterial injury. No significant stenosis of the brachiocephalic or subclavian arteries. CERVICAL CAROTID ARTERIES: The right carotid artery is patent from the origin to the skull base. Mild atherosclerosis at the right carotid bifurcation without hemodynamically significant stenosis. The left carotid artery is patent from the origin to the skull base. No hemodynamically significant stenosis. No dissection or arterial injury. CERVICAL VERTEBRAL ARTERIES: The right vertebral artery is dominant. The left vertebral artery originates on the aortic arch. Vertebral arteries are patent from the origins to the vertebrobasilar confluence. No dissection, arterial injury, or significant stenosis. LUNGS AND MEDIASTINUM: Unremarkable. SOFT TISSUES: No acute abnormality. BONES: Degenerative changes in the visualized spine. Edentulous maxilla and mandible. Left frontotemporal craniotomy. Mild mucosal thickening in the ethmoid sinuses, right greater than left. CTA HEAD: ANTERIOR CIRCULATION: Intracranial internal carotid arteries are patent bilaterally. Atherosclerosis of the carotid siphons. There is focal moderate-to-severe narrowing at the left supraclinoid right ICA without significant calcified atherosclerosis at this level. Diminutive A1 segment of the left ACA which is likely congenital. There is irregularity and mild narrowing of the right  A1 segment. The anterior cerebral arteries are patent bilaterally. There is focal severe stenosis of the proximal M1 segment of the left MCA. Additional irregularity of the distal left M1 segment. Diminutive caliber of multiple M2 and M3 branches of the left MCA likely related to prior infarct. There is focal severe stenosis of an M3 branch of the left MCA within the posterior aspect of the sylvian fissure. There is mild narrowing and distal tapering of the right M1 segment. There is moderate posterior stenosis of a proximal infarct branch of the right MCA. No aneurysm. POSTERIOR CIRCULATION: There is short segment moderate to severe stenosis of the anterior P2 segment of the right PCA. The posterior P2 segment of the right PCA is diminutive in caliber with irregularity and in multiple areas of moderate stenosis. Irregularity and moderate stenosis of the P2 segment of the left PCA. No significant stenosis of the basilar artery. No significant stenosis of the vertebral arteries. No aneurysm. OTHER: Redemonstrated encephalomalacia in the left MCA territory primarily involving the left temporal lobe. Additional remote infarcts in the bilateral basal ganglia. There is mild ex vacuo dilatation of the left temporal horn. No midline shift. The basilar cisterns are patent. No dural venous sinus thrombosis on this non-dedicated study. IMPRESSION: 1. No acute large vessel occlusion. 2. Multifocal intracranial arterial stenoses, similar to prior. 3. Similar moderate-to-severe stenosis of the supraclinoid left ICA. 4. Focal severe stenosis of the proximal M1 segment of the left MCA with additional distal M1 irregularity and diminutive caliber of multiple M2 and  M3 branches of the left MCA, likely related to prior infarct. 5. Focal severe stenosis of an M3 branch of the left MCA within the posterior aspect of the sylvian fissure. 6. Mild narrowing and distal tapering of the right M1 segment with moderate-to-severe stenosis of a  proximal M3 branch. 7. Short segment moderate to severe stenosis of the anterior P2 segment of the right PCA, with diminutive caliber and irregularity of the posterior P2 segment. Irregularity with moderate stenosis of the left PCA P2 segment. Electronically signed by: Donnice Mania MD 12/03/2023 12:24 PM EST RP Workstation: [id number]   ECHOCARDIOGRAM COMPLETE Result Date: 11/20/2023    ECHOCARDIOGRAM REPORT   Patient Name:   AUGUST LONGEST Date of Exam: 11/20/2023 Medical Rec #:  [id number]      Height:       71.0 in Accession #:    [id number]     Weight:       195.0 lb Date of Birth:  03-13-1955       BSA:          2.086 m Patient Age:    68 years       BP:           128/89 mmHg Patient Gender: M              HR:           68 bpm. Exam Location:  Inpatient Procedure: 2D Echo (Both Spectral and Color Flow Doppler were utilized during            procedure). Indications:   Stroke  History:       Patient has no prior history of Echocardiogram examinations.                Stroke.  Sonographer:   Norleen Amour Referring      CORTNEY E DE LA TORRE Phys: IMPRESSIONS  1. Left ventricular ejection fraction, by estimation, is 60 to 65%. The left ventricle has normal function. The left ventricle has no regional wall motion abnormalities. There is mild left ventricular hypertrophy. Left ventricular diastolic parameters are consistent with Grade I diastolic dysfunction (impaired relaxation).  2. Right ventricular systolic function is normal. The right ventricular size is normal. Tricuspid regurgitation signal is inadequate for assessing PA pressure.  3. The mitral valve is normal in structure. Trivial mitral valve regurgitation. No evidence of mitral stenosis.  4. The aortic valve is tricuspid. Aortic valve regurgitation is trivial. Aortic valve sclerosis is present, with no evidence of aortic valve stenosis.  5. The inferior vena cava is normal in size with greater than 50% respiratory variability, suggesting right atrial  pressure of 3 mmHg. FINDINGS  Left Ventricle: Left ventricular ejection fraction, by estimation, is 60 to 65%. The left ventricle has normal function. The left ventricle has no regional wall motion abnormalities. The left ventricular internal cavity size was normal in size. There is  mild left ventricular hypertrophy. Left ventricular diastolic parameters are consistent with Grade I diastolic dysfunction (impaired relaxation). Right Ventricle: The right ventricular size is normal. No increase in right ventricular wall thickness. Right ventricular systolic function is normal. Tricuspid regurgitation signal is inadequate for assessing PA pressure. Left Atrium: Left atrial size was normal in size. Right Atrium: Right atrial size was normal in size. Pericardium: There is no evidence of pericardial effusion. Mitral Valve: The mitral valve is normal in structure. Trivial mitral valve regurgitation. No evidence of mitral valve stenosis. MV peak gradient, 3.9 mmHg. The mean  mitral valve gradient is 1.0 mmHg. Tricuspid Valve: The tricuspid valve is normal in structure. Tricuspid valve regurgitation is trivial. Aortic Valve: The aortic valve is tricuspid. Aortic valve regurgitation is trivial. Aortic valve sclerosis is present, with no evidence of aortic valve stenosis. Pulmonic Valve: The pulmonic valve was not well visualized. Pulmonic valve regurgitation is trivial. Aorta: The aortic root and ascending aorta are structurally normal, with no evidence of dilitation. Venous: The inferior vena cava is normal in size with greater than 50% respiratory variability, suggesting right atrial pressure of 3 mmHg. IAS/Shunts: The interatrial septum was not well visualized.  LEFT VENTRICLE PLAX 2D LVIDd:         4.40 cm      Diastology LVIDs:         3.20 cm      LV e' medial:    5.11 cm/s LV PW:         1.10 cm      LV E/e' medial:  11.2 LV IVS:        0.90 cm      LV e' lateral:   6.09 cm/s LVOT diam:     2.30 cm      LV E/e' lateral:  9.4 LV SV:         89 LV SV Index:   43 LVOT Area:     4.15 cm  LV Volumes (MOD) LV vol d, MOD A2C: 99.1 ml LV vol d, MOD A4C: 104.0 ml LV vol s, MOD A2C: 35.3 ml LV vol s, MOD A4C: 38.2 ml LV SV MOD A2C:     63.8 ml LV SV MOD A4C:     104.0 ml LV SV MOD BP:      68.6 ml RIGHT VENTRICLE             IVC RV Basal diam:  2.80 cm     IVC diam: 1.40 cm RV S prime:     18.00 cm/s TAPSE (M-mode): 2.3 cm      PULMONARY VEINS                             Diastolic Velocity: 35.10 cm/s                             S/D Velocity:       2.00                             Systolic Velocity:  69.80 cm/s LEFT ATRIUM           Index        RIGHT ATRIUM           Index LA diam:      2.70 cm 1.29 cm/m   RA Area:     14.10 cm LA Vol (A2C): 33.6 ml 16.11 ml/m  RA Volume:   33.80 ml  16.20 ml/m LA Vol (A4C): 52.3 ml 25.07 ml/m  AORTIC VALVE             PULMONIC VALVE LVOT Vmax:   91.80 cm/s  PV Vmax:       0.79 m/s LVOT Vmean:  60.700 cm/s PV Peak grad:  2.5 mmHg LVOT VTI:    0.214 m  AORTA Ao Root diam: 3.30 cm Ao Asc diam:  3.50 cm MITRAL VALVE MV Area (PHT): 3.01  cm    SHUNTS MV Area VTI:   2.92 cm    Systemic VTI:  0.21 m MV Peak grad:  3.9 mmHg    Systemic Diam: 2.30 cm MV Mean grad:  1.0 mmHg MV Vmax:       0.99 m/s MV Vmean:      50.7 cm/s MV Decel Time: 252 msec MV E velocity: 57.10 cm/s MV A velocity: 94.90 cm/s MV E/A ratio:  0.60 Lonni Nanas MD Electronically signed by Lonni Nanas MD Signature Date/Time: 11/20/2023/2:52:39 PM    Final    MR CERVICAL SPINE WO CONTRAST Result Date: 11/19/2023 EXAM: MRI CERVICAL SPINE WITHOUT CONTRAST 11/19/2023 08:50:48 AM TECHNIQUE: Multiplanar multisequence MRI of the cervical spine was performed. COMPARISON: Cervical spine CT 03/30/22. 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 Moderate degenerative changes of the C1-C2 articulation. Normal vertebral body heights.  Bone marrow signal is unremarkable. SPINAL CORD: Normal spinal cord size. No abnormal spinal cord signal. SOFT TISSUES: No paraspinal mass. C2-C3: Congenital nonsegmentation. C3-C4: Right greater than left facet arthropathy and uncovertebral joint spurring result in moderate right neural foraminal narrowing. C4-C5: Mild bilateral facet arthropathy. C5-C6: Moderate disc height loss, small disc bulge without significant spinal canal stenosis. Left greater than right facet arthropathy and uncovertebral joint spurring result in severe left neural foraminal narrowing. C6-C7: Moderate disc height loss, small disc bulge without significant spinal canal stenosis. Facet arthropathy and uncovertebral joint spurring contribute to moderate bilateral neural foraminal narrowing. C7-T1: Right greater than left facet arthropathy. No spinal canal stenosis or neural foraminal narrowing. IMPRESSION: 1. Multilevel cervical spondylosis with the most severe involvement at C5-6, where severe left neural foraminal narrowing is present. 2. At C3-4, moderate right neural foraminal narrowing. 3. At C6-7, moderate bilateral neural foraminal narrowing. Electronically signed by: Ryan Chess MD 11/19/2023 09:04 AM EST RP Workstation: [id number]   MR BRAIN W WO CONTRAST Result Date: 11/19/2023 EXAM: MRI BRAIN WITH AND WITHOUT CONTRAST 11/19/2023 08:50:48 AM TECHNIQUE: Multiplanar multisequence MRI of the head/brain was performed with and without the administration of 7.5 mL gadobutrol  (GADAVIST ) 1 MMOL/ML injection. COMPARISON: CT head 11/18/2023. CLINICAL HISTORY: Headache, increasing frequency or severity. FINDINGS: BRAIN AND VENTRICLES: Small foci of acute and subacute infarcts in the superior right parietal lobe and right centrum semiovale. 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 No acute intracranial hemorrhage. No mass effect or midline shift. No hydrocephalus. The sella is unremarkable. Normal flow voids.  No mass or abnormal enhancement. Prior left frontoparietal craniotomy. Background of mild to moderate chronic small vessel disease. ORBITS: No acute abnormality. SINUSES: No acute abnormality. BONES AND SOFT TISSUES: Normal bone marrow signal and enhancement. No acute soft tissue abnormality. IMPRESSION: 1. Small foci of acute and subacute infarcts in the superior right parietal lobe and right centrum semiovale. 2. 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 Electronically signed by: Ryan Chess MD 11/19/2023 09:01 AM EST RP Workstation: [id number]   CT ANGIO HEAD NECK W WO CM Result Date: 11/18/2023 CLINICAL DATA:  Initial evaluation for acute headache. EXAM: CT ANGIOGRAPHY HEAD AND NECK WITH AND WITHOUT CONTRAST TECHNIQUE: Multidetector CT imaging of the head and neck was performed using the standard protocol during bolus administration of intravenous contrast. Multiplanar CT image reconstructions and MIPs were obtained to evaluate the vascular anatomy. Carotid stenosis measurements (when applicable) are obtained utilizing NASCET criteria, using the distal internal carotid diameter  as the denominator. RADIATION DOSE REDUCTION: This exam was performed according to the departmental dose-optimization program which includes automated exposure control, adjustment of the mA and/or kV according to patient size and/or use of iterative reconstruction technique. CONTRAST:  75mL OMNIPAQUE  IOHEXOL  350 MG/ML SOLN COMPARISON:  Prior study from 03/30/2022. FINDINGS: CT HEAD FINDINGS Brain: Postoperative changes from prior left sided craniotomy. Chronic encephalomalacia within the underlying left frontotemporal region. Few remote lacunar infarcts present about the bilateral basal ganglia. No acute intracranial hemorrhage. No acute large vessel territory infarct. No mass lesion or midline shift. Ex vacuo dilatation of the left lateral ventricle without hydrocephalus. No extra-axial fluid collection.  Vascular: No abnormal hyperdense vessel. Scattered vascular calcifications noted within the carotid siphons. Skull: Scalp soft tissues within normal limits. Prior left craniotomy. Calvarium otherwise intact. Sinuses/Orbits: Globes orbital soft tissues within normal limits. Scattered mucosal thickening present about the ethmoidal air cells. No significant mastoid effusion. Other: None. Review of the MIP images confirms the above findings CTA NECK FINDINGS Aortic arch: Visualized aortic arch within normal limits for caliber with standard branch pattern. Aortic atherosclerosis. No significant stenosis about the origin the great vessels. Right carotid system: Right common and internal carotid arteries are patent without dissection. Mild atheromatous change about the right carotid bulb without stenosis. Left carotid system: Left common and internal carotid arteries are patent without dissection. Mild atheromatous change about the left carotid bulb without stenosis. Vertebral arteries: Left vertebral artery arises directly from the aortic arch. Right vertebral artery dominant. Vertebral arteries are patent without stenosis or dissection. Skeleton: No worrisome osseous lesions. 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 Moderate spondylosis at C5-6 and C6-7. Patient is edentulous. Other neck: No other acute finding. Upper chest: No other acute finding. Review of the MIP images confirms the above findings CTA HEAD FINDINGS Anterior circulation: Atheromatous irregularity about the carotid siphons bilaterally. There is a new/progressive moderate stenosis involving the supraclinoid left ICA (series 15, image 109). A1 segments patent bilaterally, with the left being hypoplastic. Normal anterior communicating artery complex. ACAs patent to their distal aspects without significant stenosis. No M1 occlusion. New and/or progressive severe stenoses about the proximal left M1 segment (series 21, image 19). As well as the right MCA  bifurcation/proximal M2 segment (series 21, image 19). No large vessel occlusion. Distal MCA branches perfused and fairly symmetric, although demonstrate scattered atheromatous change, most notable about a distal left M3 branch (series 21, image 17). Posterior circulation: Both V4 segments patent without stenosis. Right vertebral artery dominant. Both PICA grossly patent at their origins. Basilar patent without stenosis. Superior cerebral arteries patent bilaterally. Both PCAs primarily supplied via the basilar. Multifocal severe bilateral P2 stenoses, mildly progressed from prior. PCAs remain patent to their distal aspects. Venous sinuses: Patent allowing for timing the contrast bolus. Anatomic variants: As above.  No aneurysm. Review of the MIP images confirms the above findings IMPRESSION: CT HEAD: 1. No acute intracranial abnormality. 2. Postoperative changes from prior left craniotomy with chronic encephalomalacia within the underlying left frontotemporal region. 3. Few remote lacunar infarcts about the bilateral basal ganglia. CTA HEAD AND NECK: 1. Negative CTA for acute large vessel occlusion. 2. New and/or progressive moderate to severe stenoses about the supraclinoid left ICA, proximal left M1 segment, right MCA bifurcation/proximal M2 segment, and bilateral P2 segments as above. While these findings could reflect progressive atherosclerotic disease, possible changes of vasculitis and/or RCVS could also be considered given the history of headache. 3. Mild atheromatous change about the carotid bifurcations.  No hemodynamically significant stenosis within the neck. Aortic Atherosclerosis (ICD10-I70.0). Electronically Signed   By: Morene Hoard M.D.   On: 11/18/2023 23:25   CT VENOGRAM HEAD Result Date: 11/18/2023 CLINICAL DATA:  Initial evaluation for dural venous sinus thrombosis suspected. EXAM: CT VENOGRAM HEAD TECHNIQUE: Venographic phase images of the brain were obtained following the  administration of intravenous contrast. Multiplanar reformats and maximum intensity projections were generated. RADIATION DOSE REDUCTION: This exam was performed according to the departmental dose-optimization program which includes automated exposure control, adjustment of the mA and/or kV according to patient size and/or use of iterative reconstruction technique. CONTRAST:  75mL OMNIPAQUE  IOHEXOL  350 MG/ML SOLN COMPARISON:  Concomitant CT performed at the same time. FINDINGS: Normal enhancement seen throughout the superior sagittal sinus to the torcula. Transverse and sigmoid sinuses are patent as are the jugular bulbs and visualized proximal internal jugular veins. Straight sinus, vein of Galen, and internal cerebral veins are patent. No evidence for dural venous sinus thrombosis. No appreciable cortical vein abnormality. No appreciable abnormality about the cavernous sinus. IMPRESSION: Negative CT venogram. No evidence for dural venous sinus thrombosis. Electronically Signed   By: Morene Hoard M.D.   On: 11/18/2023 23:02     PHYSICAL EXAM  Temp:  [97.8 F (36.6 C)-98.1 F (36.7 C)] 97.9 F (36.6 C) (11/29 1220) Pulse Rate:  [87-93] 91 (11/29 1220) Resp:  [16-18] 18 (11/29 1220) BP: (138-154)/(79-93) 140/81 (11/29 1220) SpO2:  [97 %-99 %] 98 % (11/29 1220) Weight:  [85.5 kg] 85.5 kg (11/29 0507)  General - Well nourished, well developed, in no apparent distress.  Ophthalmologic - fundi not visualized due to noncooperation.  Cardiovascular - Regular rhythm and rate.  Mental Status -  Level of arousal and orientation to time, place, and person were intact. Language including expression, naming, repetition, comprehension was assessed and found intact. Fund of Knowledge was assessed and was intact.  Cranial Nerves II - XII - II - Visual field intact OU. III, IV, VI - Extraocular movements intact. V - Facial sensation intact bilaterally. VII - Facial movement intact  bilaterally. VIII - Hearing & vestibular intact bilaterally. X - Palate elevates symmetrically. XI - Chin turning & shoulder shrug intact bilaterally. XII - Tongue protrusion intact.  Motor Strength - The patient's strength was normal in all extremities and pronator drift was absent.  Bulk was normal and fasciculations were absent.   Motor Tone - Muscle tone was assessed at the neck and appendages and was normal.  Reflexes - The patient's reflexes were symmetrical in all extremities and he had no pathological reflexes.  Sensory - Light touch, temperature/pinprick were assessed and were symmetrical.    Coordination - The patient had normal movements in the hands and feet with no ataxia or dysmetria.  Tremor was absent.  Gait and Station - deferred.   ASSESSMENT/PLAN Mr. MICIAH COVELLI is a 68 y.o. male with history of hypertension, hyperlipidemia, diabetes, AVM rupture status post resection in 1997 with gamma knife in 2007 for residual over the L superior temporal gyrus with feeder from L MCA, chronic headache, CAD with MI in 2008, recent stroke admitted for HA. No TNK given due to outside window.    Stroke:  right parietal a new punctate infarct from last admission, most likely secondary to microvascular inflammation related to uncontrolled diabetes. Embolic stroke vs. Intracranial stenosis are in the DDx. Unlikely CNS vasculitis  MRI  Multiple small foci of diffusion signal abnormality in the right centrum semiovale and right  parietal lobe corresponding to prior infarcts, now subacute in appearance. CT head and neck multifocal intracranial artery stenosis, similar to prior (see below) CSF WBC 1, RBC 12, protein 60, glucose 146 - not consistent with CNS vasculitis and not candidate for steroid treatment given uncontrolled diabetes Autoimmune labs pending SCDs for VTE prophylaxis aspirin  81 mg daily and clopidogrel  75 mg daily prior to admission, now on clopidogrel  75 mg daily and Pletal  100mg  bid, which has effects on reduce oxidative stress in vascular tissues. Patient counseled to be compliant with his antithrombotic medications Ongoing aggressive stroke risk factor management Therapy recommendations:  pending Disposition:  pending  Recent stroke  Admitted 11/19/2023 for headache. MRI showed incidental small foci of acute and subacute infarcts in superior right parietal lobe and right centrum semiovale.  CT head No acute abnormality. CTA head & neck Moderate to severe stenoses about supraclinoid left ICA, proximal left M1 segment, right MCA bifurcation/proximal M2 segment and bilateral P2 segments, possibly reflecting changes of vasculitis or RCVS. 2D Echo EF 60-65%, LDL 135 and HgbA1c 12.1.  Discharged on DAPT for 3 months and Crestor  20. Was considered etiology as intracranial stenosis, but now retrospectively, more concerning for microvascular inflammation from uncontrolled diabetes.  Cervical radiculopathy Occipital neuralgia, bilateral Presented with back of head pain Exam showed bilateral occipital nerve tenderness on palpation MRI C-Spine - Multilevel cervical spondylosis with the most severe involvement at 5-6, where severe left neural foraminal narrowing is present.  At C3-4, moderate right neural foraminal narrowing. At C6-7, moderate bilateral neural foraminal narrowing.  Will do bilateral occipital nerve block    Intracranial stenosis 03/2022 CTA head and neck showed L M2 short segment occlusion, severe R P1, L P2+P3 Current CTA head and neck x 2 Moderate to severe stenoses about supraclinoid left ICA, proximal left M1 segment, right MCA bifurcation/proximal M2 segment and bilateral P2 segments On DAPT   Diabetes type II Uncontrolled HgbA1c 12.1, goal < 7.0 On insulin  CBGs, SSI Recommend close follow-up with PCP for better DM control  Hx of AVM s/p resection Hx of AVM rupture s/p resection in 1997 with gamma knife in 2007 for residual over the L superior  temporal gyrus with feeder from L MCA CT Postoperative changes from prior left craniotomy with chronic encephalomalacia within the underlying left frontotemporal region   Hypertension Home meds:  None Stable  Long term BP goal normotensive   Hyperlipidemia LDL 135, goal < 70 Add Crestor  20mg  Continue statin at discharge   Other stroke risk factors Advanced age    Other Active Problems    Hospital day # 1  I discussed with Dr. Sonjia. I spent extensive total face-to-face time with the patient and wife and granddaughter, reviewing test results, images and medication, and discussing the diagnosis, treatment plan and potential prognosis. This patient's care requiresreview of multiple databases, neurological assessment, discussion with family, other specialists and medical decision making of high complexity.  Ary Cummins, MD PhD Stroke Neurology 12/04/2023 5:46 PM    To contact Stroke Continuity provider, please refer to [email or web address]. After hours, contact General Neurology
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
## 2023-12-05 DIAGNOSIS — I6310 Cerebral infarction due to embolism of unspecified precerebral artery: Secondary | ICD-10-CM | POA: Diagnosis not present

## 2023-12-05 DIAGNOSIS — I639 Cerebral infarction, unspecified: Secondary | ICD-10-CM | POA: Diagnosis not present

## 2023-12-05 DIAGNOSIS — I6340 Cerebral infarction due to embolism of unspecified cerebral artery: Secondary | ICD-10-CM | POA: Diagnosis not present

## 2023-12-05 LAB — GLUCOSE, CAPILLARY: Glucose-Capillary: 335 mg/dL — ABNORMAL HIGH (ref 70–99)

## 2023-12-05 LAB — BASIC METABOLIC PANEL WITH GFR
Anion gap: 11 (ref 5–15)
BUN: 19 mg/dL (ref 8–23)
CO2: 24 mmol/L (ref 22–32)
Calcium: 9.6 mg/dL (ref 8.9–10.3)
Chloride: 100 mmol/L (ref 98–111)
Creatinine, Ser: 0.78 mg/dL (ref 0.61–1.24)
GFR, Estimated: >60 mL/min (ref >60)
Glucose, Bld: 349 mg/dL — ABNORMAL HIGH (ref 70–99)
Potassium: 4.4 mmol/L (ref 3.5–5.1)
Sodium: 135 mmol/L (ref 135–145)

## 2023-12-05 LAB — CBC
HCT: 35.9 % — ABNORMAL LOW (ref 39.0–52.0)
Hemoglobin: 12.4 g/dL — ABNORMAL LOW (ref 13.0–17.0)
MCH: 27.4 pg (ref 26.0–34.0)
MCHC: 34.5 g/dL (ref 30.0–36.0)
MCV: 79.2 fL — ABNORMAL LOW (ref 80.0–100.0)
Platelets: 119 K/uL — ABNORMAL LOW (ref 150–400)
RBC: 4.53 MIL/uL (ref 4.22–5.81)
RDW: 12.1 % (ref 11.5–15.5)
WBC: 7.2 K/uL (ref 4.0–10.5)
nRBC: 0 % (ref 0.0–0.2)

## 2023-12-05 LAB — MAGNESIUM: Magnesium: 2 mg/dL (ref 1.7–2.4)

## 2023-12-05 LAB — LD, BODY FLUID (OTHER): LD, Body Fluid: 30 IU/L

## 2023-12-05 LAB — LYME DISEASE SEROLOGY W/REFLEX: Lyme Total Antibody EIA: NEGATIVE

## 2023-12-05 MED ORDER — CILOSTAZOL 100 MG PO TABS: 100.0000 mg | ORAL_TABLET | Freq: Two times a day (BID) | ORAL | 2 refills | Status: AC

## 2023-12-05 NOTE — Plan of Care (Signed)
  Problem: Skin Integrity: Goal: Risk for impaired skin integrity will decrease Outcome: Adequate for Discharge   Problem: Education: Goal: Ability to describe self-care measures that may prevent or decrease complications (Diabetes Survival Skills Education) will improve Outcome: Adequate for Discharge Goal: Individualized Educational Video(s) Outcome: Adequate for Discharge   Problem: Coping: Goal: Ability to adjust to condition or change in health will improve Outcome: Adequate for Discharge   Problem: Fluid Volume: Goal: Ability to maintain a balanced intake and output will improve Outcome: Adequate for Discharge   Problem: Health Behavior/Discharge Planning: Goal: Ability to identify and utilize available resources and services will improve Outcome: Adequate for Discharge Goal: Ability to manage health-related needs will improve Outcome: Adequate for Discharge   Problem: Activity: Goal: Risk for activity intolerance will decrease Outcome: Adequate for Discharge   Problem: Education: Goal: Knowledge of General Education information will improve Description: Including pain rating scale, medication(s)/side effects and non-pharmacologic comfort measures Outcome: Adequate for Discharge

## 2023-12-05 NOTE — Discharge Summary (Signed)
 Physician Discharge Summary  Dustin Roberts FMW:[id number] DOB: 04-09-1955 DOA: 12/03/2023  PCP: Myra Geni ORN, FNP  Admit date: 12/03/2023 Discharge date: 12/05/2023  Admitted From: Home  Discharge disposition: Home   Recommendations for Outpatient Follow-Up:   Follow up with your primary care provider in one week.  Check CBC, BMP, magnesium in the next visit Patient will need good diabetes control as outpatient.  Latest hemoglobin A1c was 12.1 and will need good control to prevent from further microvascular disease.   Discharge Diagnosis:   Principal Problem:   Embolic stroke [facility]) Active Problems:   DM type 2 causing vascular disease (HCC)   Mixed hyperlipidemia   Essential hypertension, benign   Headache   Stroke (cerebrum) (HCC)    Discharge Condition: Improved.  Diet recommendation: Low sodium, heart healthy.  Carbohydrate-modified.   Wound care: None.  Code status: Full.   History of Present Illness:   Dustin Roberts is a 68 y.o. 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  In the ED, patient was noted to have above blood pressure elevated at 201/93.  Initial labs were notable for hemoglobin of 12.8.  BMP with glucose level at 357.  CT head,, CTA of head and neck, MRI brain showed new strokes on imaging compared to 2 weeks prior.    LP was recommended, along with admission to [facility] for further evaluation and treatment.SABRA    Hospital Course:   Following conditions were addressed  during hospitalization as listed below,  Embolic Stroke CTA, MRI of the brain with new areas of restricted diffusion.  Neurology on board.  Vasculitis  workup underway including lumbar puncture.  Patient was initially on aspirin  and Plavix  which has been changed to Plavix  and Pletal on discharge.  Aspirin  will be discontinued..  Continue Crestor .     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 Neurology recommended lumbar puncture for vasculitis workup..  Patient underwent lumbar puncture was negative for meningitis encephalitis  panel.  Hepatitis panel was negative as well.  HIV was nonreactive.  No organisms were seen on the Gram stain and culture was negative   VDRL ANA with reflex ANCA cryoglobulin Lyme disease RPR Bartonella antibody pending at this time.  At this time neurology has seen the patient and okay for discharge.    Diabetes mellitus type 2. Patient will need good glycemic control as outpatient to prevent from further microvascular disease.  Continue Lantus  metformin  and diabetic diet on discharge.  Latest hemoglobin A1c of 12.1.   CAD with h/o MI Continue Plavix , lisinopril  on discharge.   Essential HTN Patient will resume lisinopril  on discharge.  Disposition.  At this time, patient is stable for disposition home with outpatient PCP follow-up.  Medical Consultants:   Neurology  Procedures:    Greater occipital nerve block on 12/04/2022. Subjective:   Today, patient was seen and examined at bedside.  Feels better today no headache no nausea vomiting fever chills or rigor.  Discharge Exam:   Vitals:   12/05/23 9552 12/05/23 9163  BP: 134/60 127/85  Pulse: 97 99  Resp:  16  Temp: 97.6 F (36.4 C) 98.5 F (36.9 C)  SpO2: 99% 99%   Vitals:   12/04/23 1220 12/04/23 1941 12/05/23 0447 12/05/23 0836  BP: (!) 140/81 (!) 152/87 134/60 127/85  Pulse: 91 98 97 99  Resp: 18   16  Temp: 97.9 F (36.6 C) 98 F (36.7 C) 97.6 F (36.4 C) 98.5 F (36.9 C)   TempSrc:  Oral Oral Oral  SpO2: 98% 98% 99% 99%  Weight:      Height:       Body mass index is 26.29 kg/m.  General: Alert awake, not in obvious distress HENT: pupils equally reacting to light,  No scleral pallor or icterus noted. Oral mucosa is moist.  Chest:  Clear breath sounds.No crackles or wheezes.  CVS: S1 &S2 heard. No murmur.  Regular rate and rhythm. Abdomen: Soft, nontender, nondistended.  Bowel sounds are heard.   Extremities: No cyanosis, clubbing or edema.  Peripheral pulses are palpable. Psych: Alert, awake and oriented, normal mood CNS:  No cranial nerve deficits.  Power equal in all extremities.   Skin: Warm and dry.  No rashes noted.  The results of significant diagnostics from this hospitalization (including imaging, microbiology, ancillary and laboratory) are listed below for reference.     Diagnostic Studies:   DG HIP UNILAT WITH PELVIS 2-3 VIEWS RIGHT Result Date: 12/04/2023 CLINICAL DATA:  Right hip pain. EXAM: DG HIP (WITH OR WITHOUT PELVIS) 2-3V*R* COMPARISON:  None Available. FINDINGS: No fracture or bone lesion. Hip joints are normally spaced and aligned. Small marginal osteophytes from the bases of the femoral heads, right greater than left. SI joints and symphysis pubis are normally spaced and aligned. Soft tissues are unremarkable. IMPRESSION: 1. No fracture or acute finding. 2. Mild right and minimal left hip joint degenerative/arthropathic changes. Electronically Signed   By: Alm Parkins M.D.   On: 12/04/2023 18:53   MR Brain W and Wo Contrast Result Date: 12/03/2023 EXAM: MRI BRAIN WITH AND WITHOUT CONTRAST 12/03/2023 02:55:32 PM TECHNIQUE: Multiplanar multisequence MRI of the head/brain was performed with and without the administration of intravenous contrast. COMPARISON: Comparison to earlier same day brain MRI (12/03/2023) and brain MRI from 11/19/2023. A few small foci of subacute infarct are present in the right centrum semiovale and right  parietal lobe, corresponding to infarcts seen on the MRI from 11/19/2023. Additional punctate infarcts in the right parietal lobe, representing late acute versus subacute infarcts, were seen on the earlier same day MRI but were not present on the 11/19/2023 study. No new areas of acute infarct are appreciated compared to the earlier same day MRI. Similar appearance of left temporal lobe and bilateral basal ganglia remote infarcts. CLINICAL HISTORY: Headache, increasing frequency or severity. FINDINGS: BRAIN AND VENTRICLES: A few small foci of subacute infarct are present in the right centrum semiovale and right parietal lobe, corresponding to infarcts seen on the MRI from 11/19/2023. Additional punctate infarcts in the right parietal lobe represent late acute versus subacute infarcts, which were seen on the earlier same day MRI but were not present on the 11/19/2023 study. No new areas of acute infarct are appreciated compared to the earlier same day MRI. Similar appearance of left temporal lobe and bilateral basal ganglia remote infarcts. Similar appearance of chronic microvascular ischemic changes and parenchymal volume loss. No significant edema. No acute intracranial hemorrhage. No mass effect or midline shift. No hydrocephalus. The sella is unremarkable. Normal flow  voids. Focal enhancement is present in the right parietal lobe at a site of infarct noted on the 11/19/2023 MRI, likely reflecting enhancement in the setting of subacute infarct seen on series 17, image 113. Similar susceptibility in the left temporal lobe. ORBITS: No acute abnormality. SINUSES: Mucosal thickening in the ethmoid sinuses. Trace fluid in the mastoid air cells. BONES AND SOFT TISSUES: Left-sided craniotomy changes. Normal bone marrow signal and enhancement. No acute soft tissue abnormality. IMPRESSION: 1. No new areas of acute infarct. 2. Subacute infarcts in the right centrum semiovale and right parietal lobe, with additional late  acute versus subacute punctate infarcts in the right parietal lobe. 3. Focal enhancement in the right parietal lobe at a site of infarct, likely reflecting enhancement in the setting of subacute infarct. 4. Similar appearance of left temporal lobe and bilateral basal ganglia remote infarcts. 5. Similar appearance of chronic microvascular ischemic changes and parenchymal volume loss. No significant edema or midline shift. Electronically signed by: Donnice Mania MD 12/03/2023 06:16 PM EST RP Workstation: [id number]   MR BRAIN WO CONTRAST Result Date: 12/03/2023 EXAM: MRI BRAIN WITHOUT CONTRAST 12/03/2023 12:42:12 PM TECHNIQUE: Multiplanar multisequence MRI of the head/brain was performed without the administration of intravenous contrast. COMPARISON: MRI head 11/19/2023 and earlier same day CTA head and neck. CLINICAL HISTORY: Neuro deficit, acute, stroke suspected. FINDINGS: BRAIN AND VENTRICLES: There are multiple small foci of diffusion signal abnormality in the right centrum semiovale and right parietal lobe corresponding to infarcts seen on the prior MRI; these infarcts are now subacute in appearance. There are a few additional punctate areas of infarct within the right parietal lobe which appear late acute versus subacute and were not seen on the prior MRI. These infarcts are noted on series 5 images 34 and 35. Encephalomalacia and gliosis in the left MCA territory primarily involving the left temporal lobe compatible with remote infarct. There are additional remote lacunar infarcts in the left basal ganglia and a more prominent remote lacunar infarct in the right lentiform nucleus. Scattered white matter signal abnormalities suggestive of mild chronic microvascular ischemic changes. There is mild age related volume loss. No intracranial hemorrhage. No mass. No midline shift. No hydrocephalus. The sella is unremarkable. Normal flow voids. ORBITS: No acute abnormality. SINUSES AND MASTOIDS: Mild mucosal  thickening in the right ethmoid sinus. Trace fluid in the mastoid air cells. BONES AND SOFT TISSUES: Normal marrow signal. No acute soft tissue abnormality. IMPRESSION: 1. Multiple small foci of diffusion signal abnormality in the right centrum semiovale and right parietal lobe corresponding to prior infarcts, now subacute in appearance. 2. A few additional punctate infarcts within the right parietal lobe, late acute versus subacute, not seen on the prior MRI. 3. Encephalomalacia and gliosis in the left MCA territory primarily involving the left temporal lobe, compatible with remote infarct. 4. Additional remote lacunar infarcts in the left basal ganglia and a more prominent remote lacunar infarct in the right lentiform nucleus. Electronically signed by: Donnice Mania MD 12/03/2023 01:19 PM EST RP Workstation: [id number]   CT ANGIO HEAD NECK W WO CM Result Date: 12/03/2023 EXAM: CTA HEAD AND NECK WITHOUT AND WITH 12/03/2023 12:03:53 PM TECHNIQUE: CTA of the head and neck was performed without and with the administration of 75 mL of iohexol  (OMNIPAQUE ) 350 MG/ML injection. Multiplanar 2D and/or 3D reformatted images are provided for review. Automated exposure control, iterative reconstruction, and/or weight based adjustment of the mA/kV was utilized to reduce the radiation dose to as low as reasonably achievable. Stenosis  of the internal carotid arteries measured using NASCET criteria. COMPARISON: MRI head 11/19/2023 CLINICAL HISTORY: Neuro deficit, acute, stroke suspected. FINDINGS: CTA NECK: AORTIC ARCH AND ARCH VESSELS: Mild atherosclerosis of the visualized aortic arch. The left vertebral artery originates on the aortic arch. No dissection or arterial injury. No significant stenosis of the brachiocephalic or subclavian arteries. CERVICAL CAROTID ARTERIES: The right carotid artery is patent from the origin to the skull base. Mild atherosclerosis at the right carotid bifurcation without hemodynamically  significant stenosis. The left carotid artery is patent from the origin to the skull base. No hemodynamically significant stenosis. No dissection or arterial injury. CERVICAL VERTEBRAL ARTERIES: The right vertebral artery is dominant. The left vertebral artery originates on the aortic arch. Vertebral arteries are patent from the origins to the vertebrobasilar confluence. No dissection, arterial injury, or significant stenosis. LUNGS AND MEDIASTINUM: Unremarkable. SOFT TISSUES: No acute abnormality. BONES: Degenerative changes in the visualized spine. Edentulous maxilla and mandible. Left frontotemporal craniotomy. Mild mucosal thickening in the ethmoid sinuses, right greater than left. CTA HEAD: ANTERIOR CIRCULATION: Intracranial internal carotid arteries are patent bilaterally. Atherosclerosis of the carotid siphons. There is focal moderate-to-severe narrowing at the left supraclinoid right ICA without significant calcified atherosclerosis at this level. Diminutive A1 segment of the left ACA which is likely congenital. There is irregularity and mild narrowing of the right A1 segment. The anterior cerebral arteries are patent bilaterally. There is focal severe stenosis of the proximal M1 segment of the left MCA. Additional irregularity of the distal left M1 segment. Diminutive caliber of multiple M2 and M3 branches of the left MCA likely related to prior infarct. There is focal severe stenosis of an M3 branch of the left MCA within the posterior aspect of the sylvian fissure. There is mild narrowing and distal tapering of the right M1 segment. There is moderate posterior stenosis of a proximal infarct branch of the right MCA. No aneurysm. POSTERIOR CIRCULATION: There is short segment moderate to severe stenosis of the anterior P2 segment of the right PCA. The posterior P2 segment of the right PCA is diminutive in caliber with irregularity and in multiple areas of moderate stenosis. Irregularity and moderate stenosis  of the P2 segment of the left PCA. No significant stenosis of the basilar artery. No significant stenosis of the vertebral arteries. No aneurysm. OTHER: Redemonstrated encephalomalacia in the left MCA territory primarily involving the left temporal lobe. Additional remote infarcts in the bilateral basal ganglia. There is mild ex vacuo dilatation of the left temporal horn. No midline shift. The basilar cisterns are patent. No dural venous sinus thrombosis on this non-dedicated study. IMPRESSION: 1. No acute large vessel occlusion. 2. Multifocal intracranial arterial stenoses, similar to prior. 3. Similar moderate-to-severe stenosis of the supraclinoid left ICA. 4. Focal severe stenosis of the proximal M1 segment of the left MCA with additional distal M1 irregularity and diminutive caliber of multiple M2 and M3 branches of the left MCA, likely related to prior infarct. 5. Focal severe stenosis of an M3 branch of the left MCA within the posterior aspect of the sylvian fissure. 6. Mild narrowing and distal tapering of the right M1 segment with moderate-to-severe stenosis of a proximal M3 branch. 7. Short segment moderate to severe stenosis of the anterior P2 segment of the right PCA, with diminutive caliber and irregularity of the posterior P2 segment. Irregularity with moderate stenosis of the left PCA P2 segment. Electronically signed by: Donnice Mania MD 12/03/2023 12:24 PM EST RP Workstation: [id number]  Labs:   Basic Metabolic Panel: Recent Labs  Lab 12/03/23 1027 12/04/23 0545 12/05/23 0421  NA 135 136 135  K 4.7 3.7 4.4  CL 98 102 100  CO2 27 26 24   GLUCOSE 357* 240* 349*  BUN 19 12 19   CREATININE 0.82 0.67 0.78  CALCIUM  9.5 8.9 9.6  MG  --   --  2.0   GFR Estimated Creatinine Clearance: 91.5 mL/min (by C-G formula based on SCr of 0.78 mg/dL). Liver Function Tests: Recent Labs  Lab 12/03/23 1552  AST 13*  ALT 12  ALKPHOS 127*  BILITOT 0.5  PROT 7.5  ALBUMIN 4.2   No results  for input(s): LIPASE, AMYLASE in the last 168 hours. No results for input(s): AMMONIA in the last 168 hours. Coagulation profile Recent Labs  Lab 12/03/23 1027  INR 1.0    CBC: Recent Labs  Lab 12/03/23 1027 12/04/23 0545 12/05/23 0421  WBC 8.9 6.8 7.2  NEUTROABS 6.7  --   --   HGB 12.8* 11.6* 12.4*  HCT 38.7* 34.1* 35.9*  MCV 82.3 80.2 79.2*  PLT 147* 126* 119*   Cardiac Enzymes: No results for input(s): CKTOTAL, CKMB, CKMBINDEX, TROPONINI in the last 168 hours. BNP: Invalid input(s): POCBNP CBG: Recent Labs  Lab 12/03/23 2136 12/04/23 0641 12/04/23 1704 12/04/23 2128  GLUCAP 298* 231* 245* 289*   D-Dimer No results for input(s): DDIMER in the last 72 hours. Hgb A1c No results for input(s): HGBA1C in the last 72 hours. Lipid Profile No results for input(s): CHOL, HDL, LDLCALC, TRIG, CHOLHDL, LDLDIRECT in the last 72 hours. Thyroid function studies No results for input(s): TSH, T4TOTAL, T3FREE, THYROIDAB in the last 72 hours.  Invalid input(s): FREET3 Anemia work up No results for input(s): VITAMINB12, FOLATE, FERRITIN, TIBC, IRON, RETICCTPCT in the last 72 hours. Microbiology Recent Results (from the past 240 hours)  CSF culture w Gram Stain     Status: None (Preliminary result)   Collection Time: 12/03/23  4:10 PM   Specimen: CSF; Cerebrospinal Fluid  Result Value Ref Range Status   Specimen Description   Final    CSF Performed at [facility], [address], [place], [place] [address]    Special Requests   Final    NONE Performed at [facility], [address], [place], [place] [address]    Gram Stain   Final    NO WBC SEEN NO ORGANISMS SEEN CYTOSPIN SMEAR Performed at [facility], [address] [place], [place] [address]    Culture   Final    NO GROWTH < 24 HOURS Performed at [facility], [address] [place], [place] [address]    Report Status PENDING  Incomplete  Anaerobic  culture w Gram Stain     Status: None (Preliminary result)   Collection Time: 12/03/23  4:11 PM   Specimen: CSF; Cerebrospinal Fluid  Result Value Ref Range Status   Specimen Description   Final    CSF Performed at [facility], [address] [place], [place] [address]    Special Requests   Final    NONE Performed at [facility], [address] [place], [place] [address]    Gram Stain   Final    NO WBC SEEN NO ORGANISMS SEEN CYTOSPIN SMEAR Performed at [facility], [address], [place], [place] [address]    Culture   Final    NO ANAEROBES ISOLATED; CULTURE IN PROGRESS FOR 5 DAYS   Report Status  PENDING  Incomplete     Discharge Instructions:   Discharge Instructions     Diet - low sodium heart healthy   Complete by: As directed    Diet Carb Modified   Complete by: As directed    Discharge instructions   Complete by: As directed    Follow-up with your primary care provider in 1 week.  You will need good control of diabetes as outpatient.  Continue medications as prescribed.  Seek medical attention for worsening symptoms.   Increase activity slowly   Complete by: As directed       Allergies as of 12/05/2023       Reactions   Morphine And Codeine     hallucinations        Medication List     STOP taking these medications    aspirin  81 MG chewable tablet       TAKE these medications    acetaminophen  500 MG tablet Commonly known as: TYLENOL  Take 500-1,000 mg by mouth every 6 (six) hours as needed for moderate pain (pain score 4-6).   butalbital -acetaminophen -caffeine  50-325-40 MG tablet Commonly known as: FIORICET  Take 1 tablet by mouth every 6 (six) hours as needed for headache.   cilostazol 100 MG tablet Commonly known as: PLETAL Take 1 tablet (100 mg total) by mouth 2 (two) times daily.   clopidogrel  75 MG tablet Commonly known as: PLAVIX  Take 1 tablet (75 mg total) by mouth daily.   cyclobenzaprine  5 MG tablet Commonly known as:  FLEXERIL  Take 1 tablet (5 mg total) by mouth 3 (three) times daily as needed for muscle spasms (neck pain).   insulin  glargine 100 UNIT/ML Solostar Pen Commonly known as: LANTUS  Inject 20 Units into the skin at bedtime.   lisinopril  10 MG tablet Commonly known as: ZESTRIL  Take 1 tablet (10 mg total) by mouth daily.   metFORMIN  500 MG tablet Commonly known as: GLUCOPHAGE  Take 1 tablet (500 mg total) by mouth 2 (two) times daily with a meal.   rosuvastatin  20 MG tablet Commonly known as: CRESTOR  Take 1 tablet (20 mg total) by mouth daily.        Follow-up Information     Myra Geni ORN, FNP Follow up in 1 week(s).   Specialty: Family Medicine Contact information: [address] [place] [address] [phone]                  Time coordinating discharge: 39 minutes  Signed:  Kaslyn [place]  [facility] 12/05/2023, 5:14 PM

## 2023-12-05 NOTE — Evaluation (Signed)
 Physical Therapy Evaluation Patient Details Name: Dustin Roberts MRN: [id number] DOB: Apr 01, 1955 Today's Date: 12/05/2023  History of Present Illness  68 yo male presents to ED 11/28 with headache, BLE weakness, pain in R groin. New punctate infarct R parietal. S/p occipital nerve block 11/29. PMH includes ruptured AVM s/p L craniotomy in 1997 with gamma knife in 2007 for residual over the L superior temporal gyrus with feeder from L MCA,  chronic headache, type 2 diabetes, CHF, COPD, GERD, HTN, CAD with MI in 2008, with recent admission between 11-13 to 11- 16 due to acute and subacute infarcts in the right parietal lobe and right centrum semiovale.  Clinical Impression   Pt presents with [facility] strength, balance (DGI 22/24 not at increased risk of falls), activity tolerance, sensation, stair navigation. Pt to benefit from acute PT to address deficits. Pt ambulated good hallway distance, mod I for increased time but no physical assist even with balance challenge. Pt endorsing feeling at baseline, and 0/10 headache pain since nerve block yesterday. PT reviewed BE FAST acronym with pt, pt expresses understanding. Pt with no further acute PT needs, PT to sign off and appropriate to d/c home from PT perspective.         If plan is discharge home, recommend the following:     Can travel by private vehicle        Equipment Recommendations None recommended by PT  Recommendations for Other Services       Functional Status Assessment Patient has not had a recent decline in their functional status     Precautions / Restrictions Precautions Precautions: Fall Restrictions Weight Bearing Restrictions Per Provider Order: No      Mobility  Bed Mobility Overal bed mobility: Independent                  Transfers Overall transfer level: Independent Equipment used: None                    Ambulation/Gait Ambulation/Gait assistance: Modified independent (Device/Increase  time) Gait Distance (Feet): 350 Feet Assistive device: None Gait Pattern/deviations: Step-through pattern, Decreased stride length, Trunk flexed, Antalgic Gait velocity: decr, anticipate baseline     General Gait Details: slightly increased time, antalgic gait given arthritic changes bilat LEs but anticipate baseline. Pt states he feels at baseline  Stairs Stairs: Yes Stairs assistance: Independent Stair Management: No rails Number of Stairs: 5    Wheelchair Mobility     Tilt Bed    Modified Rankin (Stroke Patients Only) Modified Rankin (Stroke Patients Only) Pre-Morbid Rankin Score: No significant disability Modified Rankin: No significant disability     Balance                                 Standardized Balance Assessment Standardized Balance Assessment : Dynamic Gait Index   Dynamic Gait Index Level Surface: Mild Impairment Change in Gait Speed: Normal Gait with Horizontal Head Turns: Normal Gait with Vertical Head Turns: Normal Gait and Pivot Turn: Normal Step Over Obstacle: Mild Impairment Step Around Obstacles: Normal Steps: Normal Total Score: 22       Pertinent Vitals/Pain Pain Assessment Pain Assessment: No/denies pain    Home Living Family/patient expects to be discharged to:: Private residence Living Arrangements: Spouse/significant other Available Help at Discharge: Family Type of Home: Mobile home Home Access: Stairs to enter   Entrance Stairs-Number of Steps: 2   Home Layout: One  level Home Equipment: Cane - single point      Prior Function Prior Level of Function : Independent/Modified Independent;Driving                     Extremity/Trunk Assessment   Upper Extremity Assessment Upper Extremity Assessment: Defer to OT evaluation    Lower Extremity Assessment Lower Extremity Assessment: Overall WFL for tasks assessed (5/5 throughout except for hip flexion 4/5, bilat and symmetric)    Cervical / Trunk  Assessment Cervical / Trunk Assessment: Normal;Other exceptions Cervical / Trunk Exceptions: cervical stiffness, pt endorsing due to arthritic changes  Communication   Communication Communication: Impaired Factors Affecting Communication: Hearing impaired    Cognition Arousal: Alert Behavior During Therapy: WFL for tasks assessed/performed, Lability   PT - Cognitive impairments: No apparent impairments                       PT - Cognition Comments: pt intermittently tearful, states I can't stop crying Following commands: Intact       Cueing Cueing Techniques: Verbal cues     General Comments      Exercises     Assessment/Plan    PT Assessment Patient does not need any further PT services  PT Problem List         PT Treatment Interventions      PT Goals (Current goals can be found in the Care Plan section)  Acute Rehab PT Goals Patient Stated Goal: home PT Goal Formulation: All assessment and education complete, DC therapy Time For Goal Achievement: 12/05/23 Potential to Achieve Goals: Good    Frequency       Co-evaluation               AM-PAC PT 6 Clicks Mobility  Outcome Measure Help needed turning from your back to your side while in a flat bed without using bedrails?: None Help needed moving from lying on your back to sitting on the side of a flat bed without using bedrails?: None Help needed moving to and from a bed to a chair (including a wheelchair)?: None Help needed standing up from a chair using your arms (e.g., wheelchair or bedside chair)?: None Help needed to walk in hospital room?: None Help needed climbing 3-5 steps with a railing? : None 6 Click Score: 24    End of Session   Activity Tolerance: Patient tolerated treatment well Patient left: in bed;with call bell/phone within reach Nurse Communication: Mobility status PT Visit Diagnosis: Unsteadiness on feet (R26.81)    Time: 8967-8953 PT Time Calculation (min)  (ACUTE ONLY): 14 min   Charges:   PT Evaluation $PT Eval Low Complexity: 1 Low   PT General Charges $$ ACUTE PT VISIT: 1 Visit         Johana RAMAN, PT DPT Acute Rehabilitation Services Secure Chat Preferred  Office [phone]   Penda Venturi E Stroup 12/05/2023, 11:10 AM

## 2023-12-06 LAB — HSV DNA BY PCR (REFERENCE LAB)
HSV 1 DNA: NEGATIVE
HSV 2 DNA: NEGATIVE

## 2023-12-06 LAB — PROTEIN ELECTROPHORESIS, SERUM
A/G Ratio: 1 (ref 0.7–1.7)
Albumin ELP: 3.3 g/dL (ref 2.9–4.4)
Alpha-1-Globulin: 0.3 g/dL (ref 0.0–0.4)
Alpha-2-Globulin: 1.1 g/dL — ABNORMAL HIGH (ref 0.4–1.0)
Beta Globulin: 1.2 g/dL (ref 0.7–1.3)
Gamma Globulin: 0.9 g/dL (ref 0.4–1.8)
Globulin, Total: 3.4 g/dL (ref 2.2–3.9)
Total Protein ELP: 6.7 g/dL (ref 6.0–8.5)

## 2023-12-06 LAB — VDRL, CSF: VDRL Quant, CSF: NONREACTIVE

## 2023-12-07 LAB — CSF CULTURE W GRAM STAIN
Culture: NO GROWTH
Gram Stain: NONE SEEN

## 2023-12-07 LAB — ANCA TITERS
Atypical P-ANCA titer: <1:20 {titer}
C-ANCA: <1:20 {titer}
P-ANCA: <1:20 {titer}

## 2023-12-07 LAB — BARTONELLA ANTIBODY PANEL
B Quintana IgM: NEGATIVE {titer}
B henselae IgG: NEGATIVE {titer}
B henselae IgM: NEGATIVE {titer}
B quintana IgG: NEGATIVE {titer}

## 2023-12-07 LAB — QUANTIFERON-TB GOLD PLUS: QuantiFERON-TB Gold Plus: NEGATIVE

## 2023-12-07 LAB — QUANTIFERON-TB GOLD PLUS (RQFGPL)
QuantiFERON Mitogen Value: >10 [IU]/mL
QuantiFERON Nil Value: 3.26 [IU]/mL
QuantiFERON TB1 Ag Value: 0.29 [IU]/mL
QuantiFERON TB2 Ag Value: 0.34 [IU]/mL

## 2023-12-07 LAB — CRYOGLOBULIN

## 2023-12-08 LAB — ANAEROBIC CULTURE W GRAM STAIN: Gram Stain: NONE SEEN

## 2023-12-08 LAB — CYTOLOGY - NON PAP

## 2023-12-14 ENCOUNTER — Inpatient Hospital Stay: Admitting: Neurology

## 2023-12-29 ENCOUNTER — Telehealth: Payer: Self-pay

## 2023-12-29 ENCOUNTER — Other Ambulatory Visit (HOSPITAL_COMMUNITY): Payer: Self-pay

## 2023-12-29 ENCOUNTER — Ambulatory Visit: Admitting: Adult Health

## 2023-12-29 ENCOUNTER — Encounter: Payer: Self-pay | Admitting: Adult Health

## 2023-12-29 VITALS — BP 118/80 | HR 113 | Ht 71.0 in | Wt 173.6 lb

## 2023-12-29 DIAGNOSIS — M25512 Pain in left shoulder: Secondary | ICD-10-CM

## 2023-12-29 DIAGNOSIS — M5412 Radiculopathy, cervical region: Secondary | ICD-10-CM | POA: Diagnosis not present

## 2023-12-29 DIAGNOSIS — F482 Pseudobulbar affect: Secondary | ICD-10-CM | POA: Diagnosis not present

## 2023-12-29 DIAGNOSIS — I639 Cerebral infarction, unspecified: Secondary | ICD-10-CM | POA: Diagnosis not present

## 2023-12-29 DIAGNOSIS — G8929 Other chronic pain: Secondary | ICD-10-CM | POA: Diagnosis not present

## 2023-12-29 DIAGNOSIS — M5481 Occipital neuralgia: Secondary | ICD-10-CM | POA: Diagnosis not present

## 2023-12-29 DIAGNOSIS — Z09 Encounter for follow-up examination after completed treatment for conditions other than malignant neoplasm: Secondary | ICD-10-CM

## 2023-12-29 MED ORDER — NUEDEXTA 20-10 MG PO CAPS: 1.0000 | ORAL_CAPSULE | Freq: Two times a day (BID) | ORAL | 11 refills | Status: AC

## 2023-12-29 MED ORDER — GABAPENTIN 300 MG PO CAPS: 300.0000 mg | ORAL_CAPSULE | Freq: Three times a day (TID) | ORAL | 0 refills | Status: AC | PRN

## 2023-12-29 NOTE — Telephone Encounter (Signed)
 Pharmacy Patient Advocate Encounter  Received notification from [employer] that Prior Authorization for Nuedexta  has been APPROVED from 12/29/2023 to 01/04/2025. Ran test claim, Copay is $0. This test claim was processed through [facility]- copay amounts may vary at other pharmacies due to pharmacy/plan contracts, or as the patient moves through the different stages of their insurance plan.   PA #/Case ID/Reference #: [id number]

## 2023-12-29 NOTE — Progress Notes (Addendum)
 " [facility] [address] [place].  [address] [phone]       HOSPITAL FOLLOW UP NOTE  Mr. CANDICE LUNNEY Date of Birth:  01-Nov-1955 Medical Record Number:  [id number]   Reason for Referral:  hospital stroke follow up    SUBJECTIVE:   CHIEF COMPLAINT:  Chief Complaint  Patient presents with   RM 8     Patient is here with his wife for a hospital follow-up for stroke - his head hurts and right shoulder. He also has leg weakness.     HPI:   Mr. RISHIK TUBBY is a 68 y.o. male with history of AVM rupture prior to 2007, hypertension, hyperlipidemia, diabetes and GERD who presented to ED on 11/18/2023 with a 2-week history of headache which was gradual in onset and fluctuating in severity, sometimes reaching 10/10 intensity and sharp in nature.  Stroke workup showed small acute and subacute infarcts in the right parietal lobe and right centrum semiovale likely due to intracranial stenosis, suspected incidental finding.  MRI C-spine showed multilevel cervical spondylosis with the most severe at 5-6, moderate right neuroforaminal narrowing at C3-4 and moderate bilateral neuroforaminal narrowing at C6-7.  Recommended conservative management.  In regards to further stroke workup, CTA head/neck showed multifocal stenosis as noted below.  LDL 135.  A1c 12.1.  Recommended DAPT for 3 months then aspirin  alone and initiated Crestor  20 mg daily.  Advised close PCP follow-up for aggressive stroke risk factor management.  No therapy needs identified.  He returned to ED on 12/03/2023 for persistent headache. Repeat MRI brain showed right parietal infarct new from recent admission most likely secondary to microvascular inflammation related to uncontrolled diabetes although embolic stroke vs intracranial stenosis in DDx, low suspicion for CNS vasculitis based on CSF panel.  Reported compliance with DAPT and switched to Plavix  and Pletal  with noting effects on reducing oxidative  stress and vascular tissues.  Persistent occipital headache suspected due to occipital neuralgia and received nerve block.  No therapy needs identified.   Today, 12/29/2023, patient is being seen for initial hospital follow-up accompanied by his wife.  He reports continued occipital headache although overall improved compared to last month.  He reports headaches can occur a couple times per day, still bilateral occipital area and radiate towards the vertex.  He is unsure how long episodes last for.  Great benefit for 1 to 2 days after nerve block but symptoms gradually return.  Continued neck pain with limited range of motion radiating into left shoulder.  Apparently, he was not seeing any neck or shoulder pain until last month.  He feels at times his lower extremities are weak and his gait is impaired. Per wife, previously very active but has not been able to do much as he is fearful of falling. Thankfully, he has not had any falls, ambulates without AD.  Chronic lower extremity neuropathy likely from uncontrolled diabetes.  Previously on gabapentin  but self discontinued 1 to 2 years ago for no clear reason, apparently he stopped all of his medications around that time.  Wife also mentions having random episodes of crying since his stroke, he feels it is difficult to control, he does have some depression symptoms but at times will feel okay and start to cry.  No new stroke/TIA symptoms.  He remains on Plavix  and Pletal  as well as Crestor  without side effects.  Blood pressure well-controlled.  Reports compliance with diabetic medications but only checks glucose levels when feeling unwell.  He has follow-up visit with PCP on 1/7.  No further questions or concerns at this time.       PERTINENT IMAGING  CT head No acute abnormality.  CTA head & neck Moderate to severe stenoses about supraclinoid left ICA, proximal left M1 segment, right MCA bifurcation/proximal M2 segment and bilateral P2 segments,  possibly reflecting changes of vasculitis or RCVS versus progressive atherosclerotic disease  MRI 11/13 Small foci of acute and subacute infarcts in superior right parietal lobe and right centrum semiovale  MRI 11/28 Multiple small foci of diffusion signal abnormality in the right centrum semiovale and right parietal lobe corresponding to prior infarcts, now subacute in appearance.  CSF WBC 1, RBC 12, protein 60, glucose 146 - not consistent with CNS vasculitis and not candidate for steroid treatment given uncontrolled diabetes Autoimmune labs ribonucleoprotein 2.3, SSA 1.1 2D Echo EF 60-65% LDL 135 HgbA1c 12.1    ROS:   14 system review of systems performed and negative with exception of those listed in HPI  PMH:  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   History of kidney stones    HOH (hard of hearing)    Hyperlipidemia    Hypertension    IDA (iron deficiency anemia)    Myocardial infarction (HCC)    2008   Peptic ulcer disease    Seizures (HCC)    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 knife surgery 2008   Sleep apnea    Stroke [facility])    Due to brain aneurysm    PSH:  Past Surgical History:  Procedure Laterality Date   APPENDECTOMY     BIOPSY  05/27/2016   Procedure: BIOPSY;  Surgeon: Shaaron Lamar HERO, MD;  [facility];  Service: Endoscopy;;  Duodenal, esophagus   BRAIN SURGERY     aneurysm, age 64   CEREBRAL ANEURYSM REPAIR     Second procedure was gamma knife repair   COLONOSCOPY WITH PROPOFOL  N/A 05/27/2016   Procedure: COLONOSCOPY WITH PROPOFOL ;  Surgeon: Shaaron Lamar HERO, MD;  [facility];  Service: Endoscopy;  Laterality: N/A;  1230    ESOPHAGOGASTRODUODENOSCOPY (EGD) WITH PROPOFOL  N/A 05/27/2016   Procedure:  ESOPHAGOGASTRODUODENOSCOPY (EGD) WITH PROPOFOL ;  Surgeon: Shaaron Lamar HERO, MD;  [facility];  Service: Endoscopy;  Laterality: N/A;   FRACTURE SURGERY     left wrist   GIVENS CAPSULE STUDY N/A 06/08/2016   Procedure: GIVENS CAPSULE STUDY;  Surgeon: Shaaron Lamar HERO, MD;  [facility];  Service: Endoscopy;  Laterality: N/A;  7:30am. Patient to arrive at 7:00am   HERNIA REPAIR     left inguinal and umbilical   KNEE SURGERY Left    twice   POLYPECTOMY  05/27/2016   Procedure: POLYPECTOMY;  Surgeon: Shaaron Lamar HERO, MD;  [facility];  Service: Endoscopy;;  recto-sigmoid polyp   ROTATOR CUFF REPAIR Left    x2    Social History:  Social History   Socioeconomic History   Marital status: Legally Separated    Spouse name: Not on file   Number of children: Not on file   Years of education: Not on file   Highest education level: Not on file  Occupational History  Not on file  Tobacco Use   Smoking status: Never   Smokeless tobacco: Never  Vaping Use   Vaping status: Never Used  Substance and Sexual Activity   Alcohol use: No   Drug use: No   Sexual activity: Yes    Birth control/protection: None  Other Topics Concern   Not on file  Social History Narrative   No caffeine     Social Drivers of Health   Tobacco Use: Low Risk (12/29/2023)   Patient History    Smoking Tobacco Use: Never    Smokeless Tobacco Use: Never    Passive Exposure: Not on file  Financial Resource Strain: Not on file  Food Insecurity: No Food Insecurity (12/04/2023)   Epic    Worried About Programme Researcher, Broadcasting/film/video in the Last Year: Never true    Ran Out of Food in the Last Year: Never true  Transportation Needs: No Transportation Needs (12/04/2023)   Epic    Lack of Transportation (Medical): No    Lack of Transportation (Non-Medical): No  Physical Activity: Not on file  Stress: Not on file  Social Connections: Unknown (12/04/2023)   Social Connection and Isolation Panel     Frequency of Communication with Friends and Family: Three times a week    Frequency of Social Gatherings with Friends and Family: Not on file    Attends Religious Services: Patient declined    Active Member of Clubs or Organizations: Patient declined    Attends Banker Meetings: Patient declined    Marital Status: Living with partner  Intimate Partner Violence: Unknown (12/04/2023)   Epic    Fear of Current or Ex-Partner: No    Emotionally Abused: Not on file    Physically Abused: No    Sexually Abused: No  Depression (PHQ2-9): Not on file  Alcohol Screen: Not on file  Housing: Low Risk (12/04/2023)   Epic    Unable to Pay for Housing in the Last Year: No    Number of Times Moved in the Last Year: 0    Homeless in the Last Year: No  Utilities: Not At Risk (12/04/2023)   Epic    Threatened with loss of utilities: No  Health Literacy: Not on file    Family History:  Family History  Problem Relation Age of Onset   Colon cancer Neg Hx    Migraines Neg Hx    Seizures Neg Hx    Stroke Neg Hx     Medications:  Medications Ordered Prior to Encounter[1]  Allergies:  Allergies[2]    OBJECTIVE:  Physical Exam  Vitals:   12/29/23 1020  BP: 118/80  Pulse: (!) 113  Weight: 173 lb 9.6 oz (78.7 kg)  Height: 5' 11 (1.803 m)   Body mass index is 24.21 kg/m. No results found.  General: Frail pleasant elderly Caucasian male, seated, in no evident distress initially but towards end of visit, random episodes of crying Musculoskeletal: limited cervical ROM; tenderness to palpation over bilateral occipital nerves  Neurologic Exam Mental Status: Awake and fully alert.  No evidence of aphasia or dysarthria.  Oriented to place and time. Recent and remote memory intact. Attention span, concentration and fund of knowledge appeared appropriate although wife provided majority of history. Mood and affect appropriate.  Cranial Nerves: Fundoscopic exam reveals sharp disc  margins. Pupils equal, briskly reactive to light. Extraocular movements full without nystagmus. Visual fields full to confrontation.  HOH bilaterally. Facial sensation intact. Face, tongue, palate moves normally and symmetrically.  Motor: Normal bulk and tone. Normal strength in all tested extremity muscles Coordination: Rapid alternating movements normal in all extremities. Finger-to-nose and heel-to-shin performed accurately bilaterally. Gait and Station: Arises from chair without difficulty. Stance is slightly hunched. Gait demonstrates slow cautious gait with mild imbalance without use of AD Reflexes: 1+ and symmetric. Toes downgoing.          ASSESSMENT: ERSEL ENSLIN is a 68 y.o. year old male with incidental finding of small acute and subacute infarcts in the right parietal lobe and right centrum semiovale on 11/18/2023 likely due to intracranial stenosis after presenting with headache. Due to persistent headache, returned to ED on 11/28 with MRI showing right parietal infarct new from recent admission suspected due to microvascular inflammation related uncontrolled diabetes, workup for CNS vasculitis negative.  Suspected headaches in setting of occipital neuralgia and received nerve block.  Vascular risk factors include uncontrolled DM, HLD, HTN, hx of AVM s/p resection 1997 with gamma knife in 2007, chronic headaches, intracranial stenosis and advanced age.      PLAN:  Recurrent ischemic strokes:  Unclear if gait impairment post stroke vs  due to chronic neuropathy possibly worsening vs arthritic pain. 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 CNS-LS 19. Start Nuedexta  1 tab daily for 7 days then increase to twice daily. Advised to follow up with PCP if depressive symptoms persist.  Continue Plavix  and Pletal  100mg  BID and rosuvastatin  (Crestor ) for secondary stroke  prevention managed/prescribed by PCP.  Will follow up with Dr. Rosemarie regarding ongoing duration of DAPT ADDENDUM 01/10/2024 JM:  per Dr. Rosemarie, recommends long-term DAPT with both Plavix  and Pletal .  Patient will be contacted to inform him of this.  Discussed secondary stroke prevention measures and importance of close PCP follow up for aggressive stroke risk factor management including BP goal<130/90, HLD with LDL goal<70 and DM with A1c.<7 .  Stroke labs 11/2023: LDL 135, A1c 12.1 I have gone over the pathophysiology of stroke, warning signs and symptoms, risk factors and their management in some detail with instructions to go to the closest emergency room for symptoms of concern.  Occipital neuralgia: Cervical radiculopathy: Start gabapentin  with gradual increase to 300mg  TID PRN until further evaluation with ortho Noted benefit with bilateral nerve block for 1-2 days MRI C-spine 11/2023 multilevel cervical spondylosis with most severe involvement at C5-6 for severe left neuroforaminal narrowing is present.  At C3-4, moderate right neuroforaminal narrowing.  At C6-7, moderate bilateral neuroforaminal narrowing Refer to orthopedics Dr. Eldonna for further treatment options      Follow up in 3-4 months or call earlier if needed   CC:  [facility] provider: Dr. Rosemarie PCP: Myra Geni ORN, FNP    I personally spent a total of 70 minutes in the care of the patient today including preparing to see the patient, getting/reviewing separately obtained history, performing a medically appropriate exam/evaluation, counseling and educating, placing orders, referring and communicating with other health care professionals, and documenting clinical information in the EHR.  Harlene Bogaert, AGNP-BC  [facility] [address] [place], [place] [address]  Phone [phone] Fax [phone] Note: This document was prepared with digital dictation and possible smart phrase technology.  Any transcriptional errors that result from this process are unintentional.         [1]  Current Outpatient Medications on File Prior to Visit  Medication Sig Dispense Refill  acetaminophen  (TYLENOL ) 500 MG tablet Take 500-1,000 mg by mouth every 6 (six) hours as needed for moderate pain (pain score 4-6).     butalbital -acetaminophen -caffeine  (FIORICET ) 50-325-40 MG tablet Take 1 tablet by mouth every 6 (six) hours as needed for headache. 14 tablet 0   cilostazol  (PLETAL ) 100 MG tablet Take 1 tablet (100 mg total) by mouth 2 (two) times daily. 60 tablet 2   clopidogrel  (PLAVIX ) 75 MG tablet Take 1 tablet (75 mg total) by mouth daily. 90 tablet 0   cyclobenzaprine  (FLEXERIL ) 5 MG tablet Take 1 tablet (5 mg total) by mouth 3 (three) times daily as needed for muscle spasms (neck pain). 30 tablet 0   insulin  glargine (LANTUS ) 100 UNIT/ML Solostar Pen Inject 20 Units into the skin at bedtime. 18 mL 0   lisinopril  (ZESTRIL ) 10 MG tablet Take 1 tablet (10 mg total) by mouth daily. 30 tablet 2   metFORMIN  (GLUCOPHAGE ) 500 MG tablet Take 1 tablet (500 mg total) by mouth 2 (two) times daily with a meal. 60 tablet 2   rosuvastatin  (CRESTOR ) 20 MG tablet Take 1 tablet (20 mg total) by mouth daily. 30 tablet 2   No current facility-administered medications on file prior to visit.  [2]  Allergies Allergen Reactions   Morphine And Codeine      hallucinations   "

## 2023-12-29 NOTE — Patient Instructions (Addendum)
 Your Plan:  Continue Plavix  and Pletal  at this time. Will follow up with Dr. Rosemarie regarding ongoing duration of both Plavix  and Pletal . Continue Crestor  20mg  daily for stroke prevention. Ongoing refills to be completed by PCP  Follow-up with PCP next month as scheduled for ongoing monitoring and management of stroke risk factors  Suspect your frequent episodes of crying are possibly related to a condition called pseudobulbar affect which can occur after stroke.  Recommend you start Nuedexta  1 tab once daily for 7 days then increase to twice daily. If symptoms persist or no improvement of mood, please follow up with your PCP for treatment of depression  Referral placed for home health physical therapy to help with general strengthening and gait   Start gabapentin  gradually to 300mg  three times daily to help with occipital neuralgia while you are waiting to see Dr. Eldonna for further treatment options     Follow-up in 3 to 4 months or call earlier if needed      Thank you for coming to see us  at [facility]. I hope we have been able to provide you high quality care today.  You may receive a patient satisfaction survey over the next few weeks. We would appreciate your feedback and comments so that we may continue to improve ourselves and the health of our patients.

## 2023-12-29 NOTE — Telephone Encounter (Signed)
 Clinical questions have been answered and PA submitted. PA currently Pending. Please be advised that most companies allow up to 30 days to make a decision. We will advise when a determination has been made, or follow up in 1 week.   Please reach out to our team, Rx Prior Auth Pool, if you haven't heard back in a week.

## 2023-12-29 NOTE — Telephone Encounter (Signed)
 Pharmacy Patient Advocate Encounter   Received notification from [employer] that prior authorization for Nuedexta  is required/requested.   Insurance verification completed.   The patient is insured through [employer].   Per test claim: PA required; PA started via CoverMyMeds. KEY [id number] . Waiting for clinical questions to populate.

## 2024-01-05 NOTE — Progress Notes (Signed)
 I agree with the above plan

## 2024-01-10 ENCOUNTER — Telehealth: Payer: Self-pay | Admitting: Adult Health

## 2024-01-10 NOTE — Progress Notes (Signed)
Spoke with patient's wife and informed her.

## 2024-01-10 NOTE — Telephone Encounter (Signed)
 [facility] is going to take him.

## 2024-01-13 ENCOUNTER — Ambulatory Visit: Admitting: Physical Medicine and Rehabilitation

## 2024-01-13 ENCOUNTER — Encounter: Payer: Self-pay | Admitting: Physical Medicine and Rehabilitation

## 2024-01-13 DIAGNOSIS — M5481 Occipital neuralgia: Secondary | ICD-10-CM | POA: Diagnosis not present

## 2024-01-13 DIAGNOSIS — G4486 Cervicogenic headache: Secondary | ICD-10-CM | POA: Diagnosis not present

## 2024-01-13 DIAGNOSIS — M542 Cervicalgia: Secondary | ICD-10-CM

## 2024-01-13 NOTE — Progress Notes (Signed)
 Pain Scale   Average Pain 6 Patient advising he has neck pain that increases when moving his arms        +Driver, -BT, -Dye Allergies.

## 2024-01-13 NOTE — Progress Notes (Signed)
 "  Dustin Roberts - 69 y.o. male MRN [id number]  Date of birth: [birth year]  Office Visit Note: Visit Date: 01/13/2024 PCP: Myra Geni ORN, FNP Referred by: Myra Geni ORN, FNP  Subjective: Chief Complaint  Patient presents with   Neck - Pain   HPI: Dustin Roberts is a 69 y.o. male who comes in today per the request of Dustin Bogaert, NP for evaluation of chronic, worsening and severe bilateral neck pain radiating up to occipital regions. Also reports associated headaches. Wife reports history of headaches for several years, this neck pain started after stroke in November of 2025.   Patient is very hard of hearing, wife is accompanying him during our visit today. Pain ongoing for several months, worsens with side to side rotation. Describes as sore and tight sensation, currently rates as 8 out of 10. Some relief of pain with home exercise regimen, rest and use of medications. No history of dedicated physical therapy for his neck. He does take Gabapentin  TID. Recent cervical MRI imaging shows multi level degenerative changes, there is severe sided foraminal narrowing at C5-C6. No high grade spinal canal stenosis. He underwent occipital blocks on 11/30/2023 while hospitalized, he reports good relief of pain with these injections for several days. Patient denies recent trauma or falls. He is currently using cane to assist with ambulation.   Patients course is complicated by AVM, embolic stroke and diabetes mellitus.      Review of Systems  Musculoskeletal:  Positive for myalgias and neck pain.  Neurological:  Positive for headaches. Negative for sensory change, focal weakness and weakness.  All other systems reviewed and are negative.  Otherwise per HPI.  Assessment & Plan: Visit Diagnoses:    ICD-10-CM   1. Cervicalgia  M54.2 Ambulatory referral to Physical Medicine Rehab    CANCELED: Ambulatory referral to Physical Medicine Rehab    2. Bilateral occipital neuralgia  M54.81 Ambulatory  referral to Physical Medicine Rehab    CANCELED: Ambulatory referral to Physical Medicine Rehab    3. Cervicogenic headache  G44.86 Ambulatory referral to Physical Medicine Rehab    CANCELED: Ambulatory referral to Physical Medicine Rehab       Plan: Findings:  Chronic, worsening and severe bilateral neck pain radiating up to occipital regions. Patient continues to have severe pain despite good conservative therapies such as home exercise regimen, rest and use of medications. Recent cervical MRI imaging does show multi level degenerative changes, facet arthropathy and foraminal narrowing. No severe central canal stenosis. Patients clinical presentation and exam are consistent with occipital neuralgia. Good relief of pain with recent occipital nerve blocks. We discussed treatment plan in detail today. Next step is to perform bilateral C2-C3 medial branch blocks (third occipital nerve blocks). If good relief of diagnostic blocks we discussed possibility of longer sustained pain relief with radiofrequency ablation. I discussed double block paradigm with patient and wife, also discussed ablation procedure in detail. We will see the patient back for cervical injections. There is no weakness to bilateral upper extremities. Limited range of motion to neck today. He does have severe pain with side to side rotation.     Meds & Orders: No orders of the defined types were placed in this encounter.   Orders Placed This Encounter  Procedures   Ambulatory referral to Physical Medicine Rehab    Follow-up: Return for Bilateral C2-C3 medial branch blocks.   Procedures: No procedures performed      Clinical History: EXAM: MRI CERVICAL SPINE WITHOUT CONTRAST  11/19/2023 08:50:48 AM   TECHNIQUE: Multiplanar multisequence MRI of the cervical spine was performed.   COMPARISON: Cervical spine CT 03/30/22.   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 Moderate degenerative changes of the C1-C2 articulation. Normal vertebral body heights. Bone marrow signal is unremarkable.   SPINAL CORD: Normal spinal cord size. No abnormal spinal cord signal.   SOFT TISSUES: No paraspinal mass.   C2-C3: Congenital nonsegmentation.   C3-C4: Right greater than left facet arthropathy and uncovertebral joint spurring result in moderate right neural foraminal narrowing.   C4-C5: Mild bilateral facet arthropathy.   C5-C6: Moderate disc height loss, small disc bulge without significant spinal canal stenosis. Left greater than right facet arthropathy and uncovertebral joint spurring result in severe left neural foraminal narrowing.   C6-C7: Moderate disc height loss, small disc bulge without significant spinal canal stenosis. Facet arthropathy and uncovertebral joint spurring contribute to moderate bilateral neural foraminal narrowing.   C7-T1: Right greater than left facet arthropathy. No spinal canal stenosis or neural foraminal narrowing.   IMPRESSION: 1. Multilevel cervical spondylosis with the most severe involvement at C5-6, where severe left neural foraminal narrowing is present. 2. At C3-4, moderate right neural foraminal narrowing. 3. At C6-7, moderate bilateral neural foraminal narrowing.   Electronically signed by: Ryan Chess MD 11/19/2023 09:04 AM EST RP Workstation: [id number]   He reports that he has never smoked. He has never used smokeless tobacco.  Recent Labs    11/18/23 2050  HGBA1C 12.1*    Objective:  VS:  HT:    WT:   BMI:     BP:   HR: bpm  TEMP: ( )  RESP:  Physical Exam Vitals and nursing note reviewed.  HENT:     Head: Normocephalic and atraumatic.     Right Ear: External ear normal.     Left Ear: External ear normal.     Nose: Nose normal.     Mouth/Throat:     Mouth: Mucous membranes are moist.  Eyes:     Pupils:  Pupils are equal, round, and reactive to light.  Cardiovascular:     Rate and Rhythm: Normal rate.     Pulses: Normal pulses.  Pulmonary:     Effort: Pulmonary effort is normal.  Abdominal:     General: Abdomen is flat. There is no distension.  Musculoskeletal:        General: Tenderness present.     Cervical back: Tenderness present.     Comments: Discomfort noted with flexion, extension and side-to-side rotation. Patient has good strength in the upper extremities including 5 out of 5 strength in wrist extension, long finger flexion and APB. Shoulder range of motion is full bilaterally without any sign of impingement. There is no atrophy of the hands intrinsically. Sensation intact bilaterally. Myofascial tenderness noted upon palpation of bilateral cervical paraspinal regions. Negative Hoffman's sign. Negative Spurling's sign.   He is using cane to assist with ambulation, gait slow and unsteady.     Skin:    General: Skin is warm and dry.     Capillary Refill: Capillary refill takes less than 2 seconds.  Neurological:     Mental Status: He is alert and oriented to person, place, and time.     Gait: Gait abnormal.  Psychiatric:        Mood and Affect: Mood normal.  Behavior: Behavior normal.     Ortho Exam  Imaging: No results found.  Past Medical/Family/Surgical/Social History: Medications & Allergies reviewed per EMR, new medications updated. Patient Active Problem List   Diagnosis Date Noted   Embolic stroke (HCC) 12/03/2023   Stroke (cerebrum) (HCC) 11/20/2023   Headache 11/19/2023   Uncontrolled type 2 diabetes mellitus with hyperglycemia, with long-term current use of insulin  (HCC) 11/19/2023   Non-adherence to medical treatment 07/09/2021   DM type 2 causing vascular disease (HCC) 11/12/2020   Mixed hyperlipidemia 11/12/2020   Essential hypertension, benign 11/12/2020   H/O type 2 diabetes mellitus 11/06/2016   Nausea with vomiting 06/16/2016   IDA (iron  deficiency anemia) 05/25/2016   GERD (gastroesophageal reflux disease) 05/25/2016   Left sided abdominal pain 05/25/2016   Diarrhea 05/25/2016   AVM (arteriovenous malformation) 08/21/2013   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   History of kidney stones    HOH (hard of hearing)    Hyperlipidemia    Hypertension    IDA (iron deficiency anemia)    Myocardial infarction (HCC)    2008   Peptic ulcer disease    Seizures (HCC)    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 knife surgery 2008   Sleep apnea    Stroke [facility])    Due to brain aneurysm   Family History  Problem Relation Age of Onset   Colon cancer Neg Hx    Migraines Neg Hx    Seizures Neg Hx    Stroke Neg Hx    Past Surgical History:  Procedure Laterality Date   APPENDECTOMY     BIOPSY  05/27/2016   Procedure: BIOPSY;  Surgeon: Shaaron Lamar HERO, MD;  Location: [facility];  Service: Endoscopy;;  Duodenal, esophagus   BRAIN SURGERY     aneurysm, age 46   CEREBRAL ANEURYSM REPAIR     Second procedure was gamma knife repair   COLONOSCOPY WITH PROPOFOL  N/A 05/27/2016   Procedure: COLONOSCOPY WITH PROPOFOL ;  Surgeon: Shaaron Lamar HERO, MD;  Location: [facility];  Service: Endoscopy;  Laterality: N/A;  1230    ESOPHAGOGASTRODUODENOSCOPY (EGD) WITH PROPOFOL  N/A 05/27/2016   Procedure: ESOPHAGOGASTRODUODENOSCOPY (EGD) WITH PROPOFOL ;  Surgeon: Shaaron Lamar HERO, MD;  Location: [facility];  Service: Endoscopy;  Laterality: N/A;   FRACTURE SURGERY     left wrist   GIVENS CAPSULE STUDY N/A 06/08/2016   Procedure: GIVENS CAPSULE STUDY;  Surgeon: Shaaron Lamar HERO, MD;  Location: [facility];  Service: Endoscopy;  Laterality: N/A;  7:30am. Patient to arrive at 7:00am   HERNIA REPAIR     left  inguinal and umbilical   KNEE SURGERY Left    twice   POLYPECTOMY  05/27/2016   Procedure: POLYPECTOMY;  Surgeon: Shaaron Lamar HERO, MD;  Location: [facility];  Service: Endoscopy;;  recto-sigmoid polyp   ROTATOR CUFF REPAIR Left    x2   Social History   Occupational History   Not on file  Tobacco Use   Smoking status: Never   Smokeless tobacco: Never  Vaping Use   Vaping status: Never Used  Substance and Sexual Activity   Alcohol use: No   Drug use: No   Sexual activity: Yes  Birth control/protection: None    "

## 2024-01-20 NOTE — Telephone Encounter (Signed)
 Signed Home Health Certification and Plan of Care faxed to [facility] 01/20/2024

## 2024-02-28 ENCOUNTER — Encounter: Admitting: Orthopedic Surgery

## 2024-04-18 ENCOUNTER — Ambulatory Visit: Admitting: Adult Health
